# Patient Record
Sex: Male | Born: 1940
Health system: Southern US, Community
[De-identification: ages and names within clinical notes are randomized; demographics above are authoritative.]

## PROBLEM LIST (undated history)

## (undated) DIAGNOSIS — I2510 Atherosclerotic heart disease of native coronary artery without angina pectoris: Secondary | ICD-10-CM

## (undated) DIAGNOSIS — I340 Nonrheumatic mitral (valve) insufficiency: Secondary | ICD-10-CM

## (undated) DIAGNOSIS — I10 Essential (primary) hypertension: Secondary | ICD-10-CM

## (undated) DIAGNOSIS — R609 Edema, unspecified: Secondary | ICD-10-CM

## (undated) DIAGNOSIS — Z7901 Long term (current) use of anticoagulants: Secondary | ICD-10-CM

## (undated) DIAGNOSIS — E785 Hyperlipidemia, unspecified: Secondary | ICD-10-CM

## (undated) DIAGNOSIS — E669 Obesity, unspecified: Secondary | ICD-10-CM

## (undated) DIAGNOSIS — I509 Heart failure, unspecified: Secondary | ICD-10-CM

## (undated) DIAGNOSIS — I252 Old myocardial infarction: Secondary | ICD-10-CM

## (undated) DIAGNOSIS — R011 Cardiac murmur, unspecified: Secondary | ICD-10-CM

## (undated) DIAGNOSIS — I639 Cerebral infarction, unspecified: Secondary | ICD-10-CM

## (undated) HISTORY — DX: Cerebral infarction, unspecified: I63.9

## (undated) HISTORY — DX: Cardiac murmur, unspecified: R01.1

## (undated) HISTORY — DX: Long term (current) use of anticoagulants: Z79.01

## (undated) HISTORY — DX: Hyperlipidemia, unspecified: E78.5

## (undated) HISTORY — DX: Essential (primary) hypertension: I10

## (undated) HISTORY — DX: Edema, unspecified: R60.9

## (undated) HISTORY — DX: Obesity, unspecified: E66.9

## (undated) HISTORY — DX: Nonrheumatic mitral (valve) insufficiency: I34.0

## (undated) HISTORY — DX: Atherosclerotic heart disease of native coronary artery without angina pectoris: I25.10

## (undated) HISTORY — PX: KNEE SURGERY: SHX244

## (undated) HISTORY — DX: Old myocardial infarction: I25.2

## (undated) HISTORY — DX: Heart failure, unspecified: I50.9

---

## 2004-09-20 ENCOUNTER — Ambulatory Visit: Payer: Self-pay | Admitting: Family Medicine

## 2006-10-29 DIAGNOSIS — I639 Cerebral infarction, unspecified: Secondary | ICD-10-CM

## 2006-10-29 DIAGNOSIS — I252 Old myocardial infarction: Secondary | ICD-10-CM

## 2006-10-29 DIAGNOSIS — I2510 Atherosclerotic heart disease of native coronary artery without angina pectoris: Secondary | ICD-10-CM

## 2006-10-29 HISTORY — DX: Atherosclerotic heart disease of native coronary artery without angina pectoris: I25.10

## 2006-10-29 HISTORY — DX: Cerebral infarction, unspecified: I63.9

## 2006-10-29 HISTORY — DX: Old myocardial infarction: I25.2

## 2007-04-10 ENCOUNTER — Inpatient Hospital Stay (HOSPITAL_COMMUNITY): Admission: EM | Admit: 2007-04-10 | Discharge: 2007-04-17 | Payer: Self-pay | Admitting: Emergency Medicine

## 2007-04-10 HISTORY — PX: CORONARY ANGIOPLASTY WITH STENT PLACEMENT: SHX49

## 2007-04-14 ENCOUNTER — Ambulatory Visit: Payer: Self-pay | Admitting: *Deleted

## 2007-05-01 ENCOUNTER — Encounter (HOSPITAL_COMMUNITY): Admission: RE | Admit: 2007-05-01 | Discharge: 2007-07-30 | Payer: Self-pay | Admitting: Cardiology

## 2007-05-20 ENCOUNTER — Encounter: Admission: RE | Admit: 2007-05-20 | Discharge: 2007-08-18 | Payer: Self-pay | Admitting: Neurology

## 2008-11-03 IMAGING — CR DG CHEST 1V PORT
1 series · 1 of 1 positions shown · non-contrast
Comparison: none

CLINICAL DATA: Acute MI.  
 PORTABLE CHEST - 1 VIEW 04/11/07:

[view not recorded]
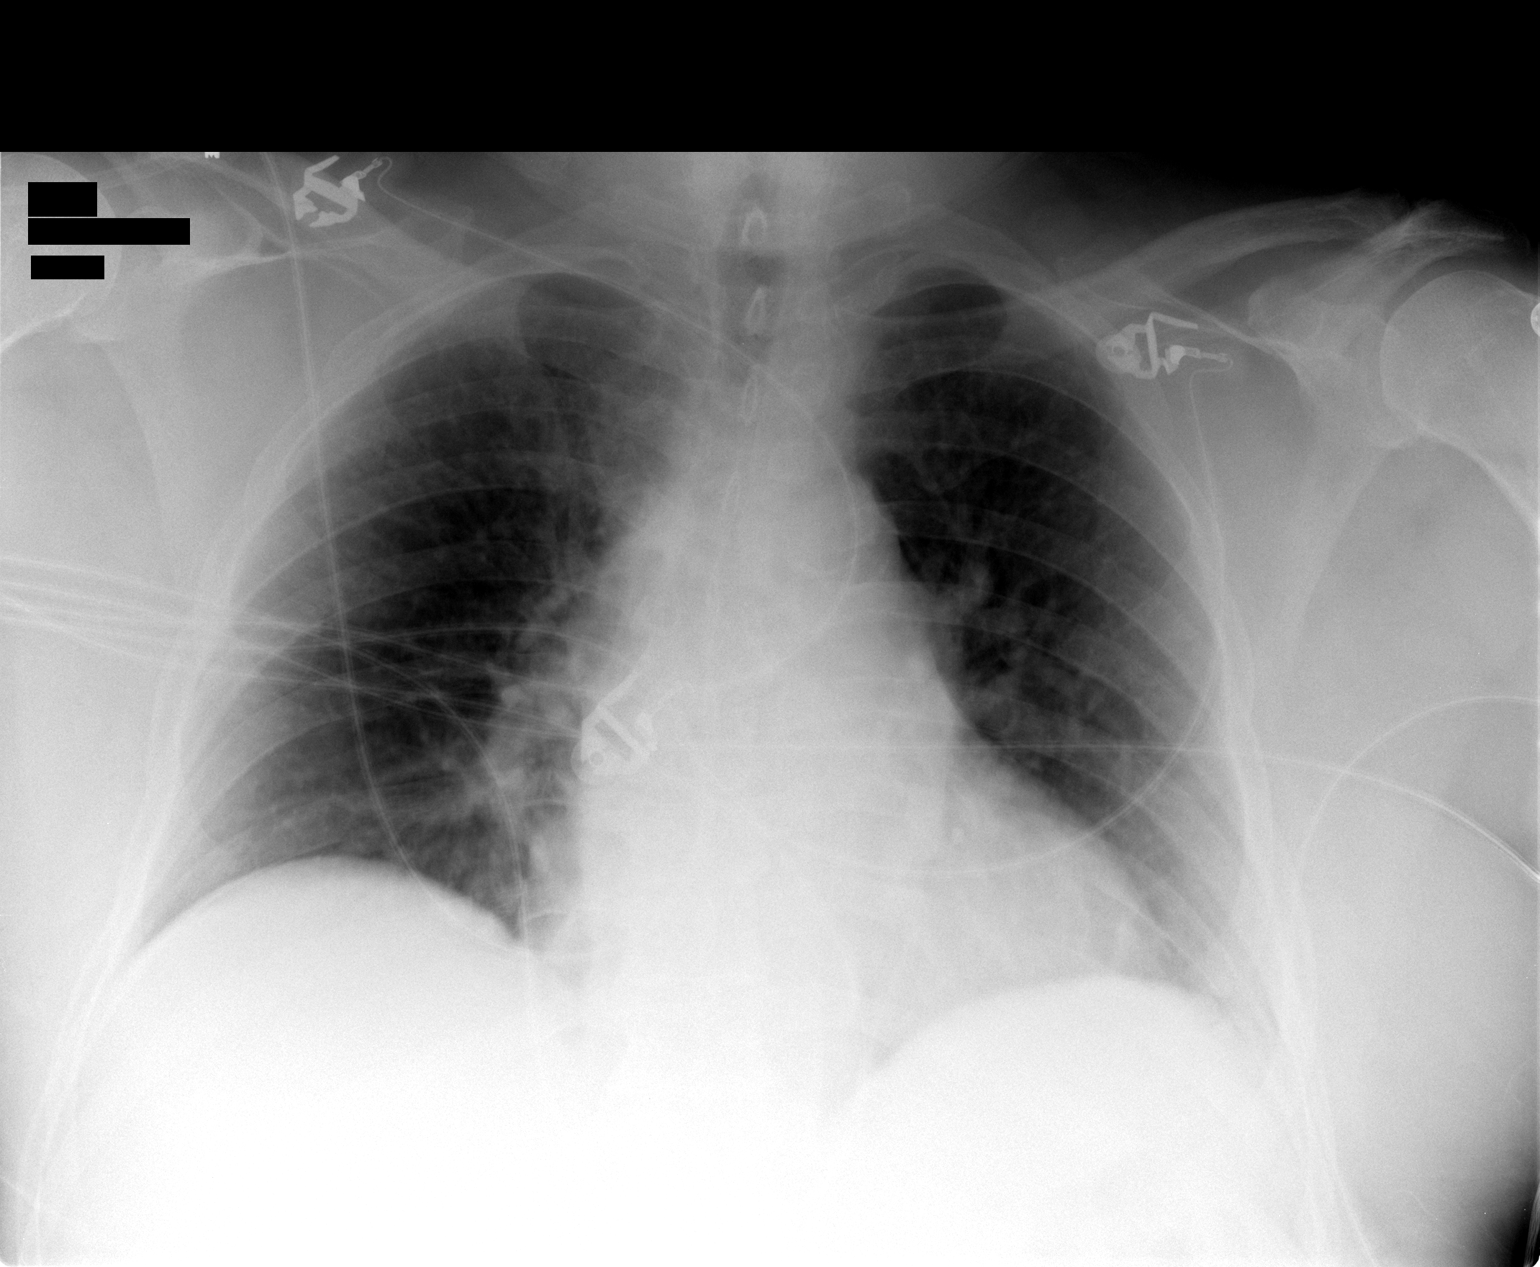

[1 of 1 positions shown; findings below may reference images not displayed]

FINDINGS: Heart size is mildly enlarged and there is mild interstitial pulmonary edema.  No focal airspace disease or effusion.  No focal bony abnormality.
IMPRESSION: Mild interstitial pulmonary edema.

## 2010-08-31 ENCOUNTER — Ambulatory Visit: Payer: Self-pay | Admitting: Cardiology

## 2010-09-06 ENCOUNTER — Telehealth (INDEPENDENT_AMBULATORY_CARE_PROVIDER_SITE_OTHER): Payer: Self-pay | Admitting: *Deleted

## 2010-09-07 ENCOUNTER — Encounter: Payer: Self-pay | Admitting: Cardiology

## 2010-09-07 ENCOUNTER — Ambulatory Visit: Payer: Self-pay

## 2010-09-07 ENCOUNTER — Encounter (HOSPITAL_COMMUNITY)
Admission: RE | Admit: 2010-09-07 | Discharge: 2010-10-28 | Payer: Self-pay | Source: Home / Self Care | Attending: Cardiology | Admitting: Cardiology

## 2010-09-07 ENCOUNTER — Ambulatory Visit: Payer: Self-pay | Admitting: Cardiology

## 2010-11-28 NOTE — Progress Notes (Signed)
Summary: Nuclear Pre-Procedure  Phone Note Outgoing Call   Call placed by: Milana Na, EMT-P,  September 06, 2010 1:47 PM Summary of Call: Left message with information on My[employer] Information Sheet (see scanned document for details).      Nuclear Med Background Indications for Stress Test: Evaluation for Ischemia, Stent Patency   History: Echo, Heart Catheterization, Myocardial Perfusion Study, Stents  History Comments: 06/08 MI-Heart Cath-Stents  2010 ECHO EF 50-55%  Symptoms: DOE, Palpitations    Nuclear Pre-Procedure Cardiac Risk Factors: CVA, Hypertension, Lipids, NIDDM  Nuclear Med Study Referring MD:  P.Jordan

## 2010-11-28 NOTE — Assessment & Plan Note (Signed)
Summary: Cardiology Nuclear Testing  Nuclear Med Background Indications for Stress Test: Evaluation for Ischemia, Stent Patency   History: Echo, Heart Catheterization, Myocardial Infarction, Stents  History Comments: .  Symptoms: DOE, Palpitations    Nuclear Pre-Procedure Cardiac Risk Factors: CVA, Family History - CAD, History of Smoking, Hypertension, Lipids, NIDDM, Obesity Caffeine/Decaff Intake: None NPO After: 7:30 PM Lungs: Clear.  IV 0.9% NS with Angio Cath: 22g     IV Site: R Antecubital IV Started by: Irean Hong, RN Chest Size (in) 42     Height (in): 65 Weight (lb): 204 BMI: 34.07 Tech Comments: Held Coreg x 24 hours  Nuclear Med Study 1 or 2 day study:  1 day     Stress Test Type:  Stress Reading MD:  Olga Millers, MD     Referring MD:  Peter Swaziland, MD Resting Radionuclide:  Technetium 28m Tetrofosmin     Resting Radionuclide Dose:  11 mCi  Stress Radionuclide:  Technetium 1m Tetrofosmin     Stress Radionuclide Dose:  33 mCi   Stress Protocol Exercise Time (min):  5:51 min     Max HR:  169 bpm     Predicted Max HR:  153 bpm  Max Systolic BP: 207 mm Hg     Percent Max HR:  110.46 %     METS: 7.0 Rate Pressure Product:  19147    Stress Test Technologist:  [employer]a [employer], CMA-N     Nuclear Technologist:  Domenic Polite, CNMT  Rest Procedure  Myocardial perfusion imaging was performed at rest 45 minutes following the intravenous administration of Technetium 52m Tetrofosmin.  Stress Procedure  The patient exercised for 5:51.  The patient stopped due to fatigue and denied any chest pain.  There were ST-T wave changes, frequent PVC's with occasional couplets and occasional PAC's.  He had a mild hypertensive response to exercise, 207/81.  Technetium 70m Tetrofosmin was injected at peak exercise and myocardial perfusion imaging was performed after a brief delay.  QPS Raw Data Images:  There is no interference from other nuclear activity. Stress Images:  There  is decreased uptake in the anterior wall, septum and apex. Rest Images:  There is decreased uptake in the anterior wall, septum and apex. Subtraction (SDS):  There is a fixed defect that is most consistent with a previous infarction. Transient Ischemic Dilatation:  1.05  (Normal <1.22)  Lung/Heart Ratio:  0.37  (Normal <0.45)  Quantitative Gated Spect Images QGS EDV:  163 ml QGS ESV:  87 ml QGS EF:  46 % QGS cine images:  Akinesis of the anterior wall, septum and apex; evidence of LVE.   Overall Impression  Exercise Capacity: Fair exercise capacity. BP Response: Hypertensive blood pressure response. Clinical Symptoms: No chest pain ECG Impression: Insignificant upsloping ST segment depression. Overall Impression: Abnormal stress nuclear study with prior anterior, septal and apical infarct; no ischemia.  Appended Document: Cardiology Nuclear Testing copy sent to Dr.Jordan  Appended Document: Cardiology Nuclear Testing copy sent o Dr.Jordan

## 2011-03-13 NOTE — Cardiovascular Report (Signed)
[name], [name]                ACCOUNT NO.:  [id number]   MEDICAL RECORD NO.:  [id number]          PATIENT TYPE:  INP   LOCATION:  2913                         FACILITY:  [facility]   PHYSICIAN:  [name], M.D.  DATE OF BIRTH:  1941-09-23   DATE OF PROCEDURE:  04/10/2007  DATE OF DISCHARGE:                            CARDIAC CATHETERIZATION   INDICATION FOR THE PROCEDURE:  A 70 year old white male who presented  with a syncopal episode.  His ECG showed marked ST-elevation in the  anterior leads, particularly marked ST-elevation in leads V2 through V5.  He did have Q waves as well.  The patient denied any chest pain or  shortness of breath.  Given his acute ECG findings, it was recommended  he undergo emergent cardiac catheterization.   PROCEDURES:  Left heart catheterization, coronary and left ventricular  angiography, intracoronary stenting of the mid LAD.   ACCESS:  Via the right femoral artery using standard Seldinger  technique.   EQUIPMENT USED:  6-French 4-cm right and left Judkins catheters, 6-  French pigtail catheter, 6-French arterial sheath, 6FL 3.5 mm guide, a  0.014 Prowater wire, a 3.0 x 15-mm Maverick balloon, a 3.0 x 32-mm Taxus  stent, and a 3.0 x 20-mm Quantum Maverick balloon.   TOTAL CONTRAST USED:  250 mL of Omnipaque   MEDICATIONS:  1. Plavix 600 mg p.o.  2. Lopressor 5 mg IV x3.  3. Angiomax bolus at 0.75 mg/kg with a continuous infusion of 1.75      mg/kg per hour.  Subsequent ACT was 295 seconds.  4. Nitroglycerin 200 mcg intracoronary x1.  5. Pepcid 20 mg IV.   HEMODYNAMIC DATA:  Aortic pressure is 116/71 with a mean of 91.  Left  ventricle pressure is 114 with an EDP of 26 mmHg.   ANGIOGRAPHIC DATA:  The left coronary artery arises and distributes  normally.  The left main coronary artery is normal.   The left anterior descending artery is acutely occluded at the mid  vessel following the takeoff of the first diagonal and first septal  perforator branches.  There is no collateral flow.   There is a large intermediate vessel which has 20% narrowing proximally.   The left circumflex coronary artery is relatively small and appears  normal.   The right coronary artery is large dominant vessel.  It has 30%  narrowing in the proximal and mid vessel.   We proceeded at this point with acute intervention of the mid LAD.  After the patient was anticoagulated and loaded on Plavix, we proceeded  to cross this lesion.  We were unable to cross with a high-torque floppy  wire.  However, a Prowater wire crossed easily and was positioned in the  distal LAD.  We predilated the area of occlusion with a 3.0 x 15-mm  Maverick balloon up to 6 atmospheres.  This resulted in reperfusion of  the entire LAD.  It demonstrated there was a long segment of disease in  the mid LAD with good distal runoff and the LAD wraps around the apex.  We next placed a  3.0 x 32-mm Taxus stent covering the entire segment of  the mid LAD.  This was deployed at 9 atmospheres and then 12 atmospheres  with the stent balloon.  We then postdilated using a 3.0 x 20-mm Quantum  Maverick balloon up to 14 atmospheres x2.  This resulted in excellent  angiographic result with 0% residual stenosis in the LAD and TIMI grade  3 flow throughout the vessel.  There was a small second diagonal branch  which remained occluded at the end of the procedure.  The patient  remained hemodynamically stable throughout.  He had no arrhythmias.  He  remained asymptomatic.   At the end of procedure we did perform left ventricular angiography.  This demonstrated akinesia of the mid to distal anterior wall and  dyskinesia of the apex.  There was a filling defect in the apex  consistent with a mural thrombus.  Overall ejection fraction is  moderately reduced with ejection fraction of 35-40%.  There was no  mitral insufficiency.   FINAL INTERPRETATION:  1. Single-vessel occlusive  atherosclerotic coronary artery disease.  2. Moderate left ventricular dysfunction.  3. Apical mural thrombus.  4. Successful acute intracoronary stenting of the mid left anterior      descending artery.   The patient be transferred to the coronary intensive care unit.  He will  be treated aggressively with aspirin, Plavix, beta-blocker and ACE  inhibitor, and statin therapy.  It is unclear at this point the timing  of the onset of his myocardial infarction but will cycle cardiac enzymes  and ECG.  Will need an echocardiogram to follow up on his mural thrombus  and will start him on Lovenox in the morning.           ______________________________  [name], M.D.     PMJ/MEDQ  D:  04/10/2007  T:  04/11/2007  Job:  [id number]
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
## 2011-03-13 NOTE — Consult Note (Signed)
[name], [name]                ACCOUNT NO.:  [id number]   MEDICAL RECORD NO.:  [id number]          PATIENT TYPE:  INP   LOCATION:  2913                         FACILITY:  [facility]   PHYSICIAN:  Alfonse Alpers. Gegick, M.D.DATE OF BIRTH:  03/25/1941   DATE OF CONSULTATION:  04/11/2007  DATE OF DISCHARGE:                                 CONSULTATION   HISTORY:  This is a 70 year old man who had an episode of syncope.  He  was then found to have an anterior wall myocardial infarction and also a  history of a previous stroke.  These were all new events including a  diagnosis of type 2 diabetes.   He has a history of being active but not physically strenuously active  in the past.  He does not see physicians.  He essentially has no  symptoms either for his diabetes.  There is no history of numbness in  his feet.  He has no history of chest pain when he had his heart attack  and essentially very minimal symptoms associated with his  cerebrovascular accident.  His blood sugars have been a 200 to 300 range  and his A1c is elevated at 8.8%.   PAST MEDICAL HISTORY:  Essentially negative.  He has had knee surgery 20  years ago and 40 years ago.   PERSONAL HISTORY:  He used to smoke for a few years between 1970 and  1975 and he denies excessive alcoholic intake.  No history of allergies.   REVIEW OF SYSTEMS:  GENERAL:  His weight has been staying about the  same.  CARDIORESPIRATORY:  See above.  GI:  No complaints.   PHYSICAL EXAMINATION:  GENERAL:  This is a well-developed man who is  moderately obese.  SKIN:  His skin is essentially normal.  HEENT:  Head is normocephalic.  LUNGS:  Clear.  CARDIOVASCULAR:  Rhythm is regular.  ABDOMEN:  Soft.  No masses are present.  EXTREMITIES:  No pedal edema is present.  Peripheral pulses are palpable  bilaterally.   IMPRESSION:  1. Arteriosclerotic heart disease.  2. Cerebrovascular accident.  3. Diabetes mellitus type 2.   DISCUSSION:  At this  time, insulin will be given with each meal time.  I  anticipate control his diabetes should be quite easy and hopefully we  can switch him over to oral agents.  Ideally, we would want to use  pioglitazone  followed by Januvia for control and then added to this would be  metformin.  In the event that he has significant heart failure, the  Actos would not be appropriate at this time.   Thank you for the opportunity in seeing this patient.           ______________________________  Alfonse Alpers. Dagoberto Ligas, M.D.     CGG/MEDQ  D:  04/11/2007  T:  04/12/2007  Job:  [id number]
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
## 2011-03-21 ENCOUNTER — Other Ambulatory Visit: Payer: Self-pay | Admitting: *Deleted

## 2011-03-21 MED ORDER — RAMIPRIL 2.5 MG PO CAPS: 2.5000 mg | ORAL_CAPSULE | Freq: Every day | ORAL | Status: DC

## 2011-03-21 MED ORDER — CLOPIDOGREL BISULFATE 75 MG PO TABS: 75.0000 mg | ORAL_TABLET | Freq: Every day | ORAL | Status: DC

## 2011-03-21 NOTE — Telephone Encounter (Signed)
escribe medication per fax request  

## 2011-07-18 ENCOUNTER — Other Ambulatory Visit: Payer: Self-pay | Admitting: *Deleted

## 2011-07-18 MED ORDER — ATORVASTATIN CALCIUM 80 MG PO TABS: 80.0000 mg | ORAL_TABLET | Freq: Every day | ORAL | Status: DC

## 2011-07-18 MED ORDER — CARVEDILOL 12.5 MG PO TABS: 12.5000 mg | ORAL_TABLET | Freq: Two times a day (BID) | ORAL | Status: DC

## 2011-07-18 NOTE — Telephone Encounter (Signed)
escribe medication per fax request  

## 2011-08-15 LAB — BASIC METABOLIC PANEL
CO2: 30
Calcium: 8.5
Creatinine, Ser: 0.88
GFR calc Af Amer: >60
GFR calc non Af Amer: >60
Sodium: 136

## 2011-08-15 LAB — PROTIME-INR
INR: 1.3
INR: 1.3
INR: 1.6 — ABNORMAL HIGH
INR: 1.6 — ABNORMAL HIGH
INR: 1.9 — ABNORMAL HIGH
Prothrombin Time: 16.3 — ABNORMAL HIGH
Prothrombin Time: 17 — ABNORMAL HIGH
Prothrombin Time: 19.3 — ABNORMAL HIGH
Prothrombin Time: 19.8 — ABNORMAL HIGH
Prothrombin Time: 22.3 — ABNORMAL HIGH

## 2011-08-15 LAB — CBC
HCT: 35.7 — ABNORMAL LOW
Hemoglobin: 11.8 — ABNORMAL LOW
Hemoglobin: 12 — ABNORMAL LOW
MCHC: 33.7
MCHC: 34.2
MCV: 87.9
Platelets: 344
RBC: 3.92 — ABNORMAL LOW
RBC: 4.06 — ABNORMAL LOW
RDW: 13.1
WBC: 10
WBC: 7.2

## 2011-08-16 LAB — COMPREHENSIVE METABOLIC PANEL
AST: 46 — ABNORMAL HIGH
Albumin: 2.9 — ABNORMAL LOW
Alkaline Phosphatase: 44
BUN: 11
Chloride: 103
Creatinine, Ser: 0.82
GFR calc Af Amer: >60
Potassium: 3.5
Total Protein: 5.6 — ABNORMAL LOW

## 2011-08-16 LAB — CBC
HCT: 39.2
Platelets: 276
RDW: 13.2
WBC: 10.4

## 2011-08-16 LAB — CARDIAC PANEL(CRET KIN+CKTOT+MB+TROPI)
CK, MB: 12 — ABNORMAL HIGH
Relative Index: 4.1 — ABNORMAL HIGH
Total CK: 361 — ABNORMAL HIGH
Troponin I: 13.55

## 2011-08-16 LAB — LIPID PANEL
HDL: 33 — ABNORMAL LOW
LDL Cholesterol: 90
Triglycerides: 71
VLDL: 14

## 2011-08-16 LAB — BASIC METABOLIC PANEL
Chloride: 101
Potassium: 3.5
Sodium: 136

## 2011-08-16 LAB — PROTIME-INR
INR: 1.3
Prothrombin Time: 16.6 — ABNORMAL HIGH

## 2011-08-16 LAB — B-NATRIURETIC PEPTIDE (CONVERTED LAB): Pro B Natriuretic peptide (BNP): 236 — ABNORMAL HIGH

## 2011-08-16 LAB — HOMOCYSTEINE: Homocysteine: 7.6

## 2011-08-16 LAB — HEMOGLOBIN A1C: Hgb A1c MFr Bld: 9.4 — ABNORMAL HIGH

## 2011-10-01 ENCOUNTER — Other Ambulatory Visit: Payer: Self-pay | Admitting: Cardiology

## 2011-10-15 ENCOUNTER — Other Ambulatory Visit: Payer: Self-pay | Admitting: Cardiology

## 2011-12-07 ENCOUNTER — Other Ambulatory Visit: Payer: Self-pay | Admitting: Cardiology

## 2012-02-14 ENCOUNTER — Other Ambulatory Visit: Payer: Self-pay | Admitting: Cardiology

## 2012-02-14 ENCOUNTER — Telehealth: Payer: Self-pay | Admitting: Cardiology

## 2012-02-14 MED ORDER — CARVEDILOL 12.5 MG PO TABS: 12.5000 mg | ORAL_TABLET | Freq: Two times a day (BID) | ORAL | Status: DC

## 2012-02-14 MED ORDER — CLOPIDOGREL BISULFATE 75 MG PO TABS: 75.0000 mg | ORAL_TABLET | Freq: Every day | ORAL | Status: DC

## 2012-02-14 MED ORDER — ATORVASTATIN CALCIUM 80 MG PO TABS: 80.0000 mg | ORAL_TABLET | Freq: Every day | ORAL | Status: DC

## 2012-02-14 NOTE — Telephone Encounter (Signed)
Received fax from [facility] to renew atorvastatin and carvedilol.Patient called no answer,left message on personal voice mail advised needs to schedule appointment with Dr.Jordan.Medications can be renewed for 1 month.

## 2012-02-14 NOTE — Telephone Encounter (Signed)
New Problem:     Patient needing refills of his carvedilol (COREG) 12.5 MG tablet and atorvastatin (LIPITOR) 80 MG tablet.

## 2012-02-14 NOTE — Telephone Encounter (Signed)
Refills given to [facility] for approval,patient has not been in recently

## 2012-02-18 ENCOUNTER — Telehealth: Payer: Self-pay

## 2012-02-18 NOTE — Telephone Encounter (Signed)
Patient called no answer.Left message on personal voice mail received refill request from [facility] for atorvastatin,carvedilol.Okay to refill,needs to make appointment before refilling again.Left message for patient to call back to schedule appointment.

## 2012-02-22 ENCOUNTER — Encounter: Payer: Self-pay | Admitting: *Deleted

## 2012-02-22 ENCOUNTER — Ambulatory Visit: Payer: Self-pay | Admitting: Nurse Practitioner

## 2012-02-25 ENCOUNTER — Ambulatory Visit (INDEPENDENT_AMBULATORY_CARE_PROVIDER_SITE_OTHER): Payer: BC Managed Care – PPO | Admitting: Nurse Practitioner

## 2012-02-25 ENCOUNTER — Encounter: Payer: Self-pay | Admitting: Nurse Practitioner

## 2012-02-25 VITALS — BP 148/70 | HR 65 | Ht 65.0 in | Wt 221.0 lb

## 2012-02-25 DIAGNOSIS — I2510 Atherosclerotic heart disease of native coronary artery without angina pectoris: Secondary | ICD-10-CM | POA: Insufficient documentation

## 2012-02-25 MED ORDER — ATORVASTATIN CALCIUM 80 MG PO TABS: 80.0000 mg | ORAL_TABLET | Freq: Every day | ORAL | Status: DC

## 2012-02-25 MED ORDER — NITROGLYCERIN 0.4 MG SL SUBL: 0.4000 mg | SUBLINGUAL_TABLET | SUBLINGUAL | Status: DC | PRN

## 2012-02-25 MED ORDER — CARVEDILOL 12.5 MG PO TABS: 12.5000 mg | ORAL_TABLET | Freq: Two times a day (BID) | ORAL | Status: DC

## 2012-02-25 MED ORDER — CLOPIDOGREL BISULFATE 75 MG PO TABS: 75.0000 mg | ORAL_TABLET | Freq: Every day | ORAL | Status: DC

## 2012-02-25 NOTE — Patient Instructions (Signed)
I encourage you to get back on track with diet/exercise/weight loss  We will see you in 6 months  Call the [facility] office at [phone] if you have any questions, problems or concerns.

## 2012-02-25 NOTE — Progress Notes (Signed)
[name] Date of Birth: 06-11-1941 Medical Record #[id number]  History of Present Illness: [name] is seen back today for a follow up visit. He is seen for Dr. Swaziland. He has not been seen since November of 2011. Has known CAD with prior MI, DES to the LAD, stroke, HTN and DM. His last stress test was in 2011 which showed an EF of 46% with an anteroseptal apical infarct. There was no ischemia.   He comes in today. He is here alone. He needs medicines refilled. He says he is doing well. No chest pain. Not short of breath. Has been working about 12 hours day. Not exercising. Planning on retiring in May. Has seen Dr. Sharl Ma recently and had a good check up. Blood pressure there was good. AIC was around 5. He does remain obese and has gained more weight. He says he is planning on getting on track with his health once he retires. He does need his medicines refilled.   Current Outpatient Prescriptions on File Prior to Visit  Medication Sig Dispense Refill  . ALTACE 2.5 MG capsule TAKE (1) CAPSULE DAILY.  30 each  2  . aspirin 325 MG tablet Take 325 mg by mouth daily.      Marland Kitchen atorvastatin (LIPITOR) 80 MG tablet Take 1 tablet (80 mg total) by mouth daily.  30 tablet  0  . carvedilol (COREG) 12.5 MG tablet Take 1 tablet (12.5 mg total) by mouth 2 (two) times daily.  60 tablet  0  . clopidogrel (PLAVIX) 75 MG tablet Take 1 tablet (75 mg total) by mouth daily.  30 tablet  0  . metFORMIN (GLUCOPHAGE) 500 MG tablet Take 500 mg by mouth 2 (two) times daily with a meal.      . nitroGLYCERIN (NITROSTAT) 0.4 MG SL tablet Place 0.4 mg under the tongue every 5 (five) minutes as needed.        No Known Allergies  Past Medical History  Diagnosis Date  . Coronary artery disease 2008    Prior anterior MI (with syncope as the initial presentation) with single vessel LAD occlusive artherosclerotic CAD -- post stent of the mid LAD  . Diabetes mellitus   . Hypertension   . Hyperlipidemia   . Myocardial  infarction 2008  . Obesity   . CVA (cerebral vascular accident) 2008    at the time of his MI  . CHF (congestive heart failure)     past echo in 2010 showed an EF of 50 to 55% with mild AS and mild mitral insufficiency  . Heart murmur   . Mitral insufficiency   . Edema   . Long term current use of anticoagulant     previously on coumadin therapy.     Past Surgical History  Procedure Date  . Coronary angioplasty with stent placement 04/10/2007    with drug-eluting stent placement of the mid LAD  . Knee surgery     History  Smoking status  . Not on file  Smokeless tobacco  . Not on file    History  Alcohol Use No    History reviewed. No pertinent family history.  Review of Systems: The review of systems is per the HPI.  All other systems were reviewed and are negative.  Physical Exam: Ht 5\' 5"  (1.651 m)  Wt 221 lb (100.245 kg)  BMI 36.78 kg/m2 Patient is very pleasant and in no acute distress. He is obese and has gained 20 pounds. Skin is warm  and dry. Color is normal.  HEENT is unremarkable. Normocephalic/atraumatic. PERRL. Sclera are nonicteric. Neck is supple. No masses. No JVD. Lungs are clear. Cardiac exam shows a regular rate and rhythm. Abdomen is obese but soft. Extremities are without edema. Gait and ROM are intact. No gross neurologic deficits noted.  LABORATORY DATA:   Assessment / Plan:

## 2012-02-25 NOTE — Assessment & Plan Note (Signed)
Patient presents with known CAD, prior MI with stenting of the LAD. Last Myoview was in November of 2011. Doing well clinically but needs to work on risk factor modification. I have refilled his medicines today. He has had his labs with Dr. Sharl Ma. We will see him back in about 6 months. Patient is agreeable to this plan and will call if any problems develop in the interim.

## 2012-03-17 ENCOUNTER — Other Ambulatory Visit: Payer: Self-pay | Admitting: Cardiology

## 2012-03-17 MED ORDER — RAMIPRIL 2.5 MG PO CAPS: 2.5000 mg | ORAL_CAPSULE | Freq: Every day | ORAL | Status: DC

## 2012-10-01 ENCOUNTER — Encounter: Payer: Self-pay | Admitting: Cardiology

## 2012-10-23 ENCOUNTER — Other Ambulatory Visit: Payer: Self-pay | Admitting: *Deleted

## 2012-10-23 MED ORDER — RAMIPRIL 2.5 MG PO CAPS: 2.5000 mg | ORAL_CAPSULE | Freq: Every day | ORAL | Status: DC

## 2013-03-25 ENCOUNTER — Other Ambulatory Visit: Payer: Self-pay | Admitting: *Deleted

## 2013-03-25 MED ORDER — CARVEDILOL 12.5 MG PO TABS: 12.5000 mg | ORAL_TABLET | Freq: Two times a day (BID) | ORAL | Status: DC

## 2013-03-25 MED ORDER — ATORVASTATIN CALCIUM 80 MG PO TABS: 80.0000 mg | ORAL_TABLET | Freq: Every day | ORAL | Status: DC

## 2013-03-25 MED ORDER — CLOPIDOGREL BISULFATE 75 MG PO TABS: 75.0000 mg | ORAL_TABLET | Freq: Every day | ORAL | Status: DC

## 2013-03-25 NOTE — Telephone Encounter (Signed)
S/w pt to schedule ov for med refills Lawson Fiscal will see pt in 6/16 I sent in 3 scripts to [facility] with no refills pt aware if doesn't keep ov will get no more refills

## 2013-04-13 ENCOUNTER — Encounter: Payer: Self-pay | Admitting: Nurse Practitioner

## 2013-04-13 ENCOUNTER — Ambulatory Visit (INDEPENDENT_AMBULATORY_CARE_PROVIDER_SITE_OTHER): Payer: BC Managed Care – PPO | Admitting: Nurse Practitioner

## 2013-04-13 VITALS — BP 120/58 | HR 60 | Ht 65.0 in | Wt 218.4 lb

## 2013-04-13 DIAGNOSIS — I2510 Atherosclerotic heart disease of native coronary artery without angina pectoris: Secondary | ICD-10-CM

## 2013-04-13 MED ORDER — ATORVASTATIN CALCIUM 80 MG PO TABS: 80.0000 mg | ORAL_TABLET | Freq: Every day | ORAL | Status: DC

## 2013-04-13 MED ORDER — RAMIPRIL 2.5 MG PO CAPS: 2.5000 mg | ORAL_CAPSULE | Freq: Every day | ORAL | Status: DC

## 2013-04-13 MED ORDER — CLOPIDOGREL BISULFATE 75 MG PO TABS: 75.0000 mg | ORAL_TABLET | Freq: Every day | ORAL | Status: DC

## 2013-04-13 MED ORDER — CARVEDILOL 12.5 MG PO TABS: 12.5000 mg | ORAL_TABLET | Freq: Two times a day (BID) | ORAL | Status: DC

## 2013-04-13 MED ORDER — NITROGLYCERIN 0.4 MG SL SUBL: 0.4000 mg | SUBLINGUAL_TABLET | SUBLINGUAL | Status: DC | PRN

## 2013-04-13 NOTE — Patient Instructions (Addendum)
I have refilled your medicines  Get active!  See Dr. Swaziland in 6 months  Call the [facility] office at [phone] if you have any questions, problems or concerns.

## 2013-04-13 NOTE — Progress Notes (Signed)
[name] Date of Birth: Jun 09, 1941 Medical Record #[id number]  History of Present Illness: [name] is seen back today for a follow up visit. He is seen for Dr. Swaziland. Not been seen here since April of 2013. He has known CAD with prior MI in 2008 (presented with syncope), DES to the LAD, stroke, HTN and DM. Last stress test was in 2011 showing an EF of 46% with an anteroseptal apical infarct. No ischemia. EF recovered nicely per follow up echo in 2010.   Seen last April to get his medicines refilled. Remains on Plavix.  Comes back today. He is here alone. Doing ok. No problems noted. Needs his medicines refilled. Sees Dr. Sharl Ma for his PCP. A1C is 5.6. Feels ok. Energy level is good. Not as active as he should be. [place] and while he did more walking, he did fine. No chest pain. No syncope. Tolerating his medicines. Knows that he could be doing a better job taking care of himself.     Medication List       These changes are accurate as of: 04/13/2013 11:02 AM. If you have any questions, ask your nurse or doctor.          TAKE these medications       aspirin 81 MG tablet  Take 81 mg by mouth daily.     atorvastatin 80 MG tablet  Commonly known as:  LIPITOR  Take 1 tablet (80 mg total) by mouth daily.     carvedilol 12.5 MG tablet  Commonly known as:  COREG  Take 1 tablet (12.5 mg total) by mouth 2 (two) times daily.     CENTRUM SILVER PO  Take by mouth daily.     clopidogrel 75 MG tablet  Commonly known as:  PLAVIX  Take 1 tablet (75 mg total) by mouth daily.     metFORMIN 500 MG tablet  Commonly known as:  GLUCOPHAGE  Take 500 mg by mouth 2 (two) times daily with a meal.     nitroGLYCERIN 0.4 MG SL tablet  Commonly known as:  NITROSTAT  Place 1 tablet (0.4 mg total) under the tongue every 5 (five) minutes as needed.     pioglitazone 45 MG tablet  Commonly known as:  ACTOS  Take 45 mg by  mouth daily.     ramipril 2.5 MG capsule  Commonly known as:  ALTACE  Take 1 capsule (2.5 mg total) by mouth daily.         No Known Allergies  Past Medical History  Diagnosis Date  . Coronary artery disease 2008    Prior anterior MI (with syncope as the initial presentation) with single vessel LAD occlusive artherosclerotic CAD -- post stent of the mid LAD  . Diabetes mellitus   . Hypertension   . Hyperlipidemia   . Myocardial infarction 2008  . Obesity   . CVA (cerebral vascular accident) 2008    at the time of his MI  . CHF (congestive heart failure)     past echo in 2010 showed an EF of 50 to 55% with mild AS and mild mitral insufficiency  . Heart murmur   . Mitral insufficiency   . Edema   . Long term current use of anticoagulant     previously on coumadin therapy.     Past Surgical History  Procedure Laterality Date  . Coronary angioplasty with stent placement  04/10/2007    with drug-eluting stent placement of the mid LAD  . Knee surgery      History  Smoking status  . Former Smoker  . Quit date: 03/30/1975  Smokeless tobacco  . Not on file    History  Alcohol Use No    History reviewed. No pertinent family history.  Review of Systems: The review of systems is per the HPI.  All other systems were reviewed and are negative.  Physical Exam: BP 120/58  Pulse 60  Ht 5\' 5"  (1.651 m)  Wt 218 lb 6.4 oz (99.066 kg)  BMI 36.34 kg/m2 Patient is very pleasant and in no acute distress. He remains obese. Skin is warm and dry. Color is normal.  HEENT is unremarkable. Normocephalic/atraumatic. PERRL. Sclera are nonicteric. Neck is supple. No masses. No JVD. Lungs are clear. Cardiac exam shows a regular rate and rhythm. Abdomen is soft. Extremities are without edema. Gait and ROM are intact. No gross neurologic deficits noted.  LABORATORY DATA: Done by his PCP.  Lab Results  Component Value Date   WBC 7.2 04/16/2007   HGB 12.0* 04/16/2007   HCT 35.7*  04/16/2007   PLT 344 04/16/2007   GLUCOSE 179* 04/15/2007   CHOL  Value: 137        ATP III CLASSIFICATION:  <200     mg/dL   Desirable  782-956  mg/dL   Borderline High  >=213    mg/dL   High 0/86/5784   TRIG 71 04/11/2007   HDL 33* 04/11/2007   LDLCALC  Value: 90        Total Cholesterol/HDL:CHD Risk Coronary Heart Disease Risk Table                     Men   Women  1/2 Average Risk   3.4   3.3 04/11/2007   ALT 26 04/11/2007   AST 46* 04/11/2007   NA 136 04/15/2007   K 3.8 04/15/2007   CL 101 04/15/2007   CREATININE 0.88 04/15/2007   BUN 10 04/15/2007   CO2 30 04/15/2007   INR 1.9* 04/17/2007   HGBA1C  Value: 8.8 (NOTE)   The ADA recommends the following therapeutic goals for glycemic   control related to Hgb A1C measurement:   Goal of Therapy:   < 7.0% Hgb A1C   Action Suggested:  > 8.0% Hgb A1C   Ref:  Diabetes Care, 22, Suppl. 1, 1999* 04/11/2007   HGBA1C  Value: 9.4 (NOTE)   The ADA recommends the following therapeutic goals for glycemic   control related to Hgb A1C measurement:   Goal of Therapy:   < 7.0% Hgb A1C   Action Suggested:  > 8.0% Hgb A1C   Ref:  Diabetes Care, 22, Suppl. 1, 1999* 04/11/2007     Assessment / Plan: 1. CAD - doing well clinically. No symptoms at present. Needs to work on CV risk factor modification. Would consider arranging stress testing on return visit. His medicines are refilled today. See him back in 6 months.  2. HTN - BP looks good.  3. Obesity - continues to be an issue. We have discussed this in detail today.   Patient is agreeable to this plan and will call if any problems develop in the interim.   Rosalio Macadamia, RN, ANP-C [facility] [address] [place], [name]  [address]

## 2013-12-09 ENCOUNTER — Other Ambulatory Visit: Payer: Self-pay

## 2013-12-09 MED ORDER — CLOPIDOGREL BISULFATE 75 MG PO TABS: 75.0000 mg | ORAL_TABLET | Freq: Every day | ORAL | Status: DC

## 2014-01-13 ENCOUNTER — Other Ambulatory Visit: Payer: Self-pay

## 2014-01-13 MED ORDER — RAMIPRIL 2.5 MG PO CAPS: 2.5000 mg | ORAL_CAPSULE | Freq: Every day | ORAL | Status: DC

## 2014-01-13 MED ORDER — ATORVASTATIN CALCIUM 80 MG PO TABS: 80.0000 mg | ORAL_TABLET | Freq: Every day | ORAL | Status: DC

## 2014-01-13 MED ORDER — CARVEDILOL 12.5 MG PO TABS: 12.5000 mg | ORAL_TABLET | Freq: Two times a day (BID) | ORAL | Status: DC

## 2014-04-23 ENCOUNTER — Ambulatory Visit (INDEPENDENT_AMBULATORY_CARE_PROVIDER_SITE_OTHER): Payer: Medicare Other | Admitting: Cardiology

## 2014-04-23 ENCOUNTER — Encounter: Payer: Self-pay | Admitting: Cardiology

## 2014-04-23 VITALS — BP 150/62 | HR 60 | Ht 65.0 in | Wt 216.6 lb

## 2014-04-23 DIAGNOSIS — E1169 Type 2 diabetes mellitus with other specified complication: Secondary | ICD-10-CM

## 2014-04-23 DIAGNOSIS — E669 Obesity, unspecified: Secondary | ICD-10-CM | POA: Insufficient documentation

## 2014-04-23 DIAGNOSIS — I2510 Atherosclerotic heart disease of native coronary artery without angina pectoris: Secondary | ICD-10-CM

## 2014-04-23 DIAGNOSIS — I219 Acute myocardial infarction, unspecified: Secondary | ICD-10-CM | POA: Insufficient documentation

## 2014-04-23 DIAGNOSIS — E785 Hyperlipidemia, unspecified: Secondary | ICD-10-CM

## 2014-04-23 DIAGNOSIS — E119 Type 2 diabetes mellitus without complications: Secondary | ICD-10-CM

## 2014-04-23 DIAGNOSIS — I10 Essential (primary) hypertension: Secondary | ICD-10-CM | POA: Insufficient documentation

## 2014-04-23 MED ORDER — CARVEDILOL 12.5 MG PO TABS: 12.5000 mg | ORAL_TABLET | Freq: Two times a day (BID) | ORAL | Status: DC

## 2014-04-23 MED ORDER — CLOPIDOGREL BISULFATE 75 MG PO TABS: 75.0000 mg | ORAL_TABLET | Freq: Every day | ORAL | Status: DC

## 2014-04-23 MED ORDER — ATORVASTATIN CALCIUM 80 MG PO TABS: 80.0000 mg | ORAL_TABLET | Freq: Every day | ORAL | Status: DC

## 2014-04-23 NOTE — Patient Instructions (Signed)
Continue your current therapy  We will get your lab results from Dr. Sharl Ma.  We will schedule you for a nuclear stress test

## 2014-04-23 NOTE — Progress Notes (Signed)
[name] Date of Birth: 05/01/1941 Medical Record #[id number]  History of Present Illness: [name] is seen back today for a follow up visit.  He has known CAD with prior MI in 2008 (presented with syncope), DES to the LAD, stroke, HTN and DM. Last stress test was in Nov 2011 showing an EF of 46% with an anteroseptal apical infarct. No ischemia. EF recovered nicely per follow up echo in 2010.   On follow up today he reports he is doing well. Took a recent trip to [place] and did a lot of walking without limitation.  No problems noted.  Sees Dr. Sharl MaKerr for his DM. A1C is 5.7-6. Feels ok. Energy level is good.  No chest pain. No syncope. Tolerating his medicines.     Medication List       This list is accurate as of: 04/23/14 10:39 AM.  Always use your most recent med list.               aspirin 81 MG tablet  Take 81 mg by mouth daily.     atorvastatin 80 MG tablet  Commonly known as:  LIPITOR  Take 1 tablet (80 mg total) by mouth daily.     carvedilol 12.5 MG tablet  Commonly known as:  COREG  Take 1 tablet (12.5 mg total) by mouth 2 (two) times daily.     CENTRUM SILVER PO  Take by mouth daily.     clopidogrel 75 MG tablet  Commonly known as:  PLAVIX  Take 1 tablet (75 mg total) by mouth daily.     metFORMIN 500 MG tablet  Commonly known as:  GLUCOPHAGE  Take 500 mg by mouth 2 (two) times daily with a meal.     nitroGLYCERIN 0.4 MG SL tablet  Commonly known as:  NITROSTAT  Place 1 tablet (0.4 mg total) under the tongue every 5 (five) minutes as needed.     pioglitazone 45 MG tablet  Commonly known as:  ACTOS  Take 45 mg by mouth daily.     ramipril 2.5 MG capsule  Commonly known as:  ALTACE  Take 1 capsule (2.5 mg total) by mouth daily.         No Known Allergies  Past Medical History  Diagnosis Date  . Coronary artery disease 2008    Prior anterior MI (with syncope as the initial presentation) with single vessel LAD occlusive artherosclerotic CAD  -- post stent of the mid LAD  . Diabetes mellitus   . Hypertension   . Hyperlipidemia   . Myocardial infarction, old 2008  . Obesity   . CVA (cerebral vascular accident) 2008    at the time of his MI  . CHF (congestive heart failure)     past echo in 2010 showed an EF of 50 to 55% with mild AS and mild mitral insufficiency  . Heart murmur   . Mitral insufficiency   . Edema   . Long term current use of anticoagulant     previously on coumadin therapy.     Past Surgical History  Procedure Laterality Date  . Coronary angioplasty with stent placement  04/10/2007    with drug-eluting stent placement of the mid LAD  . Knee surgery      History  Smoking status  . Former Smoker  . Quit date: 03/30/1975  Smokeless tobacco  . Not on file    History  Alcohol Use No    History reviewed. No pertinent family  history.  Review of Systems: The review of systems is per the HPI.  All other systems were reviewed and are negative.  Physical Exam: BP 150/62  Pulse 60  Ht 5\' 5"  (1.651 m)  Wt 216 lb 9.6 oz (98.249 kg)  BMI 36.04 kg/m2 Patient is very pleasant and in no acute distress. He remains obese. Skin is warm and dry. Color is normal.  HEENT is unremarkable. Normocephalic/atraumatic. PERRL. Sclera are nonicteric. Neck is supple. No masses. No JVD. Lungs are clear. Cardiac exam shows a regular rate and rhythm. Normal S1-2. No gallop or murmur. Abdomen is soft. Extremities are without edema. Gait and ROM are intact. No gross neurologic deficits noted.  LABORATORY DATA: Done by his PCP.  Ecg: NSR with old septal infarct.    Assessment / Plan: 1. CAD - s/p remote anterior MI with DES of LAD 2008. Doing well clinically. No symptoms at present. Continue to work on CV risk factor modification. I have recommended a follow up nuclear stress test to compare with Nov. 2011.  2. HTN - BP well controlled.   3. Obesity  4. Diabetes mellitus type 2.   5. Hyperlipidemia. On high dose  statin.  I have requested his lab results from Dr. Sharl Ma.

## 2014-04-27 ENCOUNTER — Encounter: Payer: Self-pay | Admitting: Cardiology

## 2014-04-28 ENCOUNTER — Ambulatory Visit (HOSPITAL_COMMUNITY): Payer: Medicare Other | Attending: Cardiology | Admitting: Radiology

## 2014-04-28 VITALS — BP 145/63 | Ht 65.0 in | Wt 214.0 lb

## 2014-04-28 DIAGNOSIS — I10 Essential (primary) hypertension: Secondary | ICD-10-CM

## 2014-04-28 DIAGNOSIS — I2510 Atherosclerotic heart disease of native coronary artery without angina pectoris: Secondary | ICD-10-CM

## 2014-04-28 DIAGNOSIS — E1169 Type 2 diabetes mellitus with other specified complication: Secondary | ICD-10-CM

## 2014-04-28 DIAGNOSIS — R0609 Other forms of dyspnea: Secondary | ICD-10-CM | POA: Insufficient documentation

## 2014-04-28 DIAGNOSIS — E785 Hyperlipidemia, unspecified: Secondary | ICD-10-CM

## 2014-04-28 DIAGNOSIS — E669 Obesity, unspecified: Secondary | ICD-10-CM

## 2014-04-28 DIAGNOSIS — I252 Old myocardial infarction: Secondary | ICD-10-CM | POA: Insufficient documentation

## 2014-04-28 DIAGNOSIS — Z9861 Coronary angioplasty status: Secondary | ICD-10-CM | POA: Insufficient documentation

## 2014-04-28 DIAGNOSIS — R002 Palpitations: Secondary | ICD-10-CM | POA: Insufficient documentation

## 2014-04-28 DIAGNOSIS — R0989 Other specified symptoms and signs involving the circulatory and respiratory systems: Secondary | ICD-10-CM | POA: Insufficient documentation

## 2014-04-28 MED ORDER — TECHNETIUM TC 99M SESTAMIBI GENERIC - CARDIOLITE
10.0000 | Freq: Once | INTRAVENOUS | Status: AC | PRN
  Administered 2014-04-28: 10 via INTRAVENOUS

## 2014-04-28 MED ORDER — TECHNETIUM TC 99M SESTAMIBI GENERIC - CARDIOLITE
30.0000 | Freq: Once | INTRAVENOUS | Status: AC | PRN
  Administered 2014-04-28: 30 via INTRAVENOUS

## 2014-04-28 NOTE — Progress Notes (Signed)
[facility] SITE 3 NUCLEAR MED [address] [place], [name] [address] [phone]    Cardiology Nuclear Med Study  [name] is a 73 y.o. male     MRN : [id number]     DOB: 1941-09-18  Procedure Date: 04/28/2014  Nuclear Med Background Indication for Stress Test:  Evaluation for Ischemia History:  CAD-MI-CATH-STENT-LAD '10 ECHO: EF: 50-55% mild AS  '11 MPI: EF: 46% Anteroseptal apical Cardiac Risk Factors: Family History - CAD, History of Smoking, Hypertension, Lipids and NIDDM  Symptoms:  DOE and Palpitations   Nuclear Pre-Procedure Caffeine/Decaff Intake:  None NPO After: 8:00pm   Lungs:  clear O2 Sat: 98% on room air. IV 0.9% NS with Angio Cath:  22g  IV Site: R Hand  IV Started by:  Bonnita Levan, RN  Chest Size (in):  48 Cup Size: n/a  Height: 5\' 5"  (1.651 m)  Weight:  214 lb (97.07 kg)  BMI:  Body mass index is 35.61 kg/(m^2). Tech Comments:  Last dose of Coreg yesterday pm    Nuclear Med Study 1 or 2 day study: 1 day  Stress Test Type:  Stress  Reading MD: N/A  Order Authorizing Provider:  Peter Swaziland, MD  Resting Radionuclide: Technetium 41m Sestamibi  Resting Radionuclide Dose: 11.0 mCi   Stress Radionuclide:  Technetium 47m Sestamibi  Stress Radionuclide Dose: 33.0 mCi           Stress Protocol Rest HR: 56 Stress HR: 155  Rest BP: 145/63 Stress BP: 204/81  Exercise Time (min): 4:00 METS: 5.80   Predicted Max HR: 149 bpm % Max HR: 104.03 bpm Rate Pressure Product: 32951   Dose of Adenosine (mg):  n/a Dose of Lexiscan: n/a mg  Dose of Atropine (mg): n/a Dose of Dobutamine: n/a mcg/kg/min (at max HR)  Stress Test Technologist: Milana Na, EMT-P  Nuclear Technologist:  Domenic Polite, CNMT     Rest Procedure:  Myocardial perfusion imaging was performed at rest 45 minutes following the intravenous administration of Technetium 35m Sestamibi. Rest ECG:  Normal sinus rhythm. Decreased anterior R wave progression.  Stress Procedure:  The  patient exercised on the treadmill utilizing the Bruce Protocol for 4:00 minutes. The patient stopped due to fatigue, sob, and denied any chest pain.  Technetium 74m Sestamibi was injected at peak exercise and myocardial perfusion imaging was performed after a brief delay. Stress ECG: No significant change from baseline ECG  QPS Raw Data Images:  Normal; no motion artifact; normal heart/lung ratio. Stress Images:  Large area of severe decreased uptake affecting the mid anterior segment, mid anteroseptal segment, and the entire apex. There is slight reversibility. Rest Images:  Large area of moderately severe decreased uptake affecting the mid anteroseptal segment, apical, apical inferior segment, apical septal segment, and apical anterior segment. Subtraction (SDS):  There is mild reversibility at the apical lateral segment, apical inferior segment, and mid anterior segment. Transient Ischemic Dilatation (Normal <1.22):  0.95 Lung/Heart Ratio (Normal <0.45):  0.31  Quantitative Gated Spect Images QGS EDV:  151 ml QGS ESV:  68 ml  Impression Exercise Capacity:  Fair exercise capacity. BP Response:  Normal blood pressure response. Clinical Symptoms:  Shortness of breath ECG Impression:  No significant ST segment change suggestive of ischemia. Comparison with Prior Nuclear Study: No images to compare  Overall Impression:  There is a large area of significant scar affecting the apical cap, apical inferior segment, apical septal segment, mid anteroseptal segment, apical anterior segment. There is  some peri-infarct ischemia in the apical lateral segment and mid anterior segment. This appears to be significant scar with some peri-infarct ischemia. It is surprising that wall motion analysis reveals only mild apical hypokinesis. This is a moderate risk scan.  LV Ejection Fraction: 55%.  LV Wall Motion:  Wall motion is surprisingly good when compared to the tomographic images. There is mild apical  hypokinesis. The ejection fraction appears to be maintained.  Guy RoughJeffrey Westen Dinino, MD

## 2014-05-06 ENCOUNTER — Telehealth: Payer: Self-pay | Admitting: Cardiology

## 2014-05-06 NOTE — Telephone Encounter (Signed)
Pt returning your call

## 2014-05-06 NOTE — Telephone Encounter (Signed)
Returned call to patient myoview results given. 

## 2014-09-09 ENCOUNTER — Other Ambulatory Visit: Payer: Self-pay | Admitting: Cardiology

## 2015-04-04 ENCOUNTER — Ambulatory Visit (INDEPENDENT_AMBULATORY_CARE_PROVIDER_SITE_OTHER): Payer: Medicare Other | Admitting: Cardiology

## 2015-04-04 ENCOUNTER — Other Ambulatory Visit: Payer: Self-pay | Admitting: Cardiology

## 2015-04-04 ENCOUNTER — Encounter: Payer: Self-pay | Admitting: Cardiology

## 2015-04-04 VITALS — BP 134/80 | HR 82 | Ht 65.0 in | Wt 223.2 lb

## 2015-04-04 DIAGNOSIS — I10 Essential (primary) hypertension: Secondary | ICD-10-CM

## 2015-04-04 DIAGNOSIS — E785 Hyperlipidemia, unspecified: Secondary | ICD-10-CM | POA: Diagnosis not present

## 2015-04-04 DIAGNOSIS — E119 Type 2 diabetes mellitus without complications: Secondary | ICD-10-CM | POA: Diagnosis not present

## 2015-04-04 DIAGNOSIS — E1169 Type 2 diabetes mellitus with other specified complication: Secondary | ICD-10-CM

## 2015-04-04 DIAGNOSIS — I2510 Atherosclerotic heart disease of native coronary artery without angina pectoris: Secondary | ICD-10-CM

## 2015-04-04 DIAGNOSIS — E669 Obesity, unspecified: Secondary | ICD-10-CM

## 2015-04-04 NOTE — Patient Instructions (Signed)
You need to increase your aerobic activity  I will get a copy of your lab work from Dr. Sharl Ma  I will see you in one year.

## 2015-04-04 NOTE — Progress Notes (Signed)
[name] Date of Birth: 11/14/1940 Medical Record #[id number]  History of Present Illness: [name] is seen for follow up of CAD. He has known CAD with prior MI in 2008 (presented with syncope), DES to the LAD, stroke, HTN and DM. Last stress test was in July 2015 with EF 55% and with an anteroseptal apical infarct. Minimal peri-infarct ischemia. EF recovered nicely per follow up echo in 2010.   On follow up today he reports he is doing well. Reports no change in medical status this past year. No chest pain or SOB. Occasional mild edema. Notes he is very sedentary and can tell when he walks that he is out of shape.     Medication List       This list is accurate as of: 04/04/15  8:10 AM.  Always use your most recent med list.               aspirin 81 MG tablet  Take 81 mg by mouth daily.     atorvastatin 80 MG tablet  Commonly known as:  LIPITOR  Take 1 tablet (80 mg total) by mouth daily.     carvedilol 12.5 MG tablet  Commonly known as:  COREG  Take 1 tablet (12.5 mg total) by mouth 2 (two) times daily.     CENTRUM SILVER PO  Take by mouth daily.     clopidogrel 75 MG tablet  Commonly known as:  PLAVIX  Take 1 tablet (75 mg total) by mouth daily.     metFORMIN 500 MG tablet  Commonly known as:  GLUCOPHAGE  Take 500 mg by mouth 2 (two) times daily with a meal.     nitroGLYCERIN 0.4 MG SL tablet  Commonly known as:  NITROSTAT  Place 1 tablet (0.4 mg total) under the tongue every 5 (five) minutes as needed.     pioglitazone 45 MG tablet  Commonly known as:  ACTOS  Take 45 mg by mouth daily.     ramipril 2.5 MG capsule  Commonly known as:  ALTACE  TAKE 1 CAPSULE DAILY.         No Known Allergies  Past Medical History  Diagnosis Date  . Coronary artery disease 2008    Prior anterior MI (with syncope as the initial presentation) with single vessel LAD occlusive artherosclerotic CAD -- post stent of the mid LAD  . Diabetes mellitus   . Hypertension     . Hyperlipidemia   . Myocardial infarction, old 2008  . Obesity   . CVA (cerebral vascular accident) 2008    at the time of his MI  . CHF (congestive heart failure)     past echo in 2010 showed an EF of 50 to 55% with mild AS and mild mitral insufficiency  . Heart murmur   . Mitral insufficiency   . Edema   . Long term current use of anticoagulant     previously on coumadin therapy.     Past Surgical History  Procedure Laterality Date  . Coronary angioplasty with stent placement  04/10/2007    with drug-eluting stent placement of the mid LAD  . Knee surgery      History  Smoking status  . Former Smoker  . Quit date: 03/30/1975  Smokeless tobacco  . Not on file    History  Alcohol Use No    History reviewed. No pertinent family history.  Review of Systems: The review of systems is per the HPI.  All  other systems were reviewed and are negative.  Physical Exam: BP 134/80 mmHg  Pulse 82  Ht  (1.651 m)  Wt 101.243 kg (223 lb 3.2 oz)  BMI 37.14 kg/m2 Patient is very pleasant and in no acute distress. He remains obese. Skin is warm and dry. Color is normal.  HEENT is unremarkable. Normocephalic/atraumatic. PERRL. Sclera are nonicteric. Neck is supple. No masses. No JVD. Lungs are clear. Cardiac exam shows a regular rate and rhythm. Normal S1-2. No gallop or murmur. Abdomen is soft. Extremities have 1+ edema. Gait and ROM are intact. No gross neurologic deficits noted.  LABORATORY DATA: Done by his PCP.  Ecg: NSR with first degree AV block. Old anteroseptal infarct. I have personally reviewed and interpreted this study.    Assessment / Plan: 1. CAD - s/p remote anterior MI with DES of LAD 2008. Doing well clinically. No symptoms at present. Myoview in July 2015 with old anteroapical scar. No significant ischemia. Since he has a Taxus stent in the LAD I would favor long term DAPT. Follow up in one year.   2. HTN - BP well controlled.   3. Obesity  4. Diabetes  mellitus type 2.   5. Hyperlipidemia. On high dose statin.  I have requested his lab results from Dr. Sharl Ma. Encourage increased aerobic activity.

## 2015-04-11 ENCOUNTER — Encounter: Payer: Self-pay | Admitting: Cardiology

## 2015-06-12 ENCOUNTER — Other Ambulatory Visit: Payer: Self-pay | Admitting: Cardiology

## 2015-08-17 ENCOUNTER — Encounter: Payer: Self-pay | Admitting: Cardiology

## 2016-01-03 DIAGNOSIS — Z79899 Other long term (current) drug therapy: Secondary | ICD-10-CM | POA: Diagnosis not present

## 2016-01-03 DIAGNOSIS — E1142 Type 2 diabetes mellitus with diabetic polyneuropathy: Secondary | ICD-10-CM | POA: Diagnosis not present

## 2016-01-03 DIAGNOSIS — I2510 Atherosclerotic heart disease of native coronary artery without angina pectoris: Secondary | ICD-10-CM | POA: Diagnosis not present

## 2016-01-03 DIAGNOSIS — Z5181 Encounter for therapeutic drug level monitoring: Secondary | ICD-10-CM | POA: Diagnosis not present

## 2016-01-03 DIAGNOSIS — E785 Hyperlipidemia, unspecified: Secondary | ICD-10-CM | POA: Diagnosis not present

## 2016-01-03 DIAGNOSIS — Z7984 Long term (current) use of oral hypoglycemic drugs: Secondary | ICD-10-CM | POA: Diagnosis not present

## 2016-01-12 DIAGNOSIS — E119 Type 2 diabetes mellitus without complications: Secondary | ICD-10-CM | POA: Diagnosis not present

## 2016-01-12 DIAGNOSIS — H353131 Nonexudative age-related macular degeneration, bilateral, early dry stage: Secondary | ICD-10-CM | POA: Diagnosis not present

## 2016-04-09 ENCOUNTER — Other Ambulatory Visit: Payer: Self-pay | Admitting: Cardiology

## 2016-04-09 NOTE — Telephone Encounter (Signed)
Rx request sent to pharmacy.  

## 2016-05-02 ENCOUNTER — Ambulatory Visit (INDEPENDENT_AMBULATORY_CARE_PROVIDER_SITE_OTHER): Payer: Medicare Other | Admitting: Cardiology

## 2016-05-02 ENCOUNTER — Encounter: Payer: Self-pay | Admitting: Cardiology

## 2016-05-02 VITALS — BP 128/78 | HR 65 | Ht 65.0 in | Wt 224.8 lb

## 2016-05-02 DIAGNOSIS — E119 Type 2 diabetes mellitus without complications: Secondary | ICD-10-CM

## 2016-05-02 DIAGNOSIS — E669 Obesity, unspecified: Secondary | ICD-10-CM

## 2016-05-02 DIAGNOSIS — E1169 Type 2 diabetes mellitus with other specified complication: Secondary | ICD-10-CM

## 2016-05-02 DIAGNOSIS — E785 Hyperlipidemia, unspecified: Secondary | ICD-10-CM

## 2016-05-02 DIAGNOSIS — Z9861 Coronary angioplasty status: Secondary | ICD-10-CM

## 2016-05-02 DIAGNOSIS — I2510 Atherosclerotic heart disease of native coronary artery without angina pectoris: Secondary | ICD-10-CM | POA: Diagnosis not present

## 2016-05-02 DIAGNOSIS — I10 Essential (primary) hypertension: Secondary | ICD-10-CM

## 2016-05-02 MED ORDER — RAMIPRIL 2.5 MG PO CAPS: 2.5000 mg | ORAL_CAPSULE | Freq: Every day | ORAL | Status: DC

## 2016-05-02 MED ORDER — CARVEDILOL 12.5 MG PO TABS: 12.5000 mg | ORAL_TABLET | Freq: Two times a day (BID) | ORAL | Status: DC

## 2016-05-02 MED ORDER — ATORVASTATIN CALCIUM 80 MG PO TABS: 80.0000 mg | ORAL_TABLET | Freq: Every day | ORAL | Status: DC

## 2016-05-02 MED ORDER — CLOPIDOGREL BISULFATE 75 MG PO TABS: 75.0000 mg | ORAL_TABLET | Freq: Every day | ORAL | Status: DC

## 2016-05-02 NOTE — Patient Instructions (Signed)
Your physician wants you to follow-up in: 1 Year Dr [place]. You will receive a reminder letter in the mail two months in advance. If you don't receive a letter, please call our office to schedule the follow-up appointment.

## 2016-05-02 NOTE — Assessment & Plan Note (Signed)
BMI 37  
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
## 2016-05-02 NOTE — Assessment & Plan Note (Signed)
Controlled.  

## 2016-05-02 NOTE — Assessment & Plan Note (Signed)
Followed by PCP- will request labs from March 2017

## 2016-05-02 NOTE — Assessment & Plan Note (Signed)
On oral agents, followed by PCP 

## 2016-05-02 NOTE — Assessment & Plan Note (Signed)
MI-LAD PCI 2008. Myoview July 2015 showed scar with moderate peri infarct ischemia but pt is asymptomatic.

## 2016-07-05 DIAGNOSIS — Z5181 Encounter for therapeutic drug level monitoring: Secondary | ICD-10-CM | POA: Diagnosis not present

## 2016-07-05 DIAGNOSIS — E785 Hyperlipidemia, unspecified: Secondary | ICD-10-CM | POA: Diagnosis not present

## 2016-07-05 DIAGNOSIS — D649 Anemia, unspecified: Secondary | ICD-10-CM | POA: Diagnosis not present

## 2016-07-05 DIAGNOSIS — Z7984 Long term (current) use of oral hypoglycemic drugs: Secondary | ICD-10-CM | POA: Diagnosis not present

## 2016-07-05 DIAGNOSIS — Z79899 Other long term (current) drug therapy: Secondary | ICD-10-CM | POA: Diagnosis not present

## 2016-07-05 DIAGNOSIS — D539 Nutritional anemia, unspecified: Secondary | ICD-10-CM | POA: Diagnosis not present

## 2016-07-05 DIAGNOSIS — I2510 Atherosclerotic heart disease of native coronary artery without angina pectoris: Secondary | ICD-10-CM | POA: Diagnosis not present

## 2016-07-05 DIAGNOSIS — E1142 Type 2 diabetes mellitus with diabetic polyneuropathy: Secondary | ICD-10-CM | POA: Diagnosis not present

## 2016-07-05 DIAGNOSIS — Z23 Encounter for immunization: Secondary | ICD-10-CM | POA: Diagnosis not present

## 2016-07-12 DIAGNOSIS — H40053 Ocular hypertension, bilateral: Secondary | ICD-10-CM | POA: Diagnosis not present

## 2016-10-15 DIAGNOSIS — T82855A Stenosis of coronary artery stent, initial encounter: Secondary | ICD-10-CM | POA: Diagnosis not present

## 2016-10-15 DIAGNOSIS — R0600 Dyspnea, unspecified: Secondary | ICD-10-CM | POA: Diagnosis not present

## 2016-10-15 DIAGNOSIS — K625 Hemorrhage of anus and rectum: Secondary | ICD-10-CM | POA: Diagnosis not present

## 2016-10-15 DIAGNOSIS — I10 Essential (primary) hypertension: Secondary | ICD-10-CM | POA: Diagnosis not present

## 2016-10-15 DIAGNOSIS — I340 Nonrheumatic mitral (valve) insufficiency: Secondary | ICD-10-CM | POA: Diagnosis not present

## 2016-10-15 DIAGNOSIS — I5021 Acute systolic (congestive) heart failure: Secondary | ICD-10-CM | POA: Diagnosis not present

## 2016-10-15 DIAGNOSIS — I2510 Atherosclerotic heart disease of native coronary artery without angina pectoris: Secondary | ICD-10-CM | POA: Diagnosis not present

## 2016-10-15 DIAGNOSIS — E785 Hyperlipidemia, unspecified: Secondary | ICD-10-CM | POA: Diagnosis not present

## 2016-10-15 DIAGNOSIS — R0689 Other abnormalities of breathing: Secondary | ICD-10-CM | POA: Diagnosis not present

## 2016-10-15 DIAGNOSIS — D126 Benign neoplasm of colon, unspecified: Secondary | ICD-10-CM | POA: Diagnosis not present

## 2016-10-15 DIAGNOSIS — I110 Hypertensive heart disease with heart failure: Secondary | ICD-10-CM | POA: Diagnosis not present

## 2016-10-15 DIAGNOSIS — J9601 Acute respiratory failure with hypoxia: Secondary | ICD-10-CM | POA: Diagnosis not present

## 2016-10-15 DIAGNOSIS — I509 Heart failure, unspecified: Secondary | ICD-10-CM | POA: Diagnosis not present

## 2016-10-15 DIAGNOSIS — D125 Benign neoplasm of sigmoid colon: Secondary | ICD-10-CM | POA: Diagnosis not present

## 2016-10-15 DIAGNOSIS — R0602 Shortness of breath: Secondary | ICD-10-CM | POA: Diagnosis not present

## 2016-10-15 DIAGNOSIS — I371 Nonrheumatic pulmonary valve insufficiency: Secondary | ICD-10-CM | POA: Diagnosis not present

## 2016-10-15 DIAGNOSIS — R9431 Abnormal electrocardiogram [ECG] [EKG]: Secondary | ICD-10-CM | POA: Diagnosis not present

## 2016-10-15 DIAGNOSIS — N179 Acute kidney failure, unspecified: Secondary | ICD-10-CM | POA: Diagnosis not present

## 2016-10-15 DIAGNOSIS — R710 Precipitous drop in hematocrit: Secondary | ICD-10-CM | POA: Diagnosis not present

## 2016-10-15 DIAGNOSIS — I2729 Other secondary pulmonary hypertension: Secondary | ICD-10-CM | POA: Diagnosis not present

## 2016-10-15 DIAGNOSIS — D509 Iron deficiency anemia, unspecified: Secondary | ICD-10-CM | POA: Diagnosis not present

## 2016-10-15 DIAGNOSIS — I772 Rupture of artery: Secondary | ICD-10-CM | POA: Diagnosis not present

## 2016-10-15 DIAGNOSIS — I272 Pulmonary hypertension, unspecified: Secondary | ICD-10-CM | POA: Diagnosis not present

## 2016-10-15 DIAGNOSIS — D62 Acute posthemorrhagic anemia: Secondary | ICD-10-CM | POA: Diagnosis not present

## 2016-10-15 DIAGNOSIS — E119 Type 2 diabetes mellitus without complications: Secondary | ICD-10-CM | POA: Diagnosis not present

## 2016-10-15 DIAGNOSIS — D649 Anemia, unspecified: Secondary | ICD-10-CM | POA: Diagnosis not present

## 2016-10-15 DIAGNOSIS — I071 Rheumatic tricuspid insufficiency: Secondary | ICD-10-CM | POA: Diagnosis not present

## 2016-10-16 DIAGNOSIS — R0602 Shortness of breath: Secondary | ICD-10-CM | POA: Diagnosis not present

## 2016-10-16 DIAGNOSIS — I071 Rheumatic tricuspid insufficiency: Secondary | ICD-10-CM | POA: Diagnosis not present

## 2016-10-16 DIAGNOSIS — R0600 Dyspnea, unspecified: Secondary | ICD-10-CM | POA: Diagnosis not present

## 2016-10-16 DIAGNOSIS — I340 Nonrheumatic mitral (valve) insufficiency: Secondary | ICD-10-CM | POA: Diagnosis not present

## 2016-10-16 DIAGNOSIS — I371 Nonrheumatic pulmonary valve insufficiency: Secondary | ICD-10-CM | POA: Diagnosis not present

## 2016-10-16 DIAGNOSIS — I2510 Atherosclerotic heart disease of native coronary artery without angina pectoris: Secondary | ICD-10-CM | POA: Diagnosis not present

## 2016-10-17 DIAGNOSIS — R9431 Abnormal electrocardiogram [ECG] [EKG]: Secondary | ICD-10-CM | POA: Diagnosis not present

## 2016-10-17 DIAGNOSIS — D125 Benign neoplasm of sigmoid colon: Secondary | ICD-10-CM | POA: Diagnosis not present

## 2016-10-17 DIAGNOSIS — I509 Heart failure, unspecified: Secondary | ICD-10-CM | POA: Diagnosis not present

## 2016-10-17 DIAGNOSIS — E119 Type 2 diabetes mellitus without complications: Secondary | ICD-10-CM | POA: Diagnosis not present

## 2016-10-17 DIAGNOSIS — E785 Hyperlipidemia, unspecified: Secondary | ICD-10-CM | POA: Diagnosis not present

## 2016-10-17 DIAGNOSIS — I10 Essential (primary) hypertension: Secondary | ICD-10-CM | POA: Diagnosis not present

## 2016-10-17 DIAGNOSIS — I2510 Atherosclerotic heart disease of native coronary artery without angina pectoris: Secondary | ICD-10-CM | POA: Diagnosis not present

## 2016-10-17 DIAGNOSIS — D509 Iron deficiency anemia, unspecified: Secondary | ICD-10-CM | POA: Diagnosis not present

## 2016-10-17 DIAGNOSIS — I2729 Other secondary pulmonary hypertension: Secondary | ICD-10-CM | POA: Diagnosis not present

## 2016-10-17 DIAGNOSIS — D649 Anemia, unspecified: Secondary | ICD-10-CM | POA: Diagnosis not present

## 2016-10-19 DIAGNOSIS — D126 Benign neoplasm of colon, unspecified: Secondary | ICD-10-CM | POA: Diagnosis not present

## 2016-10-19 DIAGNOSIS — I509 Heart failure, unspecified: Secondary | ICD-10-CM | POA: Diagnosis not present

## 2016-10-19 DIAGNOSIS — D649 Anemia, unspecified: Secondary | ICD-10-CM | POA: Diagnosis not present

## 2016-10-19 DIAGNOSIS — J9601 Acute respiratory failure with hypoxia: Secondary | ICD-10-CM | POA: Diagnosis not present
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
## 2016-10-26 ENCOUNTER — Encounter: Payer: Self-pay | Admitting: Cardiology

## 2016-10-26 ENCOUNTER — Ambulatory Visit (INDEPENDENT_AMBULATORY_CARE_PROVIDER_SITE_OTHER): Payer: Medicare Other | Admitting: Cardiology

## 2016-10-26 VITALS — BP 154/90 | HR 94 | Ht 65.0 in | Wt 210.0 lb

## 2016-10-26 DIAGNOSIS — I255 Ischemic cardiomyopathy: Secondary | ICD-10-CM | POA: Diagnosis not present

## 2016-10-26 DIAGNOSIS — I5042 Chronic combined systolic (congestive) and diastolic (congestive) heart failure: Secondary | ICD-10-CM | POA: Insufficient documentation

## 2016-10-26 DIAGNOSIS — Z9861 Coronary angioplasty status: Secondary | ICD-10-CM

## 2016-10-26 DIAGNOSIS — I5023 Acute on chronic systolic (congestive) heart failure: Secondary | ICD-10-CM | POA: Diagnosis not present

## 2016-10-26 DIAGNOSIS — I2510 Atherosclerotic heart disease of native coronary artery without angina pectoris: Secondary | ICD-10-CM

## 2016-10-26 DIAGNOSIS — I10 Essential (primary) hypertension: Secondary | ICD-10-CM

## 2016-10-26 DIAGNOSIS — I5043 Acute on chronic combined systolic (congestive) and diastolic (congestive) heart failure: Secondary | ICD-10-CM

## 2016-10-26 MED ORDER — SACUBITRIL-VALSARTAN 49-51 MG PO TABS: 1.0000 | ORAL_TABLET | Freq: Two times a day (BID) | ORAL | Status: DC

## 2016-10-26 MED ORDER — FUROSEMIDE 20 MG PO TABS: 20.0000 mg | ORAL_TABLET | Freq: Every day | ORAL | 3 refills | Status: DC

## 2016-10-26 NOTE — Patient Instructions (Addendum)
Stop taking Actos. You will need to follow up with Dr. Sharl Ma about your blood sugars.  We will check blood work  Stop Plavix  We will increase Entresto to 49/51 mg twice a day  Start lasix 20 mg daily  Continue your other therapy  We will arrange follow up in 2 months.

## 2016-10-30 ENCOUNTER — Telehealth: Payer: Self-pay | Admitting: Cardiology

## 2016-10-30 ENCOUNTER — Other Ambulatory Visit: Payer: Self-pay | Admitting: *Deleted

## 2016-10-30 DIAGNOSIS — I10 Essential (primary) hypertension: Secondary | ICD-10-CM | POA: Diagnosis not present

## 2016-10-30 DIAGNOSIS — I5023 Acute on chronic systolic (congestive) heart failure: Secondary | ICD-10-CM | POA: Diagnosis not present

## 2016-10-30 DIAGNOSIS — I255 Ischemic cardiomyopathy: Secondary | ICD-10-CM | POA: Diagnosis not present

## 2016-10-30 DIAGNOSIS — I2510 Atherosclerotic heart disease of native coronary artery without angina pectoris: Secondary | ICD-10-CM | POA: Diagnosis not present

## 2016-10-30 DIAGNOSIS — Z9861 Coronary angioplasty status: Secondary | ICD-10-CM | POA: Diagnosis not present

## 2016-10-30 LAB — CBC WITH DIFFERENTIAL/PLATELET
Basophils Absolute: 66 cells/uL (ref 0–200)
Basophils Relative: 1 %
EOS ABS: 198 {cells}/uL (ref 15–500)
Eosinophils Relative: 3 %
HEMATOCRIT: 35.7 % — AB (ref 38.5–50.0)
Hemoglobin: 11.4 g/dL — ABNORMAL LOW (ref 13.2–17.1)
LYMPHS PCT: 16 %
Lymphs Abs: 1056 cells/uL (ref 850–3900)
MCH: 30.3 pg (ref 27.0–33.0)
MCHC: 31.9 g/dL — ABNORMAL LOW (ref 32.0–36.0)
MCV: 94.9 fL (ref 80.0–100.0)
MONO ABS: 528 {cells}/uL (ref 200–950)
MPV: 9.6 fL (ref 7.5–12.5)
Monocytes Relative: 8 %
NEUTROS ABS: 4752 {cells}/uL (ref 1500–7800)
Neutrophils Relative %: 72 %
Platelets: 329 10*3/uL (ref 140–400)
RBC: 3.76 MIL/uL — ABNORMAL LOW (ref 4.20–5.80)
RDW: 14.8 % (ref 11.0–15.0)
WBC: 6.6 10*3/uL (ref 3.8–10.8)

## 2016-10-30 MED ORDER — SACUBITRIL-VALSARTAN 49-51 MG PO TABS: 1.0000 | ORAL_TABLET | Freq: Two times a day (BID) | ORAL | 3 refills | Status: DC

## 2016-10-30 NOTE — Telephone Encounter (Signed)
New Message   Patient needing authorization for medication  sacubitril-valsartan (ENTRESTO) 49-51 MG    Patient calling the office for samples of medication:   1.  What medication and dosage are you requesting samples for?  sacubitril-valsartan (ENTRESTO) 49-51 MG     2.  Are you currently out of this medication? No. Has enough for today.

## 2016-10-30 NOTE — Telephone Encounter (Signed)
Medication samples have been provided to the patient.  Drug name: entrestor 49/51mg   Qty: 1 box  LOT: [id number]  Exp.Date: 04/2018  Samples left at front desk for patient pick-up. Patient notified.  Patient states [employer] needs pre-authorization/prior auth for [name] 4:31 PM 10/30/2016

## 2016-10-31 LAB — COMPREHENSIVE METABOLIC PANEL
ALBUMIN: 3.5 g/dL — AB (ref 3.6–5.1)
ALK PHOS: 53 U/L (ref 40–115)
ALT: 16 U/L (ref 9–46)
AST: 22 U/L (ref 10–35)
BUN: 16 mg/dL (ref 7–25)
CALCIUM: 8.6 mg/dL (ref 8.6–10.3)
CO2: 29 mmol/L (ref 20–31)
Chloride: 104 mmol/L (ref 98–110)
Creat: 1.14 mg/dL (ref 0.70–1.18)
Glucose, Bld: 108 mg/dL — ABNORMAL HIGH (ref 65–99)
POTASSIUM: 3.5 mmol/L (ref 3.5–5.3)
Sodium: 145 mmol/L (ref 135–146)
TOTAL PROTEIN: 6 g/dL — AB (ref 6.1–8.1)
Total Bilirubin: 0.9 mg/dL (ref 0.2–1.2)

## 2016-10-31 LAB — BRAIN NATRIURETIC PEPTIDE: Brain Natriuretic Peptide: 654.9 pg/mL — ABNORMAL HIGH (ref <100)

## 2016-11-06 NOTE — Telephone Encounter (Signed)
[name] acknowledged, is working on this currently.

## 2016-11-06 NOTE — Telephone Encounter (Signed)
Patient called to follow up on status of prior auth for entresto.

## 2016-11-06 NOTE — Telephone Encounter (Signed)
°  New Prob   Requesting pre-authorization for sacubitril-valsartan (ENTRESTO) 49-51 MG. Pt received his samples but is concerned he may run out of his sample supply before receiving his medication from the pharmacy. States pharmacy has not received pre-authorization. Please call.

## 2016-11-06 NOTE — Telephone Encounter (Signed)
Prior authorization done for Sherryll Burger Form faxed to fax # [phone].Entresto 49/51 mg samples left at [employer] office front desk.

## 2016-11-29 ENCOUNTER — Telehealth: Payer: Self-pay | Admitting: Cardiology

## 2016-11-29 NOTE — Telephone Encounter (Signed)
Pt calling with some medical questions he wants to ask a nurse-pls call

## 2016-11-29 NOTE — Telephone Encounter (Signed)
Returned call to patient.He stated insurance denied payment for [employer].Advised I will resend prior authorization to [facility]. Echo in [facility] 12/17 revealed EF to be 45 to 50 %.Form refaxed to [facility] at fax # [phone].
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
## 2016-12-05 ENCOUNTER — Ambulatory Visit (INDEPENDENT_AMBULATORY_CARE_PROVIDER_SITE_OTHER): Payer: Medicare Other | Admitting: Cardiology

## 2016-12-05 ENCOUNTER — Encounter: Payer: Self-pay | Admitting: Cardiology

## 2016-12-05 VITALS — BP 155/78 | HR 75 | Ht 65.0 in | Wt 197.6 lb

## 2016-12-05 DIAGNOSIS — I2510 Atherosclerotic heart disease of native coronary artery without angina pectoris: Secondary | ICD-10-CM

## 2016-12-05 DIAGNOSIS — I255 Ischemic cardiomyopathy: Secondary | ICD-10-CM | POA: Diagnosis not present

## 2016-12-05 DIAGNOSIS — I5042 Chronic combined systolic (congestive) and diastolic (congestive) heart failure: Secondary | ICD-10-CM | POA: Diagnosis not present

## 2016-12-05 DIAGNOSIS — Z9861 Coronary angioplasty status: Secondary | ICD-10-CM

## 2016-12-05 DIAGNOSIS — I10 Essential (primary) hypertension: Secondary | ICD-10-CM

## 2016-12-05 MED ORDER — CARVEDILOL 25 MG PO TABS: 25.0000 mg | ORAL_TABLET | Freq: Two times a day (BID) | ORAL | 3 refills | Status: DC

## 2016-12-05 NOTE — Patient Instructions (Signed)
Increase Coreg to 25 mg twice a day  Continue your other therapy  I will see you in 3 months with lab work

## 2016-12-14 ENCOUNTER — Telehealth: Payer: Self-pay | Admitting: Cardiology

## 2016-12-14 NOTE — Telephone Encounter (Signed)
Pt returned Cheryl's call please give him a call back. °

## 2016-12-14 NOTE — Telephone Encounter (Signed)
See previous 12/14/16 note.

## 2016-12-14 NOTE — Telephone Encounter (Signed)
Received call from patient.He stated insurance has not approved Entresto.Advised I will fax a appeal letter to insurance.Advised to call me back if he needs more samples.

## 2016-12-14 NOTE — Telephone Encounter (Signed)
Left message on patient's personal voice mail to call and let me know if insurance approved entresto.

## 2016-12-19 ENCOUNTER — Telehealth: Payer: Self-pay | Admitting: Cardiology

## 2016-12-19 NOTE — Telephone Encounter (Signed)
New message     [name] from [facility] is calling in regards to appeal that was received by Dr. [place]and. Standard appeal was received, 7 day turn around time. Any additional information that Dr. Sw[place]and would like to add can be faxed to [phone].    Asked for call back number, she stated she did not need a call back number she was going to leave a detailed message.

## 2016-12-21 NOTE — Telephone Encounter (Signed)
Please call,needs more information for the pt's appeal for Entresto.Need to know if the pt have had a baseline Ejecton Fraction of less than or equal to 40%. If the member is approved will they continue the Ace Inhibitor or ARB be discontinued?

## 2016-12-21 NOTE — Telephone Encounter (Signed)
Returned call to [employer] with [employer].Left message on her personal voice mail patient's last echo in [facility] 09/2016 EF 45 to 50 %.Patient is not taking any ace or arbs.Takes Coreg 25 mg twice a day.All medications listed on last office note that was faxed with appeal letter.

## 2016-12-24 ENCOUNTER — Telehealth: Payer: Self-pay | Admitting: Cardiology

## 2016-12-24 NOTE — Telephone Encounter (Signed)
Entresto denied by insurance. I guess we will have to switch back to an ARB- take losartan 100 mg daily.  Lindberg Zenon [place] MD, [facility]

## 2016-12-24 NOTE — Telephone Encounter (Signed)
New message    [name] is calling to inform Dr. Martinique about the status of appeal on the medication Entresto. She states the appeal was upheld due to the left ventricular ejection fraction is greater than 40%. criteria has not been met. She states a denial letter has been faxed over.

## 2016-12-25 NOTE — Telephone Encounter (Signed)
Called patient no answer.LMTC. 

## 2016-12-26 MED ORDER — LOSARTAN POTASSIUM 100 MG PO TABS: 100.0000 mg | ORAL_TABLET | Freq: Every day | ORAL | 6 refills | Status: DC

## 2016-12-26 NOTE — Telephone Encounter (Signed)
Spoke to patient Dr.Jordan's recommendations given.Advised when he finishes entresto samples to stop and start losartan 100 mg daily.Advised to keep appointment as planned with Dr.Jordan call sooner if needed.

## 2017-01-03 DIAGNOSIS — E1142 Type 2 diabetes mellitus with diabetic polyneuropathy: Secondary | ICD-10-CM | POA: Diagnosis not present

## 2017-01-03 DIAGNOSIS — I2510 Atherosclerotic heart disease of native coronary artery without angina pectoris: Secondary | ICD-10-CM | POA: Diagnosis not present

## 2017-01-03 DIAGNOSIS — Z5181 Encounter for therapeutic drug level monitoring: Secondary | ICD-10-CM | POA: Diagnosis not present

## 2017-01-03 DIAGNOSIS — E785 Hyperlipidemia, unspecified: Secondary | ICD-10-CM | POA: Diagnosis not present

## 2017-01-09 DIAGNOSIS — H353131 Nonexudative age-related macular degeneration, bilateral, early dry stage: Secondary | ICD-10-CM | POA: Diagnosis not present

## 2017-01-09 DIAGNOSIS — H2513 Age-related nuclear cataract, bilateral: Secondary | ICD-10-CM | POA: Diagnosis not present

## 2017-01-09 DIAGNOSIS — E119 Type 2 diabetes mellitus without complications: Secondary | ICD-10-CM | POA: Diagnosis not present

## 2017-02-20 ENCOUNTER — Encounter: Payer: Self-pay | Admitting: *Deleted

## 2017-03-07 DIAGNOSIS — I255 Ischemic cardiomyopathy: Secondary | ICD-10-CM | POA: Diagnosis not present

## 2017-03-07 DIAGNOSIS — I2510 Atherosclerotic heart disease of native coronary artery without angina pectoris: Secondary | ICD-10-CM | POA: Diagnosis not present

## 2017-03-07 DIAGNOSIS — I10 Essential (primary) hypertension: Secondary | ICD-10-CM | POA: Diagnosis not present

## 2017-03-07 DIAGNOSIS — I5042 Chronic combined systolic (congestive) and diastolic (congestive) heart failure: Secondary | ICD-10-CM | POA: Diagnosis not present

## 2017-03-07 LAB — CBC WITH DIFFERENTIAL/PLATELET
BASOS PCT: 1 %
Basophils Absolute: 57 cells/uL (ref 0–200)
Eosinophils Absolute: 798 cells/uL — ABNORMAL HIGH (ref 15–500)
Eosinophils Relative: 14 %
HEMATOCRIT: 38.1 % — AB (ref 38.5–50.0)
Hemoglobin: 12.6 g/dL — ABNORMAL LOW (ref 13.2–17.1)
LYMPHS PCT: 18 %
Lymphs Abs: 1026 cells/uL (ref 850–3900)
MCH: 31.5 pg (ref 27.0–33.0)
MCHC: 33.1 g/dL (ref 32.0–36.0)
MCV: 95.3 fL (ref 80.0–100.0)
MONO ABS: 513 {cells}/uL (ref 200–950)
MONOS PCT: 9 %
MPV: 9 fL (ref 7.5–12.5)
NEUTROS ABS: 3306 {cells}/uL (ref 1500–7800)
Neutrophils Relative %: 58 %
PLATELETS: 224 10*3/uL (ref 140–400)
RBC: 4 MIL/uL — AB (ref 4.20–5.80)
RDW: 13.9 % (ref 11.0–15.0)
WBC: 5.7 10*3/uL (ref 3.8–10.8)

## 2017-03-08 LAB — LIPID PANEL
Cholesterol: 101 mg/dL (ref <200)
HDL: 46 mg/dL (ref >40)
LDL CALC: 43 mg/dL (ref <100)
Total CHOL/HDL Ratio: 2.2 Ratio (ref <5.0)
Triglycerides: 59 mg/dL (ref <150)
VLDL: 12 mg/dL (ref <30)

## 2017-03-08 LAB — BASIC METABOLIC PANEL
BUN: 18 mg/dL (ref 7–25)
CHLORIDE: 107 mmol/L (ref 98–110)
CO2: 27 mmol/L (ref 20–31)
Calcium: 8.6 mg/dL (ref 8.6–10.3)
Creat: 1.17 mg/dL (ref 0.70–1.18)
Glucose, Bld: 103 mg/dL — ABNORMAL HIGH (ref 65–99)
Potassium: 4.7 mmol/L (ref 3.5–5.3)
Sodium: 144 mmol/L (ref 135–146)

## 2017-03-08 LAB — HEPATIC FUNCTION PANEL
ALBUMIN: 3.5 g/dL — AB (ref 3.6–5.1)
ALT: 14 U/L (ref 9–46)
AST: 19 U/L (ref 10–35)
Alkaline Phosphatase: 55 U/L (ref 40–115)
BILIRUBIN TOTAL: 0.6 mg/dL (ref 0.2–1.2)
Bilirubin, Direct: 0.2 mg/dL (ref <=0.2)
Indirect Bilirubin: 0.4 mg/dL (ref 0.2–1.2)
TOTAL PROTEIN: 5.8 g/dL — AB (ref 6.1–8.1)

## 2017-03-08 LAB — BRAIN NATRIURETIC PEPTIDE: BRAIN NATRIURETIC PEPTIDE: 171.4 pg/mL — AB (ref <100)
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
## 2017-03-12 ENCOUNTER — Ambulatory Visit (INDEPENDENT_AMBULATORY_CARE_PROVIDER_SITE_OTHER): Payer: Medicare Other | Admitting: Cardiology

## 2017-03-12 ENCOUNTER — Encounter: Payer: Self-pay | Admitting: Cardiology

## 2017-03-12 VITALS — BP 140/90 | HR 76 | Ht 65.0 in | Wt 202.6 lb

## 2017-03-12 DIAGNOSIS — Z9861 Coronary angioplasty status: Secondary | ICD-10-CM

## 2017-03-12 DIAGNOSIS — E669 Obesity, unspecified: Secondary | ICD-10-CM | POA: Diagnosis not present

## 2017-03-12 DIAGNOSIS — E1169 Type 2 diabetes mellitus with other specified complication: Secondary | ICD-10-CM

## 2017-03-12 DIAGNOSIS — I5042 Chronic combined systolic (congestive) and diastolic (congestive) heart failure: Secondary | ICD-10-CM

## 2017-03-12 DIAGNOSIS — I2510 Atherosclerotic heart disease of native coronary artery without angina pectoris: Secondary | ICD-10-CM

## 2017-03-12 DIAGNOSIS — I10 Essential (primary) hypertension: Secondary | ICD-10-CM

## 2017-03-12 MED ORDER — SPIRONOLACTONE 25 MG PO TABS: 25.0000 mg | ORAL_TABLET | Freq: Every day | ORAL | 3 refills | Status: DC

## 2017-03-12 NOTE — Patient Instructions (Addendum)
We will add aldactone 25 mg daily for BP and heart failure  Continue your other therapy  We will check a blood test early next week  I will see you in 4 months

## 2017-03-18 DIAGNOSIS — E1169 Type 2 diabetes mellitus with other specified complication: Secondary | ICD-10-CM | POA: Diagnosis not present

## 2017-03-18 DIAGNOSIS — I10 Essential (primary) hypertension: Secondary | ICD-10-CM | POA: Diagnosis not present

## 2017-03-18 DIAGNOSIS — I2510 Atherosclerotic heart disease of native coronary artery without angina pectoris: Secondary | ICD-10-CM | POA: Diagnosis not present

## 2017-03-18 DIAGNOSIS — E669 Obesity, unspecified: Secondary | ICD-10-CM | POA: Diagnosis not present

## 2017-03-19 ENCOUNTER — Other Ambulatory Visit: Payer: Self-pay

## 2017-03-19 DIAGNOSIS — I10 Essential (primary) hypertension: Secondary | ICD-10-CM

## 2017-03-19 LAB — BASIC METABOLIC PANEL
BUN: 24 mg/dL (ref 7–25)
CALCIUM: 8.6 mg/dL (ref 8.6–10.3)
CO2: 27 mmol/L (ref 20–31)
Chloride: 103 mmol/L (ref 98–110)
Creat: 1.21 mg/dL — ABNORMAL HIGH (ref 0.70–1.18)
GLUCOSE: 91 mg/dL (ref 65–99)
Potassium: 4.8 mmol/L (ref 3.5–5.3)
Sodium: 140 mmol/L (ref 135–146)

## 2017-04-02 ENCOUNTER — Other Ambulatory Visit: Payer: Self-pay

## 2017-04-02 DIAGNOSIS — E875 Hyperkalemia: Secondary | ICD-10-CM

## 2017-04-02 DIAGNOSIS — I10 Essential (primary) hypertension: Secondary | ICD-10-CM

## 2017-04-02 LAB — BASIC METABOLIC PANEL
BUN/Creatinine Ratio: 23 (ref 10–24)
BUN: 24 mg/dL (ref 8–27)
CALCIUM: 9.1 mg/dL (ref 8.6–10.2)
CO2: 26 mmol/L (ref 18–29)
CREATININE: 1.06 mg/dL (ref 0.76–1.27)
Chloride: 100 mmol/L (ref 96–106)
GFR calc Af Amer: 80 mL/min/{1.73_m2} (ref >59)
GFR, EST NON AFRICAN AMERICAN: 69 mL/min/{1.73_m2} (ref >59)
GLUCOSE: 104 mg/dL — AB (ref 65–99)
Potassium: 5.6 mmol/L — ABNORMAL HIGH (ref 3.5–5.2)
SODIUM: 140 mmol/L (ref 134–144)

## 2017-04-08 DIAGNOSIS — E875 Hyperkalemia: Secondary | ICD-10-CM | POA: Diagnosis not present

## 2017-04-08 DIAGNOSIS — I10 Essential (primary) hypertension: Secondary | ICD-10-CM | POA: Diagnosis not present

## 2017-04-09 ENCOUNTER — Other Ambulatory Visit: Payer: Self-pay

## 2017-04-09 DIAGNOSIS — I10 Essential (primary) hypertension: Secondary | ICD-10-CM

## 2017-04-09 DIAGNOSIS — I5042 Chronic combined systolic (congestive) and diastolic (congestive) heart failure: Secondary | ICD-10-CM

## 2017-04-09 LAB — BASIC METABOLIC PANEL
BUN / CREAT RATIO: 25 — AB (ref 10–24)
BUN: 28 mg/dL — ABNORMAL HIGH (ref 8–27)
CALCIUM: 8.9 mg/dL (ref 8.6–10.2)
CHLORIDE: 104 mmol/L (ref 96–106)
CO2: 26 mmol/L (ref 20–29)
Creatinine, Ser: 1.1 mg/dL (ref 0.76–1.27)
GFR calc non Af Amer: 66 mL/min/{1.73_m2} (ref >59)
GFR, EST AFRICAN AMERICAN: 76 mL/min/{1.73_m2} (ref >59)
GLUCOSE: 100 mg/dL — AB (ref 65–99)
POTASSIUM: 5.2 mmol/L (ref 3.5–5.2)
Sodium: 147 mmol/L — ABNORMAL HIGH (ref 134–144)

## 2017-05-13 ENCOUNTER — Other Ambulatory Visit: Payer: Self-pay | Admitting: Cardiology

## 2017-05-14 NOTE — Telephone Encounter (Signed)
Rx has been sent to the pharmacy electronically. ° °
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
## 2017-07-03 ENCOUNTER — Encounter: Payer: Self-pay | Admitting: Cardiology

## 2017-07-03 ENCOUNTER — Ambulatory Visit (INDEPENDENT_AMBULATORY_CARE_PROVIDER_SITE_OTHER): Payer: Medicare Other | Admitting: Cardiology

## 2017-07-03 VITALS — BP 132/94 | HR 84 | Ht 65.0 in | Wt 205.0 lb

## 2017-07-03 DIAGNOSIS — Z9861 Coronary angioplasty status: Secondary | ICD-10-CM | POA: Diagnosis not present

## 2017-07-03 DIAGNOSIS — I255 Ischemic cardiomyopathy: Secondary | ICD-10-CM | POA: Diagnosis not present

## 2017-07-03 DIAGNOSIS — I2510 Atherosclerotic heart disease of native coronary artery without angina pectoris: Secondary | ICD-10-CM

## 2017-07-03 DIAGNOSIS — I5042 Chronic combined systolic (congestive) and diastolic (congestive) heart failure: Secondary | ICD-10-CM | POA: Diagnosis not present

## 2017-07-03 DIAGNOSIS — E1169 Type 2 diabetes mellitus with other specified complication: Secondary | ICD-10-CM

## 2017-07-03 DIAGNOSIS — I10 Essential (primary) hypertension: Secondary | ICD-10-CM | POA: Diagnosis not present

## 2017-07-03 DIAGNOSIS — E782 Mixed hyperlipidemia: Secondary | ICD-10-CM | POA: Diagnosis not present

## 2017-07-03 DIAGNOSIS — E669 Obesity, unspecified: Secondary | ICD-10-CM

## 2017-07-03 NOTE — Addendum Note (Signed)
Addended by: Neoma Laming on: 07/03/2017 01:36 PM   Modules accepted: Orders

## 2017-07-03 NOTE — Patient Instructions (Signed)
Continue your current therapy  I will see you in 6 months.   

## 2017-07-09 DIAGNOSIS — I2510 Atherosclerotic heart disease of native coronary artery without angina pectoris: Secondary | ICD-10-CM | POA: Diagnosis not present

## 2017-07-09 DIAGNOSIS — Z5181 Encounter for therapeutic drug level monitoring: Secondary | ICD-10-CM | POA: Diagnosis not present

## 2017-07-09 DIAGNOSIS — E785 Hyperlipidemia, unspecified: Secondary | ICD-10-CM | POA: Diagnosis not present

## 2017-07-09 DIAGNOSIS — E1142 Type 2 diabetes mellitus with diabetic polyneuropathy: Secondary | ICD-10-CM | POA: Diagnosis not present

## 2017-07-10 DIAGNOSIS — H40013 Open angle with borderline findings, low risk, bilateral: Secondary | ICD-10-CM | POA: Diagnosis not present

## 2017-08-01 ENCOUNTER — Other Ambulatory Visit: Payer: Self-pay | Admitting: Cardiology

## 2017-09-02 ENCOUNTER — Other Ambulatory Visit: Payer: Self-pay | Admitting: *Deleted

## 2017-09-02 MED ORDER — SPIRONOLACTONE 25 MG PO TABS: ORAL_TABLET | ORAL | 1 refills | Status: DC

## 2017-10-28 ENCOUNTER — Other Ambulatory Visit: Payer: Self-pay | Admitting: Cardiology

## 2018-01-08 DIAGNOSIS — E119 Type 2 diabetes mellitus without complications: Secondary | ICD-10-CM | POA: Diagnosis not present

## 2018-01-09 DIAGNOSIS — E1142 Type 2 diabetes mellitus with diabetic polyneuropathy: Secondary | ICD-10-CM | POA: Diagnosis not present

## 2018-01-09 DIAGNOSIS — E785 Hyperlipidemia, unspecified: Secondary | ICD-10-CM | POA: Diagnosis not present

## 2018-01-09 DIAGNOSIS — Z5181 Encounter for therapeutic drug level monitoring: Secondary | ICD-10-CM | POA: Diagnosis not present

## 2018-01-09 DIAGNOSIS — I2510 Atherosclerotic heart disease of native coronary artery without angina pectoris: Secondary | ICD-10-CM | POA: Diagnosis not present

## 2018-01-27 NOTE — Progress Notes (Signed)
Cardiology Office Note   Date:  01/28/2018   ID:  [name] [birth year], MRN [id number]  PCP:  Talmage Coin, MD  Cardiologist: Dr. Swaziland  Chief Complaint  Patient presents with  . Follow-up  . Congestive Heart Failure    History of Present Illness: [name] is a 77 y.o. male who presents for ongoing assessment and management of coronary artery disease with history of anterior STEMI in 2008, cardiac catheterization revealing single-vessel disease of the LAD treated with PCI.  Other history includes diabetes, obesity, hypertension, hyperlipidemia, and a CVA.    He is also been hospitalized in 2017 in [place] for decompensated CHF, pulmonary edema.  Echocardiogram at that time revealed EF of 45% to 50% with severe hypokinesis of the anterior septum and akinesis of the anterior apex..  The patient was also found to have MR and TR.  It was noted during that hospitalization, that he was anemic and required blood transfusions.  A colonoscopy revealed a small polyp that was snared.  He was last seen by Dr. Swaziland on 07/03/2017 and was doing very well, he was without cardiac complaints.  It was noted that his insurance would not cover Entresto, he was continued on Coreg Lasix losartan Aldactone.  He was advised to continue his sodium restriction.  The patient comes today without any complaints.  He states he feels out of shape and would like to join [employer].  He is medically compliant.  He denies chest pain or dyspnea on exertion, he denies headache or dizziness.  Past Medical History:  Diagnosis Date  . CHF (congestive heart failure) (HCC)    past echo in 2010 showed an EF of 50 to 55% with mild AS and mild mitral insufficiency  . Coronary artery disease 2008   Prior anterior MI (with syncope as the initial presentation) with single vessel LAD occlusive artherosclerotic CAD -- post stent of the mid LAD  . CVA (cerebral vascular accident) (HCC) 2008   at the time  of his MI  . Diabetes mellitus   . Edema   . Heart murmur   . Hyperlipidemia   . Hypertension   . Long term current use of anticoagulant    previously on coumadin therapy.   . Mitral insufficiency   . Myocardial infarction, old 2008  . Obesity     Past Surgical History:  Procedure Laterality Date  . CORONARY ANGIOPLASTY WITH STENT PLACEMENT  04/10/2007   with drug-eluting stent placement of the mid LAD  . KNEE SURGERY       Current Outpatient Medications  Medication Sig Dispense Refill  . aspirin 81 MG tablet Take 81 mg by mouth daily.    . furosemide (LASIX) 20 MG tablet TAKE 1 TABLET ONCE DAILY. 90 tablet 5  . losartan (COZAAR) 100 MG tablet TAKE 1 TABLET ONCE DAILY. 90 tablet 5  . Lutein-Zeaxanthin (OCUVITE LUTEIN 25 PO) Take 1 capsule by mouth daily.    . metFORMIN (GLUCOPHAGE) 500 MG tablet Take 500 mg by mouth 2 (two) times daily with a meal.    . Multiple Vitamins-Minerals (CENTRUM SILVER PO) Take 1 tablet by mouth daily.     . nitroGLYCERIN (NITROSTAT) 0.4 MG SL tablet Place 0.4 mg under the tongue every 5 (five) minutes as needed for chest pain (x 3 doses).    Marland Kitchen atorvastatin (LIPITOR) 80 MG tablet TAKE 1 TABLET ONCE DAILY. 90 tablet 1  . carvedilol (COREG) 25 MG tablet TAKE 1 TABLET  BY MOUTH TWICE DAILY WITH A MEAL. 180 tablet 0  . spironolactone (ALDACTONE) 25 MG tablet TAKE 1/2 TABLET DAILY. 45 tablet 0   No current facility-administered medications for this visit.     Allergies:   Patient has no known allergies.    Social History:  The patient  reports that he quit smoking about 42 years ago. He has never used smokeless tobacco. He reports that he does not drink alcohol or use drugs.   Family History:  The patient's family history includes Dementia in his mother; Heart Problems in his father.    ROS: All other systems are reviewed and negative. Unless otherwise mentioned in H&P    PHYSICAL EXAM: VS:  BP (!) 154/82   Pulse 74   Ht 5\' 5"  (1.651 m)   Wt  209 lb 6.4 oz (95 kg)   BMI 34.85 kg/m  , BMI Body mass index is 34.85 kg/m. GEN: Well nourished, well developed, in no acute distress  HEENT: normal  Neck: no JVD, carotid bruits, or masses Cardiac: RRR; no murmurs, rubs, or gallops,no edema  Respiratory:  clear to auscultation bilaterally, normal work of breathing GI: soft, nontender, nondistended, + BS, obese MS: no deformity or atrophy  Skin: warm and dry, no rash Neuro:  Strength and sensation are intact Psych: euthymic mood, full affect   EKG: Not completed this office visit  Recent Labs: 03/07/2017: ALT 14; Brain Natriuretic Peptide 171.4; Hemoglobin 12.6; Platelets 224 04/08/2017: BUN 28; Creatinine, Ser 1.10; Potassium 5.2; Sodium 147    Lipid Panel    Component Value Date/Time   CHOL 101 03/07/2017 1025   TRIG 59 03/07/2017 1025   HDL 46 03/07/2017 1025   CHOLHDL 2.2 03/07/2017 1025   VLDL 12 03/07/2017 1025   LDLCALC 43 03/07/2017 1025      Wt Readings from Last 3 Encounters:  01/28/18 209 lb 6.4 oz (95 kg)  07/03/17 205 lb (93 kg)  03/12/17 202 lb 9.6 oz (91.9 kg)    ASSESSMENT AND PLAN:  1.  Chronic systolic CHF: The patient does not appear to be volume overloaded.  Weight is essentially the same as last office visits.  I would like to repeat his echocardiogram at some point to see if he has systolic function.  For now he will continue carvedilol 25 mg twice daily and losartan 100 mg daily.  He is also on spironolactone 12.5 mg daily.  He is recently had blood work completed by PCP we will request.  2.  Hypertension: Pressure is not well controlled today.  I did recheck it and it has decreased from 154/82 down to 140/72 on manual recheck.  He is advised to avoid salted foods and continue his medications as directed.  I have advised him to take his blood pressure twice a day to evaluate how he is doing outside of this office.  He verbalizes understanding.  If remains elevated will need to adjust medications.  He  is currently on losartan 100 mg daily and carvedilol 25 mg daily.  May need to increase spironolactone from 12.5 mg to 25 mg daily.  3.  Hyperlipidemia: Continue statin therapy.  4.  Diabetes: Followed by  PCP   Current medicines are reviewed at length with the patient today.    Labs/ tests ordered today include: None requesting labs from PCP  Bettey Mare. Liborio Klein, ANP, AACC   01/28/2018 5:14 PM    [facility] [address] [place],  [place] [address] Phone: [phone]; Fax: [phone]

## 2018-01-28 ENCOUNTER — Other Ambulatory Visit: Payer: Self-pay | Admitting: Cardiology

## 2018-01-28 ENCOUNTER — Ambulatory Visit: Payer: Medicare Other | Admitting: Adult Health

## 2018-01-28 ENCOUNTER — Encounter: Payer: Self-pay | Admitting: Adult Health

## 2018-01-28 VITALS — BP 154/82 | HR 74 | Ht 65.0 in | Wt 209.4 lb

## 2018-01-28 DIAGNOSIS — I10 Essential (primary) hypertension: Secondary | ICD-10-CM

## 2018-01-28 DIAGNOSIS — E7800 Pure hypercholesterolemia, unspecified: Secondary | ICD-10-CM | POA: Diagnosis not present

## 2018-01-28 DIAGNOSIS — I5022 Chronic systolic (congestive) heart failure: Secondary | ICD-10-CM

## 2018-01-28 NOTE — Patient Instructions (Signed)
Medication Instructions:  NO CHANGES- Your physician recommends that you continue on your current medications as directed. Please refer to the Current Medication list given to you today.  If you need a refill on your cardiac medications before your next appointment, please call your pharmacy.  Special Instructions: TAKE AND LOG BP DAILY AND BRING LOG WITH YOU TO APPOINTMENTS  OK TO START SILVER SNEAKERS  Follow-Up: Your physician wants you to follow-up in: 6 MONTHS WITH DR Swaziland. You should receive a reminder letter in the mail two months in advance. If you do not receive a letter, please call our office [phone] to schedule the 07-2018 follow-up appointment.   Thank you for choosing [facility] at [facility]!!

## 2018-04-26 ENCOUNTER — Other Ambulatory Visit: Payer: Self-pay | Admitting: Cardiology

## 2018-04-28 ENCOUNTER — Other Ambulatory Visit: Payer: Self-pay | Admitting: Cardiology

## 2018-04-28 NOTE — Telephone Encounter (Signed)
Rx(s) sent to pharmacy electronically.  

## 2018-07-09 DIAGNOSIS — E119 Type 2 diabetes mellitus without complications: Secondary | ICD-10-CM | POA: Diagnosis not present

## 2018-07-09 DIAGNOSIS — H40053 Ocular hypertension, bilateral: Secondary | ICD-10-CM | POA: Diagnosis not present

## 2018-07-15 DIAGNOSIS — E1142 Type 2 diabetes mellitus with diabetic polyneuropathy: Secondary | ICD-10-CM | POA: Diagnosis not present

## 2018-07-15 DIAGNOSIS — I2510 Atherosclerotic heart disease of native coronary artery without angina pectoris: Secondary | ICD-10-CM | POA: Diagnosis not present

## 2018-07-15 DIAGNOSIS — Z79899 Other long term (current) drug therapy: Secondary | ICD-10-CM | POA: Diagnosis not present

## 2018-07-15 DIAGNOSIS — E785 Hyperlipidemia, unspecified: Secondary | ICD-10-CM | POA: Diagnosis not present

## 2018-07-25 ENCOUNTER — Other Ambulatory Visit: Payer: Self-pay | Admitting: Cardiology

## 2018-07-25 NOTE — Telephone Encounter (Signed)
Rx request sent to pharmacy.  

## 2018-10-23 ENCOUNTER — Other Ambulatory Visit: Payer: Self-pay | Admitting: Cardiology

## 2019-01-07 DIAGNOSIS — E119 Type 2 diabetes mellitus without complications: Secondary | ICD-10-CM | POA: Diagnosis not present

## 2019-01-13 DIAGNOSIS — E1142 Type 2 diabetes mellitus with diabetic polyneuropathy: Secondary | ICD-10-CM | POA: Diagnosis not present

## 2019-01-13 DIAGNOSIS — E785 Hyperlipidemia, unspecified: Secondary | ICD-10-CM | POA: Diagnosis not present

## 2019-01-13 DIAGNOSIS — I2510 Atherosclerotic heart disease of native coronary artery without angina pectoris: Secondary | ICD-10-CM | POA: Diagnosis not present

## 2019-01-13 DIAGNOSIS — Z79899 Other long term (current) drug therapy: Secondary | ICD-10-CM | POA: Diagnosis not present

## 2019-01-19 ENCOUNTER — Other Ambulatory Visit: Payer: Self-pay | Admitting: Cardiology

## 2019-04-20 ENCOUNTER — Other Ambulatory Visit: Payer: Self-pay | Admitting: Cardiology

## 2019-04-21 NOTE — Telephone Encounter (Signed)
This is not anticoagulation

## 2019-04-22 ENCOUNTER — Other Ambulatory Visit: Payer: Self-pay | Admitting: Cardiology

## 2019-07-15 DIAGNOSIS — Z79899 Other long term (current) drug therapy: Secondary | ICD-10-CM | POA: Diagnosis not present

## 2019-07-15 DIAGNOSIS — I2510 Atherosclerotic heart disease of native coronary artery without angina pectoris: Secondary | ICD-10-CM | POA: Diagnosis not present

## 2019-07-15 DIAGNOSIS — E785 Hyperlipidemia, unspecified: Secondary | ICD-10-CM | POA: Diagnosis not present

## 2019-07-15 DIAGNOSIS — E1142 Type 2 diabetes mellitus with diabetic polyneuropathy: Secondary | ICD-10-CM | POA: Diagnosis not present

## 2019-07-20 ENCOUNTER — Other Ambulatory Visit: Payer: Self-pay | Admitting: Cardiology

## 2019-10-19 ENCOUNTER — Other Ambulatory Visit: Payer: Self-pay | Admitting: Cardiology

## 2020-01-13 ENCOUNTER — Other Ambulatory Visit: Payer: Self-pay | Admitting: Cardiology

## 2020-01-13 DIAGNOSIS — E1142 Type 2 diabetes mellitus with diabetic polyneuropathy: Secondary | ICD-10-CM | POA: Diagnosis not present

## 2020-01-13 DIAGNOSIS — E785 Hyperlipidemia, unspecified: Secondary | ICD-10-CM | POA: Diagnosis not present

## 2020-01-13 DIAGNOSIS — Z5181 Encounter for therapeutic drug level monitoring: Secondary | ICD-10-CM | POA: Diagnosis not present

## 2020-01-13 DIAGNOSIS — I2510 Atherosclerotic heart disease of native coronary artery without angina pectoris: Secondary | ICD-10-CM | POA: Diagnosis not present

## 2020-02-10 DIAGNOSIS — Z0120 Encounter for dental examination and cleaning without abnormal findings: Secondary | ICD-10-CM | POA: Diagnosis not present

## 2020-04-11 ENCOUNTER — Other Ambulatory Visit: Payer: Self-pay | Admitting: Cardiology

## 2020-04-13 ENCOUNTER — Telehealth: Payer: Self-pay

## 2020-04-13 ENCOUNTER — Telehealth (INDEPENDENT_AMBULATORY_CARE_PROVIDER_SITE_OTHER): Payer: Medicare Other | Admitting: Cardiology

## 2020-04-13 ENCOUNTER — Encounter: Payer: Self-pay | Admitting: Cardiology

## 2020-04-13 VITALS — Ht 65.0 in | Wt 200.1 lb

## 2020-04-13 DIAGNOSIS — I2510 Atherosclerotic heart disease of native coronary artery without angina pectoris: Secondary | ICD-10-CM

## 2020-04-13 DIAGNOSIS — I10 Essential (primary) hypertension: Secondary | ICD-10-CM

## 2020-04-13 DIAGNOSIS — E7800 Pure hypercholesterolemia, unspecified: Secondary | ICD-10-CM

## 2020-04-13 DIAGNOSIS — E669 Obesity, unspecified: Secondary | ICD-10-CM

## 2020-04-13 DIAGNOSIS — I5042 Chronic combined systolic (congestive) and diastolic (congestive) heart failure: Secondary | ICD-10-CM | POA: Diagnosis not present

## 2020-04-13 DIAGNOSIS — E1169 Type 2 diabetes mellitus with other specified complication: Secondary | ICD-10-CM

## 2020-04-13 MED ORDER — CARVEDILOL 25 MG PO TABS: 25.0000 mg | ORAL_TABLET | Freq: Two times a day (BID) | ORAL | 3 refills | Status: DC

## 2020-04-13 MED ORDER — ATORVASTATIN CALCIUM 80 MG PO TABS: 80.0000 mg | ORAL_TABLET | Freq: Every day | ORAL | 3 refills | Status: DC

## 2020-04-13 MED ORDER — FUROSEMIDE 20 MG PO TABS: 20.0000 mg | ORAL_TABLET | Freq: Every day | ORAL | 3 refills | Status: DC

## 2020-04-13 MED ORDER — LOSARTAN POTASSIUM 100 MG PO TABS: 100.0000 mg | ORAL_TABLET | Freq: Every day | ORAL | 3 refills | Status: DC

## 2020-04-13 MED ORDER — NITROGLYCERIN 0.4 MG SL SUBL: 0.4000 mg | SUBLINGUAL_TABLET | SUBLINGUAL | 11 refills | Status: DC | PRN

## 2020-04-13 MED ORDER — NITROGLYCERIN 0.4 MG SL SUBL: 0.4000 mg | SUBLINGUAL_TABLET | SUBLINGUAL | 6 refills | Status: DC | PRN

## 2020-04-13 MED ORDER — SPIRONOLACTONE 25 MG PO TABS: 12.5000 mg | ORAL_TABLET | Freq: Every day | ORAL | 2 refills | Status: DC

## 2020-04-13 MED ORDER — SPIRONOLACTONE 25 MG PO TABS: 12.5000 mg | ORAL_TABLET | Freq: Every day | ORAL | 3 refills | Status: DC

## 2020-04-13 NOTE — Addendum Note (Signed)
Addended by: Neoma Laming on: 04/13/2020 09:51 AM   Modules accepted: Orders

## 2020-04-13 NOTE — Telephone Encounter (Signed)
°  Patient Consent for Virtual Visit         [name] has provided verbal consent on 04/13/2020 for a virtual visit (video or telephone).   CONSENT FOR VIRTUAL VISIT FOR:  [name]  By participating in this virtual visit I agree to the following:  I hereby voluntarily request, consent and authorize [facility] and its employed or contracted physicians, physician assistants, nurse practitioners or other licensed health care professionals (the Practitioner), to provide me with telemedicine health care services (the Services") as deemed necessary by the treating Practitioner. I acknowledge and consent to receive the Services by the Practitioner via telemedicine. I understand that the telemedicine visit will involve communicating with the Practitioner through live audiovisual communication technology and the disclosure of certain medical information by electronic transmission. I acknowledge that I have been given the opportunity to request an in-person assessment or other available alternative prior to the telemedicine visit and am voluntarily participating in the telemedicine visit.  I understand that I have the right to withhold or withdraw my consent to the use of telemedicine in the course of my care at any time, without affecting my right to future care or treatment, and that the Practitioner or I may terminate the telemedicine visit at any time. I understand that I have the right to inspect all information obtained and/or recorded in the course of the telemedicine visit and may receive copies of available information for a reasonable fee.  I understand that some of the potential risks of receiving the Services via telemedicine include:   Delay or interruption in medical evaluation due to technological equipment failure or disruption;  Information transmitted may not be sufficient (e.g. poor resolution of images) to allow for appropriate medical decision making by the Practitioner;  and/or   In rare instances, security protocols could fail, causing a breach of personal health information.  Furthermore, I acknowledge that it is my responsibility to provide information about my medical history, conditions and care that is complete and accurate to the best of my ability. I acknowledge that Practitioner's advice, recommendations, and/or decision may be based on factors not within their control, such as incomplete or inaccurate data provided by me or distortions of diagnostic images or specimens that may result from electronic transmissions. I understand that the practice of medicine is not an exact science and that Practitioner makes no warranties or guarantees regarding treatment outcomes. I acknowledge that a copy of this consent can be made available to me via my patient portal [facility] MyChart), or I can request a printed copy by calling the office of [facility].    I understand that my insurance will be billed for this visit.   I have read or had this consent read to me.  I understand the contents of this consent, which adequately explains the benefits and risks of the Services being provided via telemedicine.   I have been provided ample opportunity to ask questions regarding this consent and the Services and have had my questions answered to my satisfaction.  I give my informed consent for the services to be provided through the use of telemedicine in my medical care
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
## 2020-04-13 NOTE — Patient Instructions (Signed)
Medication Instructions:  Continue same medications *If you need a refill on your cardiac medications before your next appointment, please call your pharmacy*   Lab Work: None ordered    Testing/Procedures: None ordered   Follow-Up: At [facility], you and your health needs are our priority.  As part of our continuing mission to provide you with exceptional heart care, we have created designated Provider Care Teams.  These Care Teams include your primary Cardiologist (physician) and Advanced Practice Providers (APPs -  Physician Assistants and Nurse Practitioners) who all work together to provide you with the care you need, when you need it.  We recommend signing up for the patient portal called "MyChart".  Sign up information is provided on this After Visit Summary.  MyChart is used to connect with patients for Virtual Visits (Telemedicine).  Patients are able to view lab/test results, encounter notes, upcoming appointments, etc.  Non-urgent messages can be sent to your provider as well.   To learn more about what you can do with MyChart, go to [email or web address].      Your next appointment:  12 months  Call in March to schedule June appointment.   The format for your next appointment:  Office   Provider: Dr.Jordan

## 2020-04-15 ENCOUNTER — Other Ambulatory Visit: Payer: Self-pay | Admitting: Cardiology

## 2020-07-14 DIAGNOSIS — Z79899 Other long term (current) drug therapy: Secondary | ICD-10-CM | POA: Diagnosis not present

## 2020-07-14 DIAGNOSIS — E785 Hyperlipidemia, unspecified: Secondary | ICD-10-CM | POA: Diagnosis not present

## 2020-07-14 DIAGNOSIS — E1142 Type 2 diabetes mellitus with diabetic polyneuropathy: Secondary | ICD-10-CM | POA: Diagnosis not present

## 2020-07-14 DIAGNOSIS — I2510 Atherosclerotic heart disease of native coronary artery without angina pectoris: Secondary | ICD-10-CM | POA: Diagnosis not present

## 2020-09-29 DIAGNOSIS — Z0120 Encounter for dental examination and cleaning without abnormal findings: Secondary | ICD-10-CM | POA: Diagnosis not present

## 2020-09-30 ENCOUNTER — Ambulatory Visit: Payer: Medicare Other | Attending: Internal Medicine

## 2020-09-30 DIAGNOSIS — Z23 Encounter for immunization: Secondary | ICD-10-CM

## 2020-09-30 NOTE — Progress Notes (Signed)
   Covid-19 Vaccination Clinic  Name:  SIAOSI ALTER    MRN: [id number] DOB: 03-Mar-1941  09/30/2020  Mr. Benningfield was observed post Covid-19 immunization for 15 minutes without incident. He was provided with Vaccine Information Sheet and instruction to access the V-Safe system.   Mr. Nearhood was instructed to call 911 with any severe reactions post vaccine: Marland Kitchen Difficulty breathing  . Swelling of face and throat  . A fast heartbeat  . A bad rash all over body  . Dizziness and weakness   Immunizations Administered    Name Date Dose VIS Date Route   Pfizer COVID-19 Vaccine 09/30/2020  1:29 PM 0.3 mL 08/17/2020 Intramuscular   Manufacturer: [employer], [employer]   Lot: [id number]   NDC: [address]-2

## 2021-01-11 DIAGNOSIS — E538 Deficiency of other specified B group vitamins: Secondary | ICD-10-CM | POA: Diagnosis not present

## 2021-01-11 DIAGNOSIS — I2510 Atherosclerotic heart disease of native coronary artery without angina pectoris: Secondary | ICD-10-CM | POA: Diagnosis not present

## 2021-01-11 DIAGNOSIS — E1142 Type 2 diabetes mellitus with diabetic polyneuropathy: Secondary | ICD-10-CM | POA: Diagnosis not present

## 2021-01-11 DIAGNOSIS — Z7185 Encounter for immunization safety counseling: Secondary | ICD-10-CM | POA: Diagnosis not present

## 2021-01-11 DIAGNOSIS — E785 Hyperlipidemia, unspecified: Secondary | ICD-10-CM | POA: Diagnosis not present

## 2021-01-26 DIAGNOSIS — R944 Abnormal results of kidney function studies: Secondary | ICD-10-CM | POA: Diagnosis not present

## 2021-03-23 DIAGNOSIS — H524 Presbyopia: Secondary | ICD-10-CM | POA: Diagnosis not present

## 2021-04-03 DIAGNOSIS — H2513 Age-related nuclear cataract, bilateral: Secondary | ICD-10-CM | POA: Diagnosis not present

## 2021-04-03 DIAGNOSIS — H25013 Cortical age-related cataract, bilateral: Secondary | ICD-10-CM | POA: Diagnosis not present

## 2021-04-21 ENCOUNTER — Other Ambulatory Visit: Payer: Self-pay | Admitting: Cardiology

## 2021-05-02 DIAGNOSIS — H25011 Cortical age-related cataract, right eye: Secondary | ICD-10-CM | POA: Diagnosis not present

## 2021-05-02 DIAGNOSIS — H2511 Age-related nuclear cataract, right eye: Secondary | ICD-10-CM | POA: Diagnosis not present

## 2021-05-02 DIAGNOSIS — H25811 Combined forms of age-related cataract, right eye: Secondary | ICD-10-CM | POA: Diagnosis not present

## 2021-05-30 DIAGNOSIS — H2512 Age-related nuclear cataract, left eye: Secondary | ICD-10-CM | POA: Diagnosis not present

## 2021-05-30 DIAGNOSIS — H25012 Cortical age-related cataract, left eye: Secondary | ICD-10-CM | POA: Diagnosis not present

## 2021-05-30 DIAGNOSIS — H25812 Combined forms of age-related cataract, left eye: Secondary | ICD-10-CM | POA: Diagnosis not present
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
## 2021-06-23 ENCOUNTER — Other Ambulatory Visit: Payer: Self-pay

## 2021-06-23 ENCOUNTER — Ambulatory Visit: Payer: Medicare Other | Admitting: Physician Assistant

## 2021-06-23 ENCOUNTER — Encounter: Payer: Self-pay | Admitting: Physician Assistant

## 2021-06-23 VITALS — BP 134/76 | HR 75 | Ht 65.0 in | Wt 203.0 lb

## 2021-06-23 DIAGNOSIS — I2510 Atherosclerotic heart disease of native coronary artery without angina pectoris: Secondary | ICD-10-CM | POA: Diagnosis not present

## 2021-06-23 DIAGNOSIS — E669 Obesity, unspecified: Secondary | ICD-10-CM

## 2021-06-23 DIAGNOSIS — E119 Type 2 diabetes mellitus without complications: Secondary | ICD-10-CM

## 2021-06-23 DIAGNOSIS — E7800 Pure hypercholesterolemia, unspecified: Secondary | ICD-10-CM

## 2021-06-23 DIAGNOSIS — I10 Essential (primary) hypertension: Secondary | ICD-10-CM | POA: Diagnosis not present

## 2021-06-23 DIAGNOSIS — R011 Cardiac murmur, unspecified: Secondary | ICD-10-CM

## 2021-06-23 DIAGNOSIS — E1169 Type 2 diabetes mellitus with other specified complication: Secondary | ICD-10-CM

## 2021-06-23 DIAGNOSIS — I5042 Chronic combined systolic (congestive) and diastolic (congestive) heart failure: Secondary | ICD-10-CM

## 2021-06-23 MED ORDER — NITROGLYCERIN 0.4 MG SL SUBL: 0.4000 mg | SUBLINGUAL_TABLET | SUBLINGUAL | 11 refills | Status: DC | PRN

## 2021-06-23 NOTE — Patient Instructions (Signed)
Medication Instructions:  No Changes *If you need a refill on your cardiac medications before your next appointment, please call your pharmacy*   Lab Work: No Labs If you have labs (blood work) drawn today and your tests are completely normal, you will receive your results only by: MyChart Message (if you have MyChart) OR A paper copy in the mail If you have any lab test that is abnormal or we need to change your treatment, we will call you to review the results.   Testing/Procedures: [address], [address] Your physician has requested that you have an echocardiogram. Echocardiography is a painless test that uses sound waves to create images of your heart. It provides your doctor with information about the size and shape of your heart and how well your heart's chambers and valves are working. This procedure takes approximately one hour. There are no restrictions for this procedure.    Follow-Up: At [facility], you and your health needs are our priority.  As part of our continuing mission to provide you with exceptional heart care, we have created designated Provider Care Teams.  These Care Teams include your primary Cardiologist (physician) and Advanced Practice Providers (APPs -  Physician Assistants and Nurse Practitioners) who all work together to provide you with the care you need, when you need it.   Your next appointment:   1 year(s)  The format for your next appointment:   In Person  Provider:   Peter Sw[place]il[place], MD

## 2021-07-13 ENCOUNTER — Other Ambulatory Visit: Payer: Self-pay

## 2021-07-13 ENCOUNTER — Ambulatory Visit (HOSPITAL_COMMUNITY): Payer: Medicare Other | Attending: Cardiovascular Disease

## 2021-07-13 DIAGNOSIS — I10 Essential (primary) hypertension: Secondary | ICD-10-CM | POA: Diagnosis not present

## 2021-07-13 DIAGNOSIS — R011 Cardiac murmur, unspecified: Secondary | ICD-10-CM | POA: Diagnosis not present

## 2021-07-13 MED ORDER — PERFLUTREN LIPID MICROSPHERE
1.0000 mL | INTRAVENOUS | Status: AC | PRN
  Administered 2021-07-13: 1 mL via INTRAVENOUS

## 2021-07-14 LAB — ECHOCARDIOGRAM COMPLETE
AR max vel: 2.49 cm2
AV Area VTI: 2.32 cm2
AV Area mean vel: 2.4 cm2
AV Mean grad: 8 mmHg
AV Peak grad: 14.7 mmHg
Ao pk vel: 1.92 m/s
Area-P 1/2: 4.15 cm2
S' Lateral: 3.4 cm

## 2021-07-18 DIAGNOSIS — Z7185 Encounter for immunization safety counseling: Secondary | ICD-10-CM | POA: Diagnosis not present

## 2021-07-18 DIAGNOSIS — E1165 Type 2 diabetes mellitus with hyperglycemia: Secondary | ICD-10-CM | POA: Diagnosis not present

## 2021-07-18 DIAGNOSIS — E1142 Type 2 diabetes mellitus with diabetic polyneuropathy: Secondary | ICD-10-CM | POA: Diagnosis not present

## 2021-07-18 DIAGNOSIS — E538 Deficiency of other specified B group vitamins: Secondary | ICD-10-CM | POA: Diagnosis not present

## 2021-07-18 DIAGNOSIS — Z7984 Long term (current) use of oral hypoglycemic drugs: Secondary | ICD-10-CM | POA: Diagnosis not present

## 2021-07-18 DIAGNOSIS — I2510 Atherosclerotic heart disease of native coronary artery without angina pectoris: Secondary | ICD-10-CM | POA: Diagnosis not present

## 2021-07-18 DIAGNOSIS — E785 Hyperlipidemia, unspecified: Secondary | ICD-10-CM | POA: Diagnosis not present

## 2021-07-21 ENCOUNTER — Other Ambulatory Visit: Payer: Self-pay | Admitting: Cardiology

## 2021-07-25 ENCOUNTER — Encounter: Payer: Self-pay | Admitting: *Deleted

## 2022-01-17 DIAGNOSIS — E1142 Type 2 diabetes mellitus with diabetic polyneuropathy: Secondary | ICD-10-CM | POA: Diagnosis not present

## 2022-01-17 DIAGNOSIS — E785 Hyperlipidemia, unspecified: Secondary | ICD-10-CM | POA: Diagnosis not present

## 2022-01-17 DIAGNOSIS — Z8639 Personal history of other endocrine, nutritional and metabolic disease: Secondary | ICD-10-CM | POA: Diagnosis not present

## 2022-01-17 DIAGNOSIS — I2510 Atherosclerotic heart disease of native coronary artery without angina pectoris: Secondary | ICD-10-CM | POA: Diagnosis not present

## 2022-07-06 DIAGNOSIS — H52203 Unspecified astigmatism, bilateral: Secondary | ICD-10-CM | POA: Diagnosis not present

## 2022-07-19 ENCOUNTER — Other Ambulatory Visit: Payer: Self-pay | Admitting: Cardiology

## 2022-07-20 DIAGNOSIS — I2510 Atherosclerotic heart disease of native coronary artery without angina pectoris: Secondary | ICD-10-CM | POA: Diagnosis not present

## 2022-07-20 DIAGNOSIS — E1142 Type 2 diabetes mellitus with diabetic polyneuropathy: Secondary | ICD-10-CM | POA: Diagnosis not present

## 2022-07-20 DIAGNOSIS — E785 Hyperlipidemia, unspecified: Secondary | ICD-10-CM | POA: Diagnosis not present

## 2022-07-20 DIAGNOSIS — Z8639 Personal history of other endocrine, nutritional and metabolic disease: Secondary | ICD-10-CM | POA: Diagnosis not present

## 2022-08-16 NOTE — Progress Notes (Signed)
Cardiology Office Note:    Date:  08/23/2022   ID:  [name], DOB [birth year], MRN [id number]  PCP:  Talmage Coin, MD  Cardiologist:  Peter [place], MD  Electrophysiologist:  None   Referring MD: Talmage Coin, MD   Chief Complaint: follow-up of CAD  History of Present Illness:     [name] is a 81 y.o. male with a history of CAD with anterior STEMI in 2008 s/p DES to LAD, ischemic cardiomyopathy/ chronic combined CHF with EF of 45-50% on Echo in 06/2021, mild mitral regurgitation, hypertension, hyperlipidemia, type 2 diabetes , CVA at time of MI, and obesity who is followed by Dr. [place] and presents today for routine follow-up of CAD.   Patient was admitted in 2008 for anterior STEMI after presenting after a syncopal event while driving home from work. He underwent successful PCI with stenting to LAD. He also had a CVA at the time of his MI. Myoview in 2015 showed a predominant anteroapical scare with some peri-infarct ischemia and EF of 55%. He was admitted in [facility] in 2017 for decompensated CHF and pulmonary edema. Echo at that time showed LVEF with mildly reduced EF of 45-50%, akinesis of the anterior apex, and hypokinesis of the anterior septum as well as MR and TR. He underwent repeat cardiac catheterization which showed patent LAD stent and otherwise no obstructive CAD. He was also noted to be anemic during that admission and required 2 units of PRBCs.   Patient was last seen by Bettina Gavia, PA-C, in 05/2021 at which time he was doing well with no cardiac complaints. Repeat Echo was ordered for further evaluation of murmur on exam and showed LVEF of 45-50% with small LV apical aneurysm (without any evidence of thrombus) and hypokinesis of the mid anteroseptal and apical anterior segments as well as grade 1 diastolic dysfunction, mild MR, and mild to moderate aortic sclerosis but no AS.  Patient presents today for follow-up. Patient has been doing very well from a cardiac  standpoint. He denies any chest pain, shortness of breath, orthopnea, PND. He does have some chronic lower extremity but this is stable. No palpitations, lightheadedness, dizziness, or syncope. He is compliant with all his medication and is tolerating them well. He weighs himself daily and his weights have been stable. He also watches his salt intake. He states he is less active since the pandemic and so his endurance is not as good as it once was but he states the limiting factor is his bilateral knee pain. He denies any shortness of breath or chest pain with activity.  Past Medical History:  Diagnosis Date   CHF (congestive heart failure) (HCC)    past echo in 2010 showed an EF of 50 to 55% with mild AS and mild mitral insufficiency   Coronary artery disease 2008   Prior anterior MI (with syncope as the initial presentation) with single vessel LAD occlusive artherosclerotic CAD -- post stent of the mid LAD   CVA (cerebral vascular accident) (HCC) 2008   at the time of his MI   Diabetes mellitus    Edema    Heart murmur    Hyperlipidemia    Hypertension    Long term current use of anticoagulant    previously on coumadin therapy.    Mitral insufficiency    Myocardial infarction, old 2008   Obesity     Past Surgical History:  Procedure Laterality Date   CORONARY ANGIOPLASTY WITH STENT PLACEMENT  04/10/2007   with drug-eluting stent placement of the mid LAD   KNEE SURGERY      Current Medications: Current Meds  Medication Sig   aspirin 81 MG tablet Take 81 mg by mouth daily.   atorvastatin (LIPITOR) 80 MG tablet Take 1 tablet (80 mg total) by mouth daily. KEEP UPCOMING APPOINTMENT FOR FUTURE REFILLS.   carvedilol (COREG) 25 MG tablet TAKE ONE TABLET BY MOUTH TWICE DAILY WITH A MEAL **NEED OFFICE VISIT**   furosemide (LASIX) 20 MG tablet Take 1 tablet (20 mg total) by mouth daily. KEEP UPCOMING APPOINTMENT FOR FUTURE REFILLS.   losartan (COZAAR) 100 MG tablet Take 1 tablet (100 mg  total) by mouth daily. KEEP UPCOMING OFFICE VISIT FOR FUTURE REFILLS.   Lutein-Zeaxanthin (OCUVITE LUTEIN 25 PO) Take 1 capsule by mouth daily.   metFORMIN (GLUCOPHAGE) 500 MG tablet Take 500 mg by mouth 2 (two) times daily with a meal.   Multiple Vitamins-Minerals (CENTRUM SILVER PO) Take 1 tablet by mouth daily.    nitroGLYCERIN (NITROSTAT) 0.4 MG SL tablet Place 1 tablet (0.4 mg total) under the tongue every 5 (five) minutes as needed for chest pain (x 3 doses).   spironolactone (ALDACTONE) 25 MG tablet TAKE 1/2 TABLET BY MOUTH DAILY **NEED OFFICE VISIT**   [DISCONTINUED] ramipril (ALTACE) 2.5 MG capsule Take 1 tablet by mouth daily.     Allergies:   Patient has no known allergies.   Social History   Socioeconomic History   Marital status: Widowed    Spouse name: Not on file   Number of children: Not on file   Years of education: Not on file   Highest education level: Not on file  Occupational History   Not on file  Tobacco Use   Smoking status: Former    Types: Cigarettes    Quit date: 03/30/1975    Years since quitting: 47.4   Smokeless tobacco: Never  Substance and Sexual Activity   Alcohol use: No   Drug use: No   Sexual activity: Never  Other Topics Concern   Not on file  Social History Narrative   Not on file   Social Determinants of Health   Financial Resource Strain: Not on file  Food Insecurity: Not on file  Transportation Needs: Not on file  Physical Activity: Not on file  Stress: Not on file  Social Connections: Not on file     Family History: The patient's family history includes Dementia in his mother; Heart Problems in his father. There is no history of Coronary artery disease or Congestive Heart Failure.  ROS:   Please see the history of present illness.     EKGs/Labs/Other Studies Reviewed:    The following studies were reviewed:  Echocardiogram 07/13/2021: Impressions:  1. Small left ventricular apical aneurysm, eithout thrombus (Definity   contrast). Left ventricular ejection fraction, by estimation, is 45 to  50%. The left ventricle has mildly decreased function. The left ventricle  demonstrates regional wall motion  abnormalities (see scoring diagram/findings for description). Left  ventricular diastolic parameters are consistent with Grade I diastolic  dysfunction (impaired relaxation).   2. Right ventricular systolic function is normal. The right ventricular  size is normal. There is normal pulmonary artery systolic pressure. The  estimated right ventricular systolic pressure is 24.3 mmHg.   3. Left atrial size was mild to moderately dilated.   4. The mitral valve is normal in structure. Mild mitral valve  regurgitation.   5. The aortic valve is tricuspid. There  is mild calcification of the  aortic valve. There is mild thickening of the aortic valve. Aortic valve  regurgitation is not visualized. Mild to moderate aortic valve  sclerosis/calcification is present, without any  evidence of aortic stenosis.  EKG:  EKG ordered today. EKG personally reviewed and demonstrates sinus bradycardia, rate 59 bpm, with 1st degree AV block with non-specific ST/T changes. No significant changes compared to prior tracing.   Recent Labs: No results found for requested labs within last 365 days.  Recent Lipid Panel    Component Value Date/Time   CHOL 101 03/07/2017 1025   TRIG 59 03/07/2017 1025   HDL 46 03/07/2017 1025   CHOLHDL 2.2 03/07/2017 1025   VLDL 12 03/07/2017 1025   LDLCALC 43 03/07/2017 1025    Physical Exam:    Vital Signs: BP 122/64   Pulse (!) 59   Ht 5\' 5"  (1.651 m)   Wt 204 lb 9.6 oz (92.8 kg)   SpO2 95%   BMI 34.05 kg/m     Wt Readings from Last 3 Encounters:  08/23/22 204 lb 9.6 oz (92.8 kg)  06/23/21 203 lb (92.1 kg)  04/13/20 200 lb 2 oz (90.8 kg)     General: 81 y.o.  Caucasian male in no acute distress. HEENT: Normocephalic and atraumatic. Sclera clear.  Neck: Supple. No carotid bruits. No  JVD. Heart: RRR. Distinct S1 and S2. Soft I-II/VI systolic murmur. No gallops or rubs. Radial pulses 2+ and equal bilaterally. Lungs: No increased work of breathing. Clear to ausculation bilaterally. No wheezes, rhonchi, or rales.  Abdomen: Soft, non-distended, and non-tender to palpation.  Extremities: 1+ pitting edema of bilateral lower extremities (mostly around ankles). Skin: Warm and dry. Neuro: Alert and oriented x3. No focal deficits. Psych: Normal affect. Responds appropriately.   Assessment:    1. Coronary artery disease involving native coronary artery of native heart without angina pectoris   2. Ischemic cardiomyopathy   3. Chronic combined systolic and diastolic CHF (congestive heart failure) ([place])   4. Mitral valve insufficiency, unspecified etiology   5. Primary hypertension   6. Hyperlipidemia, unspecified hyperlipidemia type   7. Type 2 diabetes mellitus with complication, without long-term current use of insulin (HCC)     Plan:    CAD History of anterior STEMI in 2008 s/p DES to LAD. Last cardiac catheterization in 2017 in [facility] showed patent stent with otherwise no obstructive CAD. - No chest pain.  - Continue aspirin, beta-blocker, and statin.  Chronic Combined CHF Ischemic Cardiomyopathy Last Echo in 06/2021 showed LVEF of 45-50% with small LV apical aneurysm (without any evidence of thrombus) and hypokinesis of the mid anteroseptal and apical anterior segments as well as grade 1 diastolic dysfunction, mild MR, and mild to moderate aortic sclerosis but no AS. - He has chronic lower extremity edema but this is stable. Otherwise, euvolemic on exam. Weights stable. - Continue Lasix 20mg  daily. - Continue Losartan 100mg  daily. Patient also has Ramipril on his medication list but he is not taking this. Will remove from med list. - Continue Coreg 25mg  twice daily. - Continue Spironolactone 12.5mg  daily. - Continue daily weight and sodium restrictions. - Patient states  he recently had labs checked at Endocrinology's office (Dr. Buddy Duty). Will request a copy of these be faxed to [place].  Mild Mitral Regurgitation Noted on Echo in 06/2021. - Continue routine serial monitoring. Will need repeat Echo in 2-4 years.  Hypertension BP well controlled.  - Continue medications for CHF as above.  Hyperlipidemia Most recent lipid panel in 12/2021: Total Cholesterol 112, Triglycerides 90, HDL 43, LDL 51.  - Continue Lipitor 80mg  daily.  Type 2 Diabetes Mellitus Hemoglobin A1c 5.9% in 06/2022. - On Metformin. - Management per Endocrinology.  Disposition: Follow up in 1 year.   Medication Adjustments/Labs and Tests Ordered: Current medicines are reviewed at length with the patient today.  Concerns regarding medicines are outlined above.  Orders Placed This Encounter  Procedures   EKG 12-Lead   No orders of the defined types were placed in this encounter.   Patient Instructions  Medication Instructions:  No Changes In Medications at this time.  *If you need a refill on your cardiac medications before your next appointment, please call your pharmacy*  Lab Work: None Ordered At This Time.  If you have labs (blood work) drawn today and your tests are completely normal, you will receive your results only by: MyChart Message (if you have MyChart) OR A paper copy in the mail If you have any lab test that is abnormal or we need to change your treatment, we will call you to review the results.  Testing/Procedures: None Ordered At This Time.   Follow-Up: At [facility], you and your health needs are our priority.  As part of our continuing mission to provide you with exceptional heart care, we have created designated Provider Care Teams.  These Care Teams include your primary Cardiologist (physician) and Advanced Practice Providers (APPs -  Physician Assistants and Nurse Practitioners) who all work together to provide you with the care you need, when you need  it.  Your next appointment:   1 year(s)  The format for your next appointment:   In Person  Provider:    Dr. [facility]   Other Instructions  Heart Failure Education Weigh yourself EVERY morning after you go to the bathroom but before you eat or drink anything. Write this number down in a weight log/diary. If you gain 3 pounds overnight or 5 pounds in a week, call the office. Take your medicines as prescribed. If you have concerns about your medications, please call [place] before you stop taking them.  Eat low salt foods--Limit salt (sodium) to 2000 mg per day. This will help prevent your body from holding onto fluid. Read food labels as many processed foods have a lot of sodium, especially canned goods and prepackaged meats. If you would like some assistance choosing low sodium foods, we would be happy to set you up with a nutritionist. Limit all fluids for the day to less than 2 liters (64 ounces). Fluid includes all drinks, coffee, juice, ice chips, soup, jello, and all other liquids. Stay as active as you can everyday. Staying active will give you more energy and make your muscles stronger. Start with 5 minutes at a time and work your way up to 30 minutes a day. Break up your activities--do some in the morning and some in the afternoon. Start with 3 days per week and work your way up to 5 days as you can.  If you have chest pain, feel short of breath, dizzy, or lightheaded, STOP. If you don't feel better after a short rest, call 911. If you do feel better, call the office to let us know you have symptoms with exercise.    Signed, [place], PA-C  08/23/2022 4:39 PM    [facility]

## 2022-08-17 ENCOUNTER — Other Ambulatory Visit: Payer: Self-pay | Admitting: Cardiology

## 2022-08-23 ENCOUNTER — Encounter: Payer: Self-pay | Admitting: Student

## 2022-08-23 ENCOUNTER — Ambulatory Visit: Payer: Medicare Other | Attending: Student | Admitting: Student

## 2022-08-23 VITALS — BP 122/64 | HR 59 | Ht 65.0 in | Wt 204.6 lb

## 2022-08-23 DIAGNOSIS — I5042 Chronic combined systolic (congestive) and diastolic (congestive) heart failure: Secondary | ICD-10-CM | POA: Diagnosis not present

## 2022-08-23 DIAGNOSIS — I255 Ischemic cardiomyopathy: Secondary | ICD-10-CM

## 2022-08-23 DIAGNOSIS — E118 Type 2 diabetes mellitus with unspecified complications: Secondary | ICD-10-CM

## 2022-08-23 DIAGNOSIS — I2510 Atherosclerotic heart disease of native coronary artery without angina pectoris: Secondary | ICD-10-CM

## 2022-08-23 DIAGNOSIS — I340 Nonrheumatic mitral (valve) insufficiency: Secondary | ICD-10-CM

## 2022-08-23 DIAGNOSIS — I10 Essential (primary) hypertension: Secondary | ICD-10-CM

## 2022-08-23 DIAGNOSIS — E785 Hyperlipidemia, unspecified: Secondary | ICD-10-CM

## 2022-08-23 NOTE — Patient Instructions (Signed)
Medication Instructions:  No Changes In Medications at this time.  *If you need a refill on your cardiac medications before your next appointment, please call your pharmacy*  Lab Work: None Ordered At This Time.  If you have labs (blood work) drawn today and your tests are completely normal, you will receive your results only by: [place] (if you have MyChart) OR A paper copy in the mail If you have any lab test that is abnormal or we need to change your treatment, we will call you to review the results.  Testing/Procedures: None Ordered At This Time.   Follow-Up: At [facility], you and your health needs are our priority.  As part of our continuing mission to provide you with exceptional heart care, we have created designated Provider Care Teams.  These Care Teams include your primary Cardiologist (physician) and Advanced Practice Providers (APPs -  Physician Assistants and Nurse Practitioners) who all work together to provide you with the care you need, when you need it.  Your next appointment:   1 year(s)  The format for your next appointment:   In Person  Provider:    Dr. [place]   Other Instructions  Heart Failure Education Weigh yourself EVERY morning after you go to the bathroom but before you eat or drink anything. Write this number down in a weight log/diary. If you gain 3 pounds overnight or 5 pounds in a week, call the office. Take your medicines as prescribed. If you have concerns about your medications, please call us before you stop taking them.  Eat low salt foods--Limit salt (sodium) to 2000 mg per day. This will help prevent your body from holding onto fluid. Read food labels as many processed foods have a lot of sodium, especially canned goods and prepackaged meats. If you would like some assistance choosing low sodium foods, we would be happy to set you up with a nutritionist. Limit all fluids for the day to less than 2 liters (64 ounces). Fluid  includes all drinks, coffee, juice, ice chips, soup, jello, and all other liquids. Stay as active as you can everyday. Staying active will give you more energy and make your muscles stronger. Start with 5 minutes at a time and work your way up to 30 minutes a day. Break up your activities--do some in the morning and some in the afternoon. Start with 3 days per week and work your way up to 5 days as you can.  If you have chest pain, feel short of breath, dizzy, or lightheaded, STOP. If you don't feel better after a short rest, call 911. If you do feel better, call the office to let us know you have symptoms with exercise.

## 2022-11-16 ENCOUNTER — Other Ambulatory Visit: Payer: Self-pay | Admitting: Cardiology

## 2022-11-16 ENCOUNTER — Telehealth: Payer: Self-pay

## 2022-11-16 MED ORDER — SPIRONOLACTONE 25 MG PO TABS: ORAL_TABLET | ORAL | 3 refills | Status: DC

## 2022-11-16 NOTE — Telephone Encounter (Signed)
Spoke to patient I was calling to verify Spironolactone dose.Stated he is taking 25 mg 1/2 tablet ( 12.5 mg ) daily.Advised I will send in a 90 day refill to your pharmacy.

## 2023-01-18 DIAGNOSIS — E1142 Type 2 diabetes mellitus with diabetic polyneuropathy: Secondary | ICD-10-CM | POA: Diagnosis not present

## 2023-01-18 DIAGNOSIS — N1831 Chronic kidney disease, stage 3a: Secondary | ICD-10-CM | POA: Diagnosis not present

## 2023-01-18 DIAGNOSIS — E785 Hyperlipidemia, unspecified: Secondary | ICD-10-CM | POA: Diagnosis not present

## 2023-01-18 DIAGNOSIS — I2510 Atherosclerotic heart disease of native coronary artery without angina pectoris: Secondary | ICD-10-CM | POA: Diagnosis not present

## 2023-01-18 DIAGNOSIS — Z8639 Personal history of other endocrine, nutritional and metabolic disease: Secondary | ICD-10-CM | POA: Diagnosis not present

## 2023-01-29 DIAGNOSIS — K080 Exfoliation of teeth due to systemic causes: Secondary | ICD-10-CM | POA: Diagnosis not present

## 2023-03-21 ENCOUNTER — Other Ambulatory Visit: Payer: Self-pay | Admitting: Cardiology

## 2023-03-31 ENCOUNTER — Other Ambulatory Visit: Payer: Self-pay | Admitting: Cardiology

## 2023-04-02 ENCOUNTER — Other Ambulatory Visit: Payer: Self-pay | Admitting: Cardiology

## 2023-07-02 ENCOUNTER — Telehealth: Payer: Self-pay | Admitting: Cardiology

## 2023-07-02 NOTE — Telephone Encounter (Signed)
Pt called in to inform Dr. [place] and his nurse Elnita Maxwell, RN that his PCP Dr. Sharl Ma added Marcelline Deist 5 mg once daily to his med list

## 2023-07-08 DIAGNOSIS — H52203 Unspecified astigmatism, bilateral: Secondary | ICD-10-CM | POA: Diagnosis not present

## 2023-07-09 ENCOUNTER — Other Ambulatory Visit: Payer: Self-pay | Admitting: Cardiology

## 2023-07-10 ENCOUNTER — Other Ambulatory Visit: Payer: Self-pay | Admitting: Cardiology

## 2023-07-19 DIAGNOSIS — N1831 Chronic kidney disease, stage 3a: Secondary | ICD-10-CM | POA: Diagnosis not present

## 2023-07-19 DIAGNOSIS — E785 Hyperlipidemia, unspecified: Secondary | ICD-10-CM | POA: Diagnosis not present

## 2023-07-19 DIAGNOSIS — Z8639 Personal history of other endocrine, nutritional and metabolic disease: Secondary | ICD-10-CM | POA: Diagnosis not present

## 2023-07-19 DIAGNOSIS — I2510 Atherosclerotic heart disease of native coronary artery without angina pectoris: Secondary | ICD-10-CM | POA: Diagnosis not present

## 2023-07-19 DIAGNOSIS — E1142 Type 2 diabetes mellitus with diabetic polyneuropathy: Secondary | ICD-10-CM | POA: Diagnosis not present

## 2023-07-19 DIAGNOSIS — Z23 Encounter for immunization: Secondary | ICD-10-CM | POA: Diagnosis not present

## 2023-08-07 DIAGNOSIS — R7989 Other specified abnormal findings of blood chemistry: Secondary | ICD-10-CM | POA: Diagnosis not present

## 2023-08-12 DIAGNOSIS — K080 Exfoliation of teeth due to systemic causes: Secondary | ICD-10-CM | POA: Diagnosis not present

## 2023-08-22 NOTE — Progress Notes (Unsigned)
Cardiology Office Note:    Date:  09/02/2023   ID:  [name], DOB 19-Feb-1941, MRN [id number]  PCP:  Talmage Coin, MD  Cardiologist:  Haidynn Almendarez [place], MD  Electrophysiologist:  None   Referring MD: Talmage Coin, MD   Chief Complaint: follow-up of CAD  History of Present Illness:     [name] is a 82 y.o. male with a history of CAD with anterior STEMI in 2008 s/p DES to LAD, ischemic cardiomyopathy/ chronic combined CHF with EF of 45-50% on Echo in 06/2021, mild mitral regurgitation, hypertension, hyperlipidemia, type 2 diabetes , CVA at time of MI, and obesity.  Patient was admitted in 2008 for anterior STEMI after presenting after a syncopal event while driving home from work. He underwent successful PCI with stenting to LAD. He also had a CVA at the time of his MI. Myoview in 2015 showed a predominant anteroapical scare with some peri-infarct ischemia and EF of 55%. He was admitted in [facility] in 2017 for decompensated CHF and pulmonary edema. Echo at that time showed LVEF with mildly reduced EF of 45-50%, akinesis of the anterior apex, and hypokinesis of the anterior septum as well as MR and TR. He underwent repeat cardiac catheterization which showed patent LAD stent and otherwise no obstructive CAD. He was also noted to be anemic during that admission and required 2 units of PRBCs.   Patient was  seen by Bettina Gavia, PA-C, in 05/2021 at which time he was doing well with no cardiac complaints. Repeat Echo was ordered for further evaluation of murmur on exam and showed LVEF of 45-50% with small LV apical aneurysm (without any evidence of thrombus) and hypokinesis of the mid anteroseptal and apical anterior segments as well as grade 1 diastolic dysfunction, mild MR, and mild to moderate aortic sclerosis but no AS. Was last seen by Marjie Skiff PA in October 2023 and stable.   On follow up today he is doing well. Denies significant chest pain, dyspnea or palpitations. He is now on  [place]. Notes chronic LE edema that has not changed. Weight is stable at home. Activity is limited by chronic knee problems.  Past Medical History:  Diagnosis Date   CHF (congestive heart failure) (HCC)    past echo in 2010 showed an EF of 50 to 55% with mild AS and mild mitral insufficiency   Coronary artery disease 2008   Prior anterior MI (with syncope as the initial presentation) with single vessel LAD occlusive artherosclerotic CAD -- post stent of the mid LAD   CVA (cerebral vascular accident) (HCC) 2008   at the time of his MI   Diabetes mellitus    Edema    Heart murmur    Hyperlipidemia    Hypertension    Long term current use of anticoagulant    previously on coumadin therapy.    Mitral insufficiency    Myocardial infarction, old 2008   Obesity     Past Surgical History:  Procedure Laterality Date   CORONARY ANGIOPLASTY WITH STENT PLACEMENT  04/10/2007   with drug-eluting stent placement of the mid LAD   KNEE SURGERY      Current Medications: Current Meds  Medication Sig   aspirin 81 MG tablet Take 81 mg by mouth daily.   dapagliflozin propanediol (FARXIGA) 5 MG TABS tablet Take 5 mg by mouth daily. Pt stated that he takes 1 tab a day   Lutein-Zeaxanthin (OCUVITE LUTEIN 25 PO) Take 1 capsule by mouth  daily.   metFORMIN (GLUCOPHAGE) 500 MG tablet Take 500 mg by mouth 2 (two) times daily with a meal.   Multiple Vitamins-Minerals (CENTRUM SILVER PO) Take 1 tablet by mouth daily.    [DISCONTINUED] atorvastatin (LIPITOR) 80 MG tablet Take 1 tablet (80 mg total) by mouth daily.   [DISCONTINUED] carvedilol (COREG) 25 MG tablet Take 1 tablet (25 mg total) by mouth 2 (two) times daily with a meal.   [DISCONTINUED] furosemide (LASIX) 20 MG tablet Take 1 tablet (20 mg total) by mouth daily. KEEP UPCOMING APPOINTMENT FOR FUTURE REFILLS.   [DISCONTINUED] losartan (COZAAR) 100 MG tablet TAKE ONE TABLET BY MOUTH EVERY DAY   [DISCONTINUED] nitroGLYCERIN (NITROSTAT) 0.4 MG SL  tablet Place 1 tablet (0.4 mg total) under the tongue every 5 (five) minutes as needed for chest pain (x 3 doses).   [DISCONTINUED] spironolactone (ALDACTONE) 25 MG tablet Take 1/2 tablet ( 12.5 mg ) daily     Allergies:   Patient has no known allergies.   Social History   Socioeconomic History   Marital status: Widowed    Spouse name: Not on file   Number of children: Not on file   Years of education: Not on file   Highest education level: Not on file  Occupational History   Not on file  Tobacco Use   Smoking status: Former    Current packs/day: 0.00    Types: Cigarettes    Quit date: 03/30/1975    Years since quitting: 48.4   Smokeless tobacco: Never  Substance and Sexual Activity   Alcohol use: No   Drug use: No   Sexual activity: Never  Other Topics Concern   Not on file  Social History Narrative   Not on file   Social Determinants of Health   Financial Resource Strain: Not on file  Food Insecurity: Not on file  Transportation Needs: Not on file  Physical Activity: Not on file  Stress: Not on file  Social Connections: Not on file     Family History: The patient's family history includes Dementia in his mother; Heart Problems in his father. There is no history of Coronary artery disease or Congestive Heart Failure.  ROS:   Please see the history of present illness.     EKGs/Labs/Other Studies Reviewed:    The following studies were reviewed:  Echocardiogram 07/13/2021: Impressions:  1. Small left ventricular apical aneurysm, eithout thrombus (Definity  contrast). Left ventricular ejection fraction, by estimation, is 45 to  50%. The left ventricle has mildly decreased function. The left ventricle  demonstrates regional wall motion  abnormalities (see scoring diagram/findings for description). Left  ventricular diastolic parameters are consistent with Grade I diastolic  dysfunction (impaired relaxation).   2. Right ventricular systolic function is normal.  The right ventricular  size is normal. There is normal pulmonary artery systolic pressure. The  estimated right ventricular systolic pressure is 24.3 mmHg.   3. Left atrial size was mild to moderately dilated.   4. The mitral valve is normal in structure. Mild mitral valve  regurgitation.   5. The aortic valve is tricuspid. There is mild calcification of the  aortic valve. There is mild thickening of the aortic valve. Aortic valve  regurgitation is not visualized. Mild to moderate aortic valve  sclerosis/calcification is present, without any  evidence of aortic stenosis.  EKG Interpretation Date/Time:  Monday September 02 2023 13:40:43 EST Ventricular Rate:  58 PR Interval:  230 QRS Duration:  80 QT Interval:  394 QTC  Calculation: 386 R Axis:   -6  Text Interpretation: Sinus bradycardia with 1st degree A-V block with Premature atrial complexes Low voltage QRS Septal infarct (cited on or before 10-Apr-2007) Inferior infarct (cited on or before 10-Apr-2007) When compared with ECG of 11-Apr-2007 07:49, Premature atrial complexes are now Present PR interval has increased Vent. rate has decreased BY  36 BPM ST no longer elevated in Anterior leads Confirmed by [place], Liller Yohn [phone]) on 09/02/2023 1:50:40 PM     Recent Labs: No results found for requested labs within last 365 days.  Recent Lipid Panel    Component Value Date/Time   CHOL 101 03/07/2017 1025   TRIG 59 03/07/2017 1025   HDL 46 03/07/2017 1025   CHOLHDL 2.2 03/07/2017 1025   VLDL 12 03/07/2017 1025   LDLCALC 43 03/07/2017 1025   Dated 01/18/23: cholesterol 106, triglycerides 101, HDL 39, LDL 48.  Dated 07/19/23: A1c 6%. Creatinine 1.95. BUN 20. GFR 34, otherwise CMET normal    Vital Signs: BP 120/70 (BP Location: Left Arm, Patient Position: Sitting, Cuff Size: Normal)   Pulse (!) 58   Ht 5\' 5"  (1.651 m)   Wt 199 lb (90.3 kg)   SpO2 97%   BMI 33.12 kg/m     Wt Readings from Last 3 Encounters:  09/02/23 199 lb (90.3  kg)  08/23/22 204 lb 9.6 oz (92.8 kg)  06/23/21 203 lb (92.1 kg)     General: 82 y.o.  Caucasian male in no acute distress. HEENT: Normocephalic and atraumatic. Sclera clear.  Neck: Supple. No carotid bruits. No JVD. Heart: RRR. Distinct S1 and S2. Soft I-II/VI systolic murmur at the apex. No gallops or rubs. Radial pulses 2+ and equal bilaterally. Lungs: No increased work of breathing. Clear to ausculation bilaterally. No wheezes, rhonchi, or rales.  Abdomen: Soft, non-distended, and non-tender to palpation.  Extremities: 1+ pitting edema of bilateral lower extremities Skin: Warm and dry. Neuro: Alert and oriented x3. No focal deficits. Psych: Normal affect. Responds appropriately.   Assessment:    1. Coronary artery disease involving native coronary artery of native heart without angina pectoris   2. Ischemic cardiomyopathy   3. Chronic combined systolic and diastolic CHF (congestive heart failure) (HCC)   4. Mitral valve insufficiency, unspecified etiology   5. Primary hypertension   6. Hyperlipidemia, unspecified hyperlipidemia type   7. Type 2 diabetes mellitus with complication, without long-term current use of insulin (HCC)      Plan:    CAD History of anterior STEMI in 2008 s/p DES to LAD. Last cardiac catheterization in 2017 in [facility] showed patent stent with otherwise no obstructive CAD. - No chest pain.  - Continue aspirin, beta-blocker, and statin.  2. Chronic Combined CHF Ischemic Cardiomyopathy Last Echo in 06/2021 showed LVEF of 45-50% with small LV apical aneurysm (without any evidence of thrombus) and hypokinesis of the mid anteroseptal and apical anterior segments as well as grade 1 diastolic dysfunction, mild MR, and mild to moderate aortic sclerosis but no AS. - He has chronic lower extremity edema but this is stable. Weight is down 5 lbs on our scales.   - Continue Lasix 20mg  daily. - Continue Losartan 100mg  daily.  - Continue Coreg 25mg  twice daily. -  Continue Spironolactone 12.5mg  daily. - Continue daily weight and sodium restrictions. - on Farxiga 10 mg daily - apparently tried on Entresto in the past but was not covered by insurance.   3. Mild Mitral Regurgitation Noted on Echo in 06/2021. - Continue  routine serial monitoring. Will need repeat Echo in 2-4 years.  4. Hypertension BP well controlled.  - Continue medications for CHF as above.  5. Hyperlipidemia - at goal - Continue Lipitor 80mg  daily.  6. Type 2 Diabetes Mellitus - On Metformin and Farxiga. - Management per Endocrinology.  7. CKD stage 3b.  Disposition: Follow up in 6 months with APP   Medication Adjustments/Labs and Tests Ordered: Current medicines are reviewed at length with the patient today.  Concerns regarding medicines are outlined above.  Orders Placed This Encounter  Procedures   EKG 12-Lead   Meds ordered this encounter  Medications   furosemide (LASIX) 20 MG tablet    Sig: Take 1 tablet (20 mg total) by mouth daily. KEEP UPCOMING APPOINTMENT FOR FUTURE REFILLS.    Dispense:  90 tablet    Refill:  3   spironolactone (ALDACTONE) 25 MG tablet    Sig: Take 1/2 tablet ( 12.5 mg ) daily    Dispense:  45 tablet    Refill:  3   nitroGLYCERIN (NITROSTAT) 0.4 MG SL tablet    Sig: Place 1 tablet (0.4 mg total) under the tongue every 5 (five) minutes as needed for chest pain (x 3 doses).    Dispense:  25 tablet    Refill:  11   losartan (COZAAR) 100 MG tablet    Sig: Take 1 tablet (100 mg total) by mouth daily.    Dispense:  90 tablet    Refill:  3   carvedilol (COREG) 25 MG tablet    Sig: Take 1 tablet (25 mg total) by mouth 2 (two) times daily with a meal.    Dispense:  180 tablet    Refill:  3   atorvastatin (LIPITOR) 80 MG tablet    Sig: Take 1 tablet (80 mg total) by mouth daily.    Dispense:  90 tablet    Refill:  3    There are no Patient Instructions on file for this visit.   Signed, Marvion Bastidas [place], MD  09/02/2023 1:58 PM    [facility]

## 2023-08-28 ENCOUNTER — Encounter: Payer: Self-pay | Admitting: Cardiology

## 2023-09-02 ENCOUNTER — Ambulatory Visit: Payer: Medicare Other | Attending: Cardiology | Admitting: Cardiology

## 2023-09-02 ENCOUNTER — Encounter: Payer: Self-pay | Admitting: Cardiology

## 2023-09-02 VITALS — BP 120/70 | HR 58 | Ht 65.0 in | Wt 199.0 lb

## 2023-09-02 DIAGNOSIS — I5042 Chronic combined systolic (congestive) and diastolic (congestive) heart failure: Secondary | ICD-10-CM | POA: Diagnosis not present

## 2023-09-02 DIAGNOSIS — E118 Type 2 diabetes mellitus with unspecified complications: Secondary | ICD-10-CM

## 2023-09-02 DIAGNOSIS — I10 Essential (primary) hypertension: Secondary | ICD-10-CM

## 2023-09-02 DIAGNOSIS — I340 Nonrheumatic mitral (valve) insufficiency: Secondary | ICD-10-CM | POA: Diagnosis not present

## 2023-09-02 DIAGNOSIS — I2510 Atherosclerotic heart disease of native coronary artery without angina pectoris: Secondary | ICD-10-CM

## 2023-09-02 DIAGNOSIS — I255 Ischemic cardiomyopathy: Secondary | ICD-10-CM

## 2023-09-02 DIAGNOSIS — E785 Hyperlipidemia, unspecified: Secondary | ICD-10-CM

## 2023-09-02 MED ORDER — NITROGLYCERIN 0.4 MG SL SUBL: 0.4000 mg | SUBLINGUAL_TABLET | SUBLINGUAL | 11 refills | Status: DC | PRN

## 2023-09-02 MED ORDER — CARVEDILOL 25 MG PO TABS: 25.0000 mg | ORAL_TABLET | Freq: Two times a day (BID) | ORAL | 3 refills | Status: DC

## 2023-09-02 MED ORDER — FUROSEMIDE 20 MG PO TABS: 20.0000 mg | ORAL_TABLET | Freq: Every day | ORAL | 3 refills | Status: DC

## 2023-09-02 MED ORDER — SPIRONOLACTONE 25 MG PO TABS: ORAL_TABLET | ORAL | 3 refills | Status: DC

## 2023-09-02 MED ORDER — LOSARTAN POTASSIUM 100 MG PO TABS: 100.0000 mg | ORAL_TABLET | Freq: Every day | ORAL | 3 refills | Status: DC

## 2023-09-02 MED ORDER — ATORVASTATIN CALCIUM 80 MG PO TABS: 80.0000 mg | ORAL_TABLET | Freq: Every day | ORAL | 3 refills | Status: DC

## 2023-09-02 NOTE — Patient Instructions (Signed)
Medication Instructions:  No changes  Continue all other medications  *If you need a refill on your cardiac medications before your next appointment, please call your pharmacy*   Lab Work: None     Testing/Procedures: None    Follow-Up: At [facility], you and your health needs are our priority.  As part of our continuing mission to provide you with exceptional heart care, we have created designated Provider Care Teams.  These Care Teams include your primary Cardiologist (physician) and Advanced Practice Providers (APPs -  Physician Assistants and Nurse Practitioners) who all work together to provide you with the care you need, when you need it.   Your next appointment:   6 month(s) call in feb to make an appointment  for May   Provider:   Peter [place], MD

## 2024-01-16 DIAGNOSIS — Z8639 Personal history of other endocrine, nutritional and metabolic disease: Secondary | ICD-10-CM | POA: Diagnosis not present

## 2024-01-16 DIAGNOSIS — I2510 Atherosclerotic heart disease of native coronary artery without angina pectoris: Secondary | ICD-10-CM | POA: Diagnosis not present

## 2024-01-16 DIAGNOSIS — E1142 Type 2 diabetes mellitus with diabetic polyneuropathy: Secondary | ICD-10-CM | POA: Diagnosis not present

## 2024-01-16 DIAGNOSIS — E785 Hyperlipidemia, unspecified: Secondary | ICD-10-CM | POA: Diagnosis not present

## 2024-03-11 DIAGNOSIS — K080 Exfoliation of teeth due to systemic causes: Secondary | ICD-10-CM | POA: Diagnosis not present

## 2024-04-16 DIAGNOSIS — H26493 Other secondary cataract, bilateral: Secondary | ICD-10-CM | POA: Diagnosis not present

## 2024-04-16 DIAGNOSIS — E119 Type 2 diabetes mellitus without complications: Secondary | ICD-10-CM | POA: Diagnosis not present

## 2024-04-16 DIAGNOSIS — H353131 Nonexudative age-related macular degeneration, bilateral, early dry stage: Secondary | ICD-10-CM | POA: Diagnosis not present

## 2024-05-15 ENCOUNTER — Encounter: Payer: Self-pay | Admitting: Advanced Practice Midwife

## 2024-07-20 DIAGNOSIS — Z8639 Personal history of other endocrine, nutritional and metabolic disease: Secondary | ICD-10-CM | POA: Diagnosis not present

## 2024-07-20 DIAGNOSIS — E1142 Type 2 diabetes mellitus with diabetic polyneuropathy: Secondary | ICD-10-CM | POA: Diagnosis not present

## 2024-07-20 DIAGNOSIS — N1831 Chronic kidney disease, stage 3a: Secondary | ICD-10-CM | POA: Diagnosis not present

## 2024-07-20 DIAGNOSIS — R600 Localized edema: Secondary | ICD-10-CM | POA: Diagnosis not present

## 2024-07-20 DIAGNOSIS — Z23 Encounter for immunization: Secondary | ICD-10-CM | POA: Diagnosis not present

## 2024-07-20 DIAGNOSIS — I2510 Atherosclerotic heart disease of native coronary artery without angina pectoris: Secondary | ICD-10-CM | POA: Diagnosis not present

## 2024-07-20 DIAGNOSIS — E785 Hyperlipidemia, unspecified: Secondary | ICD-10-CM | POA: Diagnosis not present

## 2024-09-21 ENCOUNTER — Other Ambulatory Visit: Payer: Self-pay | Admitting: Cardiology

## 2024-10-15 DIAGNOSIS — H26492 Other secondary cataract, left eye: Secondary | ICD-10-CM | POA: Diagnosis not present

## 2024-10-21 ENCOUNTER — Other Ambulatory Visit: Payer: Self-pay | Admitting: Cardiology

## 2024-10-30 NOTE — Progress Notes (Signed)
 "  Cardiology Office Note:    Date:  11/03/2024   ID:  PADRAIG Klein, DOB 08/20/1941, MRN [id number]  PCP:  Faythe Purchase, MD  Cardiologist:  Ia Leeb, MD  Electrophysiologist:  None   Referring MD: Faythe Purchase, MD   Chief Complaint: follow-up of CAD  History of Present Illness:     [name] is a 84 y.o. male with a history of CAD with anterior STEMI in 2008 s/p DES to LAD, ischemic cardiomyopathy/ chronic combined CHF with EF of 45-50% on Echo in 06/2021, mild mitral regurgitation, hypertension, hyperlipidemia, type 2 diabetes , CVA at time of MI, and obesity.  Patient was admitted in 2008 for anterior STEMI after presenting after a syncopal event while driving home from work. He underwent successful PCI with stenting to LAD. He also had a CVA at the time of his MI. Myoview  in 2015 showed a predominant anteroapical scare with some peri-infarct ischemia and EF of 55%. He was admitted in [facility] in 2017 for decompensated CHF and pulmonary edema. Echo at that time showed LVEF with mildly reduced EF of 45-50%, akinesis of the anterior apex, and hypokinesis of the anterior septum as well as MR and TR. He underwent repeat cardiac catheterization which showed patent LAD stent and otherwise no obstructive CAD. He was also noted to be anemic during that admission and required 2 units of PRBCs.   Patient was  seen by Mercy Hails, PA-C, in 05/2021 at which time he was doing well with no cardiac complaints. Repeat Echo was ordered for further evaluation of murmur on exam and showed LVEF of 45-50% with small LV apical aneurysm (without any evidence of thrombus) and hypokinesis of the mid anteroseptal and apical anterior segments as well as grade 1 diastolic dysfunction, mild MR, and mild to moderate aortic sclerosis but no AS. Was last seen by Aline Door PA in October 2023 and stable.   On follow up today he is doing well. He states in September he was having more swelling. He eliminated salty  snacks and lost 30 lbs with improvement in swelling. Notes Dr Faythe stopped metformin. Notes lack of energy and BP has been running low.   Past Medical History:  Diagnosis Date   CHF (congestive heart failure) (HCC)    past echo in 2010 showed an EF of 50 to 55% with mild AS and mild mitral insufficiency   Coronary artery disease 2008   Prior anterior MI (with syncope as the initial presentation) with single vessel LAD occlusive artherosclerotic CAD -- post stent of the mid LAD   CVA (cerebral vascular accident) (HCC) 2008   at the time of his MI   Diabetes mellitus    Edema    Heart murmur    Hyperlipidemia    Hypertension    Long term current use of anticoagulant    previously on coumadin therapy.    Mitral insufficiency    Myocardial infarction, old 2008   Obesity     Past Surgical History:  Procedure Laterality Date   CORONARY ANGIOPLASTY WITH STENT PLACEMENT  04/10/2007   with drug-eluting stent placement of the mid LAD   KNEE SURGERY      Current Medications: Current Meds  Medication Sig   aspirin 81 MG tablet Take 81 mg by mouth daily.   carvedilol  (COREG ) 12.5 MG tablet Take 1 tablet (12.5 mg total) by mouth 2 (two) times daily.   dapagliflozin propanediol (FARXIGA) 5 MG TABS tablet Take 5  mg by mouth daily. Pt stated that he takes 1 tab a day   Lutein-Zeaxanthin (OCUVITE LUTEIN 25 PO) Take 1 capsule by mouth daily.   Multiple Vitamins-Minerals (CENTRUM SILVER PO) Take 1 tablet by mouth daily.    [DISCONTINUED] atorvastatin  (LIPITOR) 80 MG tablet Take 1 tablet (80 mg total) by mouth daily.   [DISCONTINUED] carvedilol  (COREG ) 25 MG tablet Take 1 tablet (25 mg total) by mouth 2 (two) times daily with a meal.   [DISCONTINUED] furosemide  (LASIX ) 20 MG tablet Take 1 tablet (20 mg total) by mouth daily.   [DISCONTINUED] losartan  (COZAAR ) 100 MG tablet Take 1 tablet (100 mg total) by mouth daily.   [DISCONTINUED] metFORMIN (GLUCOPHAGE) 500 MG tablet Take 500 mg by mouth 2  (two) times daily with a meal.   [DISCONTINUED] nitroGLYCERIN  (NITROSTAT ) 0.4 MG SL tablet Place 1 tablet (0.4 mg total) under the tongue every 5 (five) minutes as needed for chest pain (x 3 doses).   [DISCONTINUED] spironolactone  (ALDACTONE ) 25 MG tablet Take 1/2 tablet by mouth (12.5 mg) daily     Allergies:   Patient has no known allergies.   Social History   Socioeconomic History   Marital status: Widowed    Spouse name: Not on file   Number of children: Not on file   Years of education: Not on file   Highest education level: Not on file  Occupational History   Not on file  Tobacco Use   Smoking status: Former    Current packs/day: 0.00    Types: Cigarettes    Quit date: 03/30/1975    Years since quitting: 49.6   Smokeless tobacco: Never  Substance and Sexual Activity   Alcohol use: No   Drug use: No   Sexual activity: Never  Other Topics Concern   Not on file  Social History Narrative   Not on file   Social Drivers of Health   Tobacco Use: Medium Risk (11/03/2024)   Patient History    Smoking Tobacco Use: Former    Smokeless Tobacco Use: Never    Passive Exposure: Not on Actuary Strain: Not on file  Food Insecurity: Not on file  Transportation Needs: Not on file  Physical Activity: Not on file  Stress: Not on file  Social Connections: Not on file  Depression (EYV7-0): Not on file  Alcohol Screen: Not on file  Housing: Not on file  Utilities: Not on file  Health Literacy: Not on file     Family History: The patient's family history includes Dementia in his mother; Heart Problems in his father. There is no history of Coronary artery disease or Congestive Heart Failure.  ROS:   Please see the history of present illness.     EKGs/Labs/Other Studies Reviewed:    The following studies were reviewed:  Echocardiogram 07/13/2021: Impressions:  1. Small left ventricular apical aneurysm, eithout thrombus (Definity   contrast). Left ventricular  ejection fraction, by estimation, is 45 to  50%. The left ventricle has mildly decreased function. The left ventricle  demonstrates regional wall motion  abnormalities (see scoring diagram/findings for description). Left  ventricular diastolic parameters are consistent with Grade I diastolic  dysfunction (impaired relaxation).   2. Right ventricular systolic function is normal. The right ventricular  size is normal. There is normal pulmonary artery systolic pressure. The  estimated right ventricular systolic pressure is 24.3 mmHg.   3. Left atrial size was mild to moderately dilated.   4. The mitral valve is normal in  structure. Mild mitral valve  regurgitation.   5. The aortic valve is tricuspid. There is mild calcification of the  aortic valve. There is mild thickening of the aortic valve. Aortic valve  regurgitation is not visualized. Mild to moderate aortic valve  sclerosis/calcification is present, without any  evidence of aortic stenosis.  EKG Interpretation Date/Time:  Tuesday November 03 2024 15:15:31 EST Ventricular Rate:  88 PR Interval:  212 QRS Duration:  70 QT Interval:  330 QTC Calculation: 399 R Axis:   -13  Text Interpretation: Sinus rhythm with 1st degree A-V block Possible Inferior infarct (cited on or before 10-Apr-2007) When compared with ECG of 02-Sep-2023 13:40, Premature atrial complexes are no longer Present Vent. rate has increased BY  30 BPM Criteria for Septal infarct are no longer Present Confirmed by Quintina Hakeem [phone]) on 11/03/2024 3:25:43 PM     Recent Labs: No results found for requested labs within last 365 days.  Recent Lipid Panel    Component Value Date/Time   CHOL 101 03/07/2017 1025   TRIG 59 03/07/2017 1025   HDL 46 03/07/2017 1025   CHOLHDL 2.2 03/07/2017 1025   VLDL 12 03/07/2017 1025   LDLCALC 43 03/07/2017 1025   Dated 01/18/23: cholesterol 106, triglycerides 101, HDL 39, LDL 48.  Dated 07/19/23: A1c 6%. Creatinine 1.95. BUN 20. GFR  34, otherwise CMET normal    EKG Interpretation Date/Time:  Tuesday November 03 2024 15:15:31 EST Ventricular Rate:  88 PR Interval:  212 QRS Duration:  70 QT Interval:  330 QTC Calculation: 399 R Axis:   -13  Text Interpretation: Sinus rhythm with 1st degree A-V block Possible Inferior infarct (cited on or before 10-Apr-2007) When compared with ECG of 02-Sep-2023 13:40, Premature atrial complexes are no longer Present Vent. rate has increased BY  30 BPM Criteria for Septal infarct are no longer Present Confirmed by Karol Skarzynski [phone]) on 11/03/2024 3:25:43 PM    Vital Signs: BP 102/64   Pulse 88   Ht 5' 5 (1.651 m)   Wt 170 lb 12.8 oz (77.5 kg)   SpO2 98%   BMI 28.42 kg/m     Wt Readings from Last 3 Encounters:  11/03/24 170 lb 12.8 oz (77.5 kg)  09/02/23 199 lb (90.3 kg)  08/23/22 204 lb 9.6 oz (92.8 kg)     General: 84 y.o.  Caucasian male in no acute distress. HEENT: Normocephalic and atraumatic. Sclera clear.  Neck: Supple. No carotid bruits. No JVD. Heart: RRR. Distinct S1 and S2. Soft I-II/VI systolic murmur at the apex. No gallops or rubs. Radial pulses 2+ and equal bilaterally. Lungs: No increased work of breathing. Clear to ausculation bilaterally. No wheezes, rhonchi, or rales.  Abdomen: Soft, non-distended, and non-tender to palpation.  Extremities: 1+ pitting edema of bilateral lower extremities Skin: Warm and dry. Neuro: Alert and oriented x3. No focal deficits. Psych: Normal affect. Responds appropriately.   Assessment:    1. Coronary artery disease involving native coronary artery of native heart without angina pectoris   2. Ischemic cardiomyopathy   3. Chronic combined systolic and diastolic CHF (congestive heart failure) (HCC)   4. Mitral valve insufficiency, unspecified etiology   5. Primary hypertension   6. Hyperlipidemia, unspecified hyperlipidemia type       Plan:    CAD History of anterior STEMI in 2008 s/p DES to LAD. Last cardiac  catheterization in 2017 in [facility] showed patent stent with otherwise no obstructive CAD. - No chest pain.  - Continue aspirin,  beta-blocker, and statin.  2. Chronic Combined CHF Ischemic Cardiomyopathy Last Echo in 06/2021 showed LVEF of 45-50% with small LV apical aneurysm (without any evidence of thrombus) and hypokinesis of the mid anteroseptal and apical anterior segments as well as grade 1 diastolic dysfunction, mild MR, and mild to moderate aortic sclerosis but no AS. - He has lost 30 lbs with dietary modification.  - Continue Lasix  20mg  daily. - Continue Losartan  100mg  daily.  - reduce Coreg  12.5mg  twice daily due to low BP - Continue Spironolactone  12.5mg  daily. - Continue daily weight and sodium restrictions. - on Farxiga 10 mg daily - apparently tried on Entresto  in the past but was not covered by insurance.   3. Mild Mitral Regurgitation Noted on Echo in 06/2021. - Continue routine serial monitoring. Will need repeat Echo in 2-4 years.  4. Hypertension BP well controlled.  - Continue medications for CHF as above.  5. Hyperlipidemia - at goal - Continue Lipitor 80mg  daily.  6. Type 2 Diabetes Mellitus - On  Farxiga. - Management per Endocrinology.  7. CKD stage 3b.  Disposition: Follow up in one year   Medication Adjustments/Labs and Tests Ordered: Current medicines are reviewed at length with the patient today.  Concerns regarding medicines are outlined above.  Orders Placed This Encounter  Procedures   EKG 12-Lead   Meds ordered this encounter  Medications   carvedilol  (COREG ) 12.5 MG tablet    Sig: Take 1 tablet (12.5 mg total) by mouth 2 (two) times daily.    Dispense:  180 tablet    Refill:  3   atorvastatin  (LIPITOR) 80 MG tablet    Sig: Take 1 tablet (80 mg total) by mouth daily.    Dispense:  90 tablet    Refill:  3    Pt must schedule an overdue followup appt with Cardiology for any more refills. [phone] 1st attempt Thank You   losartan  (COZAAR )  100 MG tablet    Sig: Take 1 tablet (100 mg total) by mouth daily.    Dispense:  90 tablet    Refill:  3    Pt must schedule an overdue followup appt with Cardiology for any more refills. [phone] 1st attempt Thank You   furosemide  (LASIX ) 20 MG tablet    Sig: Take 1 tablet (20 mg total) by mouth daily.    Dispense:  90 tablet    Refill:  3    Pt must schedule an overdue followup appt with Cardiology for any more refills. [phone] 1st attempt Thank You   nitroGLYCERIN  (NITROSTAT ) 0.4 MG SL tablet    Sig: Place 1 tablet (0.4 mg total) under the tongue every 5 (five) minutes as needed for chest pain (x 3 doses).    Dispense:  25 tablet    Refill:  11   spironolactone  (ALDACTONE ) 25 MG tablet    Sig: Take 1/2 tablet by mouth (12.5 mg) daily    Dispense:  45 tablet    Refill:  3    Pt must schedule an overdue followup appt with Cardiology for any more refills. [phone] 1st attempt Thank You    Patient Instructions  Medication Instructions:  Continue same medications *If you need a refill on your cardiac medications before your next appointment, please call your pharmacy*  Lab Work:   Testing/Procedures:   Follow-Up: At [facility], you and your health needs are our priority.  As part of our continuing mission to provide you with exceptional heart care, our providers  are all part of one team.  This team includes your primary Cardiologist (physician) and Advanced Practice Providers or APPs (Physician Assistants and Nurse Practitioners) who all work together to provide you with the care you need, when you need it.  Your next appointment:      Provider:     We recommend signing up for the patient portal called MyChart.  Sign up information is provided on this After Visit Summary.  MyChart is used to connect with patients for Virtual Visits (Telemedicine).  Patients are able to view lab/test results, encounter notes, upcoming appointments, etc.  Non-urgent  messages can be sent to your provider as well.   To learn more about what you can do with MyChart, go to [email or web address].      Signed, Raylyn Carton, MD  11/03/2024 3:32 PM    [facility] "

## 2024-11-02 ENCOUNTER — Other Ambulatory Visit: Payer: Self-pay | Admitting: Cardiology

## 2024-11-03 ENCOUNTER — Ambulatory Visit: Admitting: Cardiology

## 2024-11-03 ENCOUNTER — Encounter: Payer: Self-pay | Admitting: Cardiology

## 2024-11-03 VITALS — BP 102/64 | HR 88 | Ht 65.0 in | Wt 170.8 lb

## 2024-11-03 DIAGNOSIS — I5042 Chronic combined systolic (congestive) and diastolic (congestive) heart failure: Secondary | ICD-10-CM

## 2024-11-03 DIAGNOSIS — E785 Hyperlipidemia, unspecified: Secondary | ICD-10-CM | POA: Diagnosis not present

## 2024-11-03 DIAGNOSIS — I10 Essential (primary) hypertension: Secondary | ICD-10-CM | POA: Diagnosis not present

## 2024-11-03 DIAGNOSIS — I2510 Atherosclerotic heart disease of native coronary artery without angina pectoris: Secondary | ICD-10-CM | POA: Diagnosis not present

## 2024-11-03 DIAGNOSIS — I340 Nonrheumatic mitral (valve) insufficiency: Secondary | ICD-10-CM | POA: Diagnosis not present

## 2024-11-03 DIAGNOSIS — I255 Ischemic cardiomyopathy: Secondary | ICD-10-CM

## 2024-11-03 MED ORDER — SPIRONOLACTONE 25 MG PO TABS: ORAL_TABLET | ORAL | 3 refills | Status: DC

## 2024-11-03 MED ORDER — FUROSEMIDE 20 MG PO TABS: 20.0000 mg | ORAL_TABLET | Freq: Every day | ORAL | 3 refills | Status: DC

## 2024-11-03 MED ORDER — CARVEDILOL 12.5 MG PO TABS: 12.5000 mg | ORAL_TABLET | Freq: Two times a day (BID) | ORAL | 3 refills | Status: DC

## 2024-11-03 MED ORDER — LOSARTAN POTASSIUM 100 MG PO TABS: 100.0000 mg | ORAL_TABLET | Freq: Every day | ORAL | 3 refills | Status: DC

## 2024-11-03 MED ORDER — NITROGLYCERIN 0.4 MG SL SUBL: 0.4000 mg | SUBLINGUAL_TABLET | SUBLINGUAL | 11 refills | Status: AC | PRN

## 2024-11-03 MED ORDER — ATORVASTATIN CALCIUM 80 MG PO TABS: 80.0000 mg | ORAL_TABLET | Freq: Every day | ORAL | 3 refills | Status: AC

## 2024-11-03 NOTE — Patient Instructions (Addendum)
 Medication Instructions: Decrease Carvedilol  to 12.5 mg twice a day Continue all other medications *If you need a refill on your cardiac medications before your next appointment, please call your pharmacy*  Lab Work: None ordered  Testing/Procedures: None orderd  Follow-Up: At [facility], you and your health needs are our priority.  As part of our continuing mission to provide you with exceptional heart care, our providers are all part of one team.  This team includes your primary Cardiologist (physician) and Advanced Practice Providers or APPs (Physician Assistants and Nurse Practitioners) who all work together to provide you with the care you need, when you need it.  Your next appointment:  1 year    Call in Oct to schedule Jan appointment     Provider:  Dr.Jordan   We recommend signing up for the patient portal called MyChart.  Sign up information is provided on this After Visit Summary.  MyChart is used to connect with patients for Virtual Visits (Telemedicine).  Patients are able to view lab/test results, encounter notes, upcoming appointments, etc.  Non-urgent messages can be sent to your provider as well.   To learn more about what you can do with MyChart, go to [email or web address].

## 2024-11-11 ENCOUNTER — Emergency Department (HOSPITAL_COMMUNITY)

## 2024-11-11 ENCOUNTER — Other Ambulatory Visit: Payer: Self-pay

## 2024-11-11 ENCOUNTER — Encounter: Payer: Self-pay | Admitting: Cardiology

## 2024-11-11 ENCOUNTER — Inpatient Hospital Stay (HOSPITAL_COMMUNITY)
Admission: EM | Admit: 2024-11-11 | Discharge: 2024-12-02 | DRG: 871 | Disposition: A | Discharge status: Skilled Nursing Facility | Attending: Internal Medicine | Admitting: Internal Medicine

## 2024-11-11 ENCOUNTER — Encounter (HOSPITAL_COMMUNITY): Payer: Self-pay

## 2024-11-11 ENCOUNTER — Inpatient Hospital Stay (HOSPITAL_COMMUNITY)

## 2024-11-11 DIAGNOSIS — L89152 Pressure ulcer of sacral region, stage 2: Secondary | ICD-10-CM | POA: Diagnosis present

## 2024-11-11 DIAGNOSIS — I214 Non-ST elevation (NSTEMI) myocardial infarction: Secondary | ICD-10-CM

## 2024-11-11 DIAGNOSIS — L8990 Pressure ulcer of unspecified site, unspecified stage: Secondary | ICD-10-CM | POA: Insufficient documentation

## 2024-11-11 DIAGNOSIS — Z66 Do not resuscitate: Secondary | ICD-10-CM | POA: Diagnosis present

## 2024-11-11 DIAGNOSIS — R579 Shock, unspecified: Secondary | ICD-10-CM

## 2024-11-11 DIAGNOSIS — I959 Hypotension, unspecified: Secondary | ICD-10-CM

## 2024-11-11 DIAGNOSIS — I5020 Unspecified systolic (congestive) heart failure: Secondary | ICD-10-CM

## 2024-11-11 DIAGNOSIS — N401 Enlarged prostate with lower urinary tract symptoms: Secondary | ICD-10-CM | POA: Diagnosis present

## 2024-11-11 DIAGNOSIS — N19 Unspecified kidney failure: Secondary | ICD-10-CM | POA: Diagnosis present

## 2024-11-11 DIAGNOSIS — E876 Hypokalemia: Secondary | ICD-10-CM | POA: Diagnosis present

## 2024-11-11 DIAGNOSIS — N179 Acute kidney failure, unspecified: Principal | ICD-10-CM | POA: Diagnosis present

## 2024-11-11 DIAGNOSIS — Z8673 Personal history of transient ischemic attack (TIA), and cerebral infarction without residual deficits: Secondary | ICD-10-CM

## 2024-11-11 DIAGNOSIS — K219 Gastro-esophageal reflux disease without esophagitis: Secondary | ICD-10-CM | POA: Diagnosis present

## 2024-11-11 DIAGNOSIS — Z955 Presence of coronary angioplasty implant and graft: Secondary | ICD-10-CM

## 2024-11-11 DIAGNOSIS — I2510 Atherosclerotic heart disease of native coronary artery without angina pectoris: Secondary | ICD-10-CM | POA: Diagnosis present

## 2024-11-11 DIAGNOSIS — N138 Other obstructive and reflux uropathy: Secondary | ICD-10-CM | POA: Diagnosis present

## 2024-11-11 DIAGNOSIS — R578 Other shock: Secondary | ICD-10-CM | POA: Diagnosis not present

## 2024-11-11 DIAGNOSIS — N320 Bladder-neck obstruction: Secondary | ICD-10-CM | POA: Diagnosis present

## 2024-11-11 DIAGNOSIS — E1122 Type 2 diabetes mellitus with diabetic chronic kidney disease: Secondary | ICD-10-CM | POA: Diagnosis present

## 2024-11-11 DIAGNOSIS — R64 Cachexia: Secondary | ICD-10-CM | POA: Diagnosis present

## 2024-11-11 DIAGNOSIS — E8809 Other disorders of plasma-protein metabolism, not elsewhere classified: Secondary | ICD-10-CM | POA: Diagnosis present

## 2024-11-11 DIAGNOSIS — I130 Hypertensive heart and chronic kidney disease with heart failure and stage 1 through stage 4 chronic kidney disease, or unspecified chronic kidney disease: Secondary | ICD-10-CM | POA: Diagnosis present

## 2024-11-11 DIAGNOSIS — D539 Nutritional anemia, unspecified: Secondary | ICD-10-CM | POA: Diagnosis not present

## 2024-11-11 DIAGNOSIS — E440 Moderate protein-calorie malnutrition: Secondary | ICD-10-CM | POA: Diagnosis present

## 2024-11-11 DIAGNOSIS — L89106 Pressure-induced deep tissue damage of unspecified part of back: Secondary | ICD-10-CM | POA: Diagnosis present

## 2024-11-11 DIAGNOSIS — K567 Ileus, unspecified: Secondary | ICD-10-CM | POA: Diagnosis not present

## 2024-11-11 DIAGNOSIS — N390 Urinary tract infection, site not specified: Secondary | ICD-10-CM

## 2024-11-11 DIAGNOSIS — R571 Hypovolemic shock: Secondary | ICD-10-CM | POA: Diagnosis present

## 2024-11-11 DIAGNOSIS — Z794 Long term (current) use of insulin: Secondary | ICD-10-CM

## 2024-11-11 DIAGNOSIS — E1165 Type 2 diabetes mellitus with hyperglycemia: Secondary | ICD-10-CM | POA: Diagnosis present

## 2024-11-11 DIAGNOSIS — Z7901 Long term (current) use of anticoagulants: Secondary | ICD-10-CM

## 2024-11-11 DIAGNOSIS — Z7982 Long term (current) use of aspirin: Secondary | ICD-10-CM

## 2024-11-11 DIAGNOSIS — E66811 Obesity, class 1: Secondary | ICD-10-CM | POA: Diagnosis present

## 2024-11-11 DIAGNOSIS — N184 Chronic kidney disease, stage 4 (severe): Secondary | ICD-10-CM | POA: Diagnosis present

## 2024-11-11 DIAGNOSIS — I5042 Chronic combined systolic (congestive) and diastolic (congestive) heart failure: Secondary | ICD-10-CM | POA: Diagnosis present

## 2024-11-11 DIAGNOSIS — Z1152 Encounter for screening for COVID-19: Secondary | ICD-10-CM

## 2024-11-11 DIAGNOSIS — Z87891 Personal history of nicotine dependence: Secondary | ICD-10-CM

## 2024-11-11 DIAGNOSIS — E785 Hyperlipidemia, unspecified: Secondary | ICD-10-CM | POA: Diagnosis present

## 2024-11-11 DIAGNOSIS — R627 Adult failure to thrive: Secondary | ICD-10-CM | POA: Diagnosis present

## 2024-11-11 DIAGNOSIS — Y846 Urinary catheterization as the cause of abnormal reaction of the patient, or of later complication, without mention of misadventure at the time of the procedure: Secondary | ICD-10-CM | POA: Diagnosis not present

## 2024-11-11 DIAGNOSIS — I440 Atrioventricular block, first degree: Secondary | ICD-10-CM | POA: Diagnosis present

## 2024-11-11 DIAGNOSIS — N136 Pyonephrosis: Secondary | ICD-10-CM | POA: Diagnosis present

## 2024-11-11 DIAGNOSIS — D631 Anemia in chronic kidney disease: Secondary | ICD-10-CM | POA: Diagnosis present

## 2024-11-11 DIAGNOSIS — Z6831 Body mass index (BMI) 31.0-31.9, adult: Secondary | ICD-10-CM

## 2024-11-11 DIAGNOSIS — I4891 Unspecified atrial fibrillation: Secondary | ICD-10-CM | POA: Diagnosis not present

## 2024-11-11 DIAGNOSIS — I252 Old myocardial infarction: Secondary | ICD-10-CM

## 2024-11-11 DIAGNOSIS — Z79899 Other long term (current) drug therapy: Secondary | ICD-10-CM

## 2024-11-11 DIAGNOSIS — A419 Sepsis, unspecified organism: Principal | ICD-10-CM | POA: Diagnosis present

## 2024-11-11 DIAGNOSIS — T8383XA Hemorrhage of genitourinary prosthetic devices, implants and grafts, initial encounter: Secondary | ICD-10-CM | POA: Diagnosis not present

## 2024-11-11 DIAGNOSIS — I255 Ischemic cardiomyopathy: Secondary | ICD-10-CM | POA: Diagnosis present

## 2024-11-11 DIAGNOSIS — R310 Gross hematuria: Secondary | ICD-10-CM | POA: Diagnosis not present

## 2024-11-11 DIAGNOSIS — Z515 Encounter for palliative care: Secondary | ICD-10-CM

## 2024-11-11 DIAGNOSIS — D62 Acute posthemorrhagic anemia: Secondary | ICD-10-CM | POA: Diagnosis not present

## 2024-11-11 DIAGNOSIS — E872 Acidosis, unspecified: Secondary | ICD-10-CM | POA: Diagnosis present

## 2024-11-11 DIAGNOSIS — E875 Hyperkalemia: Secondary | ICD-10-CM | POA: Diagnosis present

## 2024-11-11 DIAGNOSIS — R338 Other retention of urine: Secondary | ICD-10-CM | POA: Diagnosis present

## 2024-11-11 DIAGNOSIS — R6521 Severe sepsis with septic shock: Secondary | ICD-10-CM | POA: Diagnosis present

## 2024-11-11 DIAGNOSIS — R339 Retention of urine, unspecified: Secondary | ICD-10-CM

## 2024-11-11 DIAGNOSIS — E861 Hypovolemia: Secondary | ICD-10-CM | POA: Diagnosis present

## 2024-11-11 LAB — CBC
HCT: 23.5 % — ABNORMAL LOW (ref 39.0–52.0)
Hemoglobin: 7.7 g/dL — ABNORMAL LOW (ref 13.0–17.0)
MCH: 32.1 pg (ref 26.0–34.0)
MCHC: 32.8 g/dL (ref 30.0–36.0)
MCV: 97.9 fL (ref 80.0–100.0)
Platelets: 232 K/uL (ref 150–400)
RBC: 2.4 MIL/uL — ABNORMAL LOW (ref 4.22–5.81)
RDW: 16.5 % — ABNORMAL HIGH (ref 11.5–15.5)
WBC: 21.8 K/uL — ABNORMAL HIGH (ref 4.0–10.5)
nRBC: 0 % (ref 0.0–0.2)

## 2024-11-11 LAB — COMPREHENSIVE METABOLIC PANEL WITH GFR
ALT: 20 U/L (ref 0–44)
ALT: 18 U/L (ref 0–44)
AST: 19 U/L (ref 15–41)
AST: 19 U/L (ref 15–41)
Albumin: 2.5 g/dL — ABNORMAL LOW (ref 3.5–5.0)
Albumin: 2.2 g/dL — ABNORMAL LOW (ref 3.5–5.0)
Alkaline Phosphatase: 78 U/L (ref 38–126)
Alkaline Phosphatase: 62 U/L (ref 38–126)
Anion gap: 21 — ABNORMAL HIGH (ref 5–15)
Anion gap: 17 — ABNORMAL HIGH (ref 5–15)
BUN: 194 mg/dL — ABNORMAL HIGH (ref 8–23)
BUN: 170 mg/dL — ABNORMAL HIGH (ref 8–23)
CO2: 10 mmol/L — ABNORMAL LOW (ref 22–32)
CO2: 21 mmol/L — ABNORMAL LOW (ref 22–32)
Calcium: 8.5 mg/dL — ABNORMAL LOW (ref 8.9–10.3)
Calcium: 7.1 mg/dL — ABNORMAL LOW (ref 8.9–10.3)
Chloride: 103 mmol/L (ref 98–111)
Chloride: 96 mmol/L — ABNORMAL LOW (ref 98–111)
Creatinine, Ser: 11.6 mg/dL — ABNORMAL HIGH (ref 0.61–1.24)
Creatinine, Ser: 9.82 mg/dL — ABNORMAL HIGH (ref 0.61–1.24)
GFR, Estimated: 4 mL/min — ABNORMAL LOW
GFR, Estimated: 5 mL/min — ABNORMAL LOW
Glucose, Bld: 179 mg/dL — ABNORMAL HIGH (ref 70–99)
Glucose, Bld: 447 mg/dL — ABNORMAL HIGH (ref 70–99)
Potassium: 6 mmol/L — ABNORMAL HIGH (ref 3.5–5.1)
Potassium: 5.4 mmol/L — ABNORMAL HIGH (ref 3.5–5.1)
Sodium: 134 mmol/L — ABNORMAL LOW (ref 135–145)
Sodium: 134 mmol/L — ABNORMAL LOW (ref 135–145)
Total Bilirubin: 0.3 mg/dL (ref 0.0–1.2)
Total Bilirubin: 0.2 mg/dL (ref 0.0–1.2)
Total Protein: 5.7 g/dL — ABNORMAL LOW (ref 6.5–8.1)
Total Protein: 4.9 g/dL — ABNORMAL LOW (ref 6.5–8.1)

## 2024-11-11 LAB — I-STAT VENOUS BLOOD GAS, ED
Acid-base deficit: 18 mmol/L — ABNORMAL HIGH (ref 0.0–2.0)
Bicarbonate: 8.7 mmol/L — ABNORMAL LOW (ref 20.0–28.0)
Calcium, Ion: 1.19 mmol/L (ref 1.15–1.40)
HCT: 23 % — ABNORMAL LOW (ref 39.0–52.0)
Hemoglobin: 7.8 g/dL — ABNORMAL LOW (ref 13.0–17.0)
O2 Saturation: 77 %
Potassium: 5.9 mmol/L — ABNORMAL HIGH (ref 3.5–5.1)
Sodium: 130 mmol/L — ABNORMAL LOW (ref 135–145)
TCO2: 9 mmol/L — ABNORMAL LOW (ref 22–32)
pCO2, Ven: 23.5 mmHg — ABNORMAL LOW (ref 44–60)
pH, Ven: 7.178 — CL (ref 7.25–7.43)
pO2, Ven: 51 mmHg — ABNORMAL HIGH (ref 32–45)

## 2024-11-11 LAB — CBC WITH DIFFERENTIAL/PLATELET
Abs Immature Granulocytes: 0.18 K/uL — ABNORMAL HIGH (ref 0.00–0.07)
Basophils Absolute: 0 K/uL (ref 0.0–0.1)
Basophils Relative: 0 %
Eosinophils Absolute: 0 K/uL (ref 0.0–0.5)
Eosinophils Relative: 0 %
HCT: 28.7 % — ABNORMAL LOW (ref 39.0–52.0)
Hemoglobin: 9.2 g/dL — ABNORMAL LOW (ref 13.0–17.0)
Immature Granulocytes: 1 %
Lymphocytes Relative: 2 %
Lymphs Abs: 0.5 K/uL — ABNORMAL LOW (ref 0.7–4.0)
MCH: 32.7 pg (ref 26.0–34.0)
MCHC: 32.1 g/dL (ref 30.0–36.0)
MCV: 102.1 fL — ABNORMAL HIGH (ref 80.0–100.0)
Monocytes Absolute: 1.1 K/uL — ABNORMAL HIGH (ref 0.1–1.0)
Monocytes Relative: 5 %
Neutro Abs: 18.9 K/uL — ABNORMAL HIGH (ref 1.7–7.7)
Neutrophils Relative %: 92 %
Platelets: 276 K/uL (ref 150–400)
RBC: 2.81 MIL/uL — ABNORMAL LOW (ref 4.22–5.81)
RDW: 16.5 % — ABNORMAL HIGH (ref 11.5–15.5)
WBC: 20.7 K/uL — ABNORMAL HIGH (ref 4.0–10.5)
nRBC: 0 % (ref 0.0–0.2)

## 2024-11-11 LAB — TROPONIN T, HIGH SENSITIVITY
Troponin T High Sensitivity: 71 ng/L — ABNORMAL HIGH (ref 0–19)
Troponin T High Sensitivity: 60 ng/L — ABNORMAL HIGH (ref 0–19)

## 2024-11-11 LAB — BASIC METABOLIC PANEL WITH GFR
Anion gap: 16 — ABNORMAL HIGH (ref 5–15)
Anion gap: 23 — ABNORMAL HIGH (ref 5–15)
BUN: 163 mg/dL — ABNORMAL HIGH (ref 8–23)
BUN: 182 mg/dL — ABNORMAL HIGH (ref 8–23)
CO2: 24 mmol/L (ref 22–32)
CO2: 10 mmol/L — ABNORMAL LOW (ref 22–32)
Calcium: 6.8 mg/dL — ABNORMAL LOW (ref 8.9–10.3)
Calcium: 8 mg/dL — ABNORMAL LOW (ref 8.9–10.3)
Chloride: 95 mmol/L — ABNORMAL LOW (ref 98–111)
Chloride: 101 mmol/L (ref 98–111)
Creatinine, Ser: 9.37 mg/dL — ABNORMAL HIGH (ref 0.61–1.24)
Creatinine, Ser: 10.8 mg/dL — ABNORMAL HIGH (ref 0.61–1.24)
GFR, Estimated: 5 mL/min — ABNORMAL LOW
GFR, Estimated: 4 mL/min — ABNORMAL LOW
Glucose, Bld: 568 mg/dL (ref 70–99)
Glucose, Bld: 211 mg/dL — ABNORMAL HIGH (ref 70–99)
Potassium: 5.2 mmol/L — ABNORMAL HIGH (ref 3.5–5.1)
Potassium: 5.8 mmol/L — ABNORMAL HIGH (ref 3.5–5.1)
Sodium: 135 mmol/L (ref 135–145)
Sodium: 134 mmol/L — ABNORMAL LOW (ref 135–145)

## 2024-11-11 LAB — URINALYSIS, ROUTINE W REFLEX MICROSCOPIC
Bilirubin Urine: NEGATIVE
Glucose, UA: NEGATIVE mg/dL
Ketones, ur: NEGATIVE mg/dL
Nitrite: NEGATIVE
Protein, ur: >=300 mg/dL — AB
RBC / HPF: >50 RBC/hpf (ref 0–5)
Specific Gravity, Urine: 1.01 (ref 1.005–1.030)
WBC, UA: >50 WBC/hpf (ref 0–5)
pH: 5 (ref 5.0–8.0)

## 2024-11-11 LAB — IRON AND TIBC
Iron: 102 ug/dL (ref 45–182)
Saturation Ratios: 84 % — ABNORMAL HIGH (ref 17.9–39.5)
TIBC: 121 ug/dL — ABNORMAL LOW (ref 250–450)
UIBC: 19 ug/dL

## 2024-11-11 LAB — GLUCOSE, CAPILLARY: Glucose-Capillary: 189 mg/dL — ABNORMAL HIGH (ref 70–99)

## 2024-11-11 LAB — I-STAT CHEM 8, ED
BUN: >130 mg/dL — ABNORMAL HIGH (ref 8–23)
Calcium, Ion: 1.1 mmol/L — ABNORMAL LOW (ref 1.15–1.40)
Chloride: 106 mmol/L (ref 98–111)
Creatinine, Ser: 14 mg/dL — ABNORMAL HIGH (ref 0.61–1.24)
Glucose, Bld: 181 mg/dL — ABNORMAL HIGH (ref 70–99)
HCT: 26 % — ABNORMAL LOW (ref 39.0–52.0)
Hemoglobin: 8.8 g/dL — ABNORMAL LOW (ref 13.0–17.0)
Potassium: 7 mmol/L (ref 3.5–5.1)
Sodium: 128 mmol/L — ABNORMAL LOW (ref 135–145)
TCO2: 11 mmol/L — ABNORMAL LOW (ref 22–32)

## 2024-11-11 LAB — RETICULOCYTES
Immature Retic Fract: 15.7 % (ref 2.3–15.9)
RBC.: 2.01 MIL/uL — ABNORMAL LOW (ref 4.22–5.81)
Retic Count, Absolute: 56.1 K/uL (ref 19.0–186.0)
Retic Ct Pct: 2.8 % (ref 0.4–3.1)

## 2024-11-11 LAB — I-STAT CG4 LACTIC ACID, ED
Lactic Acid, Venous: 1.4 mmol/L (ref 0.5–1.9)
Lactic Acid, Venous: 1.2 mmol/L (ref 0.5–1.9)

## 2024-11-11 LAB — MRSA NEXT GEN BY PCR, NASAL: MRSA by PCR Next Gen: NOT DETECTED

## 2024-11-11 LAB — RESP PANEL BY RT-PCR (RSV, FLU A&B, COVID)  RVPGX2
Influenza A by PCR: NEGATIVE
Influenza B by PCR: NEGATIVE
Resp Syncytial Virus by PCR: NEGATIVE
SARS Coronavirus 2 by RT PCR: NEGATIVE

## 2024-11-11 LAB — PRO BRAIN NATRIURETIC PEPTIDE: Pro Brain Natriuretic Peptide: 4787 pg/mL — ABNORMAL HIGH

## 2024-11-11 LAB — FOLATE: Folate: 7.7 ng/mL

## 2024-11-11 LAB — VITAMIN B12: Vitamin B-12: 1349 pg/mL — ABNORMAL HIGH (ref 180–914)

## 2024-11-11 LAB — FERRITIN: Ferritin: 938 ng/mL — ABNORMAL HIGH (ref 24–336)

## 2024-11-11 LAB — CBG MONITORING, ED: Glucose-Capillary: 187 mg/dL — ABNORMAL HIGH (ref 70–99)

## 2024-11-11 MED ORDER — SODIUM ZIRCONIUM CYCLOSILICATE 10 G PO PACK
10.0000 g | PACK | Freq: Once | ORAL | Status: AC
  Administered 2024-11-11: 10 g via ORAL
  Filled 2024-11-11: qty 1

## 2024-11-11 MED ORDER — INSULIN ASPART 100 UNIT/ML IJ SOLN
5.0000 [IU] | Freq: Once | INTRAMUSCULAR | Status: AC
  Administered 2024-11-11: 5 [IU] via INTRAVENOUS
  Filled 2024-11-11: qty 5

## 2024-11-11 MED ORDER — LACTATED RINGERS IV BOLUS
1000.0000 mL | Freq: Once | INTRAVENOUS | Status: AC
  Administered 2024-11-11: 1000 mL via INTRAVENOUS

## 2024-11-11 MED ORDER — SODIUM CHLORIDE 0.9 % IV BOLUS
1000.0000 mL | Freq: Once | INTRAVENOUS | Status: AC
  Administered 2024-11-11: 1000 mL via INTRAVENOUS

## 2024-11-11 MED ORDER — SODIUM CHLORIDE 0.9 % IV SOLN
2.0000 g | Freq: Once | INTRAVENOUS | Status: AC
  Administered 2024-11-11: 2 g via INTRAVENOUS
  Filled 2024-11-11: qty 12.5

## 2024-11-11 MED ORDER — SODIUM BICARBONATE 8.4 % IV SOLN
50.0000 meq | Freq: Once | INTRAVENOUS | Status: AC
  Administered 2024-11-11: 50 meq via INTRAVENOUS
  Filled 2024-11-11: qty 50

## 2024-11-11 MED ORDER — CALCIUM GLUCONATE-NACL 1-0.675 GM/50ML-% IV SOLN
1.0000 g | Freq: Once | INTRAVENOUS | Status: AC
  Administered 2024-11-11: 1000 mg via INTRAVENOUS
  Filled 2024-11-11: qty 50

## 2024-11-11 MED ORDER — DEXTROSE 50 % IV SOLN
1.0000 | Freq: Once | INTRAVENOUS | Status: AC
  Administered 2024-11-11: 50 mL via INTRAVENOUS
  Filled 2024-11-11: qty 50

## 2024-11-11 MED ORDER — CHLORHEXIDINE GLUCONATE CLOTH 2 % EX PADS
6.0000 | MEDICATED_PAD | Freq: Every day | CUTANEOUS | Status: DC
  Administered 2024-11-11 – 2024-12-02 (×19): 6 via TOPICAL

## 2024-11-11 MED ORDER — SENNA 8.6 MG PO TABS
1.0000 | ORAL_TABLET | Freq: Two times a day (BID) | ORAL | Status: DC | PRN
  Administered 2024-11-18 – 2024-11-19 (×2): 8.6 mg via ORAL
  Filled 2024-11-11 (×2): qty 1

## 2024-11-11 MED ORDER — SODIUM CHLORIDE 0.9 % IV SOLN
250.0000 mL | INTRAVENOUS | Status: AC
  Administered 2024-11-11: 250 mL via INTRAVENOUS

## 2024-11-11 MED ORDER — SODIUM CHLORIDE 0.9 % IV SOLN
2.0000 g | INTRAVENOUS | Status: DC
  Filled 2024-11-11: qty 20

## 2024-11-11 MED ORDER — ACETAMINOPHEN 325 MG PO TABS
650.0000 mg | ORAL_TABLET | ORAL | Status: DC | PRN
  Administered 2024-11-12 – 2024-11-30 (×4): 650 mg via ORAL
  Filled 2024-11-11 (×5): qty 2

## 2024-11-11 MED ORDER — POLYETHYLENE GLYCOL 3350 17 G PO PACK
17.0000 g | PACK | Freq: Every day | ORAL | Status: DC | PRN
  Administered 2024-11-18 – 2024-11-19 (×2): 17 g via ORAL
  Filled 2024-11-11 (×2): qty 1

## 2024-11-11 MED ORDER — VANCOMYCIN HCL 1500 MG/300ML IV SOLN
1500.0000 mg | Freq: Once | INTRAVENOUS | Status: AC
  Administered 2024-11-11: 1500 mg via INTRAVENOUS
  Filled 2024-11-11: qty 300

## 2024-11-11 MED ORDER — NOREPINEPHRINE 4 MG/250ML-% IV SOLN
0.0000 ug/min | INTRAVENOUS | Status: DC
  Administered 2024-11-11: 15 ug/min via INTRAVENOUS
  Administered 2024-11-12: 11 ug/min via INTRAVENOUS
  Administered 2024-11-12 (×2): 10 ug/min via INTRAVENOUS
  Filled 2024-11-11 (×3): qty 250

## 2024-11-11 MED ORDER — NOREPINEPHRINE 4 MG/250ML-% IV SOLN
0.0000 ug/min | INTRAVENOUS | Status: DC
  Administered 2024-11-11: 5 ug/min via INTRAVENOUS
  Administered 2024-11-11: 15 ug/min via INTRAVENOUS
  Filled 2024-11-11: qty 250

## 2024-11-11 MED ORDER — HEPARIN SODIUM (PORCINE) 5000 UNIT/ML IJ SOLN
5000.0000 [IU] | Freq: Three times a day (TID) | INTRAMUSCULAR | Status: DC
  Administered 2024-11-11 – 2024-11-12 (×4): 5000 [IU] via SUBCUTANEOUS
  Filled 2024-11-11 (×4): qty 1

## 2024-11-11 MED ORDER — SODIUM BICARBONATE 8.4 % IV SOLN
INTRAVENOUS | Status: AC
  Filled 2024-11-11: qty 150
  Filled 2024-11-11: qty 1000

## 2024-11-11 MED ORDER — METRONIDAZOLE 500 MG/100ML IV SOLN
500.0000 mg | Freq: Once | INTRAVENOUS | Status: AC
  Administered 2024-11-11: 500 mg via INTRAVENOUS
  Filled 2024-11-11: qty 100

## 2024-11-11 MED ORDER — SODIUM CHLORIDE 0.9 % IV BOLUS
500.0000 mL | Freq: Once | INTRAVENOUS | Status: AC
  Administered 2024-11-11: 500 mL via INTRAVENOUS

## 2024-11-11 NOTE — Progress Notes (Signed)
 eLink Physician-Brief Progress Note Patient Name: BARD HAUPERT DOB: 1941-07-08 MRN: [id number]   Date of Service  11/11/2024  HPI/Events of Note  RN reports pt on 15 mcg peripheral levo, max allowed is 10 mcg.  93/48 (58), HR 89 (please change levo order - ordered as central line dosing)  Reports pt had coude cath placed, draining bloody, sedimentary urine  Camera: On room air, watching TV, HR is normal, sats good.  AKI, septic shock/UTI   eICU Interventions  Discussed with RN. Should be able to handle fluid bolus. Ordered LR1 lit. Wean levophed  as tolerated. On prn coude foley fluses, above fluid bolus should help also.      Intervention Category Intermediate Interventions: Hypotension - evaluation and management;Other:  Jodelle ONEIDA Hutching 11/11/2024, 9:27 PM

## 2024-11-11 NOTE — ED Notes (Signed)
 Xray at bedside

## 2024-11-11 NOTE — Consult Note (Signed)
 [name] Admit Date: 11/11/2024 11/11/2024 Bernardino KATHEE Gasman Requesting Physician:  Dreama MD  Reason for Consult:  AKI, Hyperkalemia HPI:  31M who presented to the ED earlier today with progressive fatigue, malaise, anorexia, confusion and found to have severe AKI with hyperkalemia  PMH Incudes: CAD with history of STEMI in 2008 with PCI Chronic HFrEF with ischemic myopathy Farxiga Hypertension on losartan , spironolactone , carvedilol  DM2 Baseline GFR not clear, no labs since 2018 prior to presentation  Since the Christmas time the patient has had subacute decline in strength, stamina, more confusion, more sleepiness.  He has had significant urinary urgency with little output during this time as well.  He has been taking his blood pressure medicines throughout.  Presenting to the ED he had a creatinine of 14 with a BUN greater than 130, potassium 7.0 and bicarbonate of 11.  Has received temporizing measures with insulin /glucose, calcium .  Bicarbonate and Lokelma  have been ordered.  He has received 1 L of bolus fluids.  Blood pressures have been soft but he is awake and mentating.  While in bladder was felt to be palpable.  Initial Foley catheter placement unsuccessful but repeated attempts resulted in drainage of purulent/cloudy urine with scattered blood clots, significant volume return right away.  Son is at bedside, confirming much of the history.  EKG independently reviewed without peaked T waves or prolongation of QRS interval.   Creat (mg/dL)  Date Value  94/78/7981 1.21 (H)  03/07/2017 1.17  10/30/2016 1.14   Creatinine, Ser  Date Value  11/11/2024 14.00 mg/dL (H)  93/88/7981 8.89 mg/dL  93/94/7981 8.93 mg/dL  93/82/7991 9.11  93/85/7991 1.09  04/11/2007 0.82  ] ROS NSAIDS: Denies any use prior to presentation IV Contrast no exposure TMP/SMX no exposure Hypotension present at admission Balance of 12 systems is negative w/ exceptions as above  PMH  Past  Medical History:  Diagnosis Date   CHF (congestive heart failure) (HCC)    past echo in 2010 showed an EF of 50 to 55% with mild AS and mild mitral insufficiency   Coronary artery disease 2008   Prior anterior MI (with syncope as the initial presentation) with single vessel LAD occlusive artherosclerotic CAD -- post stent of the mid LAD   CVA (cerebral vascular accident) (HCC) 2008   at the time of his MI   Diabetes mellitus    Edema    Heart murmur    Hyperlipidemia    Hypertension    Long term current use of anticoagulant    previously on coumadin therapy.    Mitral insufficiency    Myocardial infarction, old 2008   Obesity    PSH  Past Surgical History:  Procedure Laterality Date   CORONARY ANGIOPLASTY WITH STENT PLACEMENT  04/10/2007   with drug-eluting stent placement of the mid LAD   KNEE SURGERY     FH  Family History  Problem Relation Age of Onset   Dementia Mother    Heart Problems Father    Coronary artery disease Neg Hx    Congestive Heart Failure Neg Hx    SH  reports that he quit smoking about 49 years ago. His smoking use included cigarettes. He has never used smokeless tobacco. He reports that he does not drink alcohol and does not use drugs. Allergies Allergies[1] Home medications Prior to Admission medications  Medication Sig Start Date End Date Taking? Authorizing Provider  aspirin 81 MG tablet Take 81 mg by mouth daily.    [provider]  atorvastatin  (LIPITOR) 80 MG tablet Take 1 tablet (80 mg total) by mouth daily. 11/03/24   Jordan, Peter M, MD  carvedilol  (COREG ) 12.5 MG tablet Take 1 tablet (12.5 mg total) by mouth 2 (two) times daily. 11/03/24 02/01/25  Jordan, Peter M, MD  dapagliflozin propanediol (FARXIGA) 5 MG TABS tablet Take 5 mg by mouth daily. Pt stated that he takes 1 tab a day    [provider]  furosemide  (LASIX ) 20 MG tablet Take 1 tablet (20 mg total) by mouth daily. 11/03/24   Jordan, Peter M, MD  losartan  (COZAAR ) 100 MG  tablet Take 1 tablet (100 mg total) by mouth daily. 11/03/24   Jordan, Peter M, MD  Lutein-Zeaxanthin (OCUVITE LUTEIN 25 PO) Take 1 capsule by mouth daily.    [provider]  Multiple Vitamins-Minerals (CENTRUM SILVER PO) Take 1 tablet by mouth daily.     [provider]  nitroGLYCERIN  (NITROSTAT ) 0.4 MG SL tablet Place 1 tablet (0.4 mg total) under the tongue every 5 (five) minutes as needed for chest pain (x 3 doses). 11/03/24   Jordan, Peter M, MD  spironolactone  (ALDACTONE ) 25 MG tablet Take 1/2 tablet by mouth (12.5 mg) daily 11/03/24   Jordan, Peter M, MD    Current Medications Scheduled Meds:  sodium bicarbonate   50 mEq Intravenous Once   sodium zirconium cyclosilicate   10 g Oral Once   Continuous Infusions:  calcium  gluconate 1,000 mg (11/11/24 1315)   ceFEPime  (MAXIPIME ) IV     metronidazole      norepinephrine  (LEVOPHED ) Adult infusion     sodium chloride      vancomycin      PRN Meds:.  CBC Recent Labs  Lab 11/11/24 1230 11/11/24 1232 11/11/24 1336  WBC 20.7*  --   --   NEUTROABS 18.9*  --   --   HGB 9.2* 8.8* 7.8*  HCT 28.7* 26.0* 23.0*  MCV 102.1*  --   --   PLT 276  --   --    Basic Metabolic Panel Recent Labs  Lab 11/11/24 1232 11/11/24 1336  NA 128* 130*  K 7.0* 5.9*  CL 106  --   GLUCOSE 181*  --   BUN >130*  --   CREATININE 14.00*  --     Physical Exam  Blood pressure (!) 74/34, pulse 68, temperature 97.9 F (36.6 C), resp. rate 15, height 5' 5 (1.651 m), weight 72.6 kg, SpO2 100%. GEN: Elderly male awake, NAD ENT: NCAT EYES: EOMI CV: Regular, no murmur or rub PULM: Clear throughout, normal work of breathing ABD: Question of a palpable bladder in the suprapubic region, no other masses, nontender SKIN: No significant lower extremity edema EXT: No LEE  Assessment 58M with severe AKI (baseline GFR unclear), severe life threatening hyperkalmeia, metabolic acidosis.  Severe AKI likely from obstructive uropathy Hyperkalemia,  severe, life threatening from obstructive uropathy using ARB and MRB; CKD stable Metabolic Acidosis, lactate WNL, likely due to #1 Purulent UTI l with underlying progressive BPH and BOO Hypotension / shock, likely from septic shock/hypovolemia Leukocytosis, WBC 21, as above Macrocytic anemia  Plan With successful placement of Foley catheter, I am hopeful for improvement in electrolytes, potassium, renal function over the next 12 hours.  No immediate plans for dialysis, continue with supportive care and see how labs change CCM to evaluate for septic shock from UTI Will order another bolus of IV fluids and placed on D5W plus 3amps bicarbonate Trend serial BMP over the next 24h, q4h.  Bernardino KATHEE Gasman  11/11/2024, 1:52 PM         [1] No Known Allergies

## 2024-11-11 NOTE — ED Notes (Signed)
 US  at bedside.

## 2024-11-11 NOTE — ED Provider Notes (Signed)
 " [place] EMERGENCY DEPARTMENT AT [facility] Provider Note   CSN: [id number] Arrival date & time: 11/11/24  1209     Patient presents with: Weakness and no appetite   [name] is a 84 y.o. male.   HPI     84 year old male with a history of coronary artery disease with anterior STEMI in 2008, ischemic cardiomyopathy/chronic combined CHF with an EF of 45 to 50%, mild mitral regurgitation, hypertension, hyperlipidemia, type 2 diabetes, CVA who presents with concern for progressive generalized weakness and low appetite.  He reports that over the last several weeks, since around Christmas time he has had generalized weakness that has been increasing.  Reports decreased appetite.  Food just does not taste good anymore he reports.  He reports that his  physician recently took him off of his metformin and his carvedilol .  He denies starting any new medications.  He reports he has been urinating small amounts frequently.  Denies any dysuria, fever, cough.  He reports the leg swelling that he has had in the past actually seems to be less now.  Reports just feeling generally unwell.  He did have a fall where he reports he could have caught himself but did hit the back of his head.  He denies any pain including no headache, chest pain, back pain, abdominal pain.  Past Medical History:  Diagnosis Date   CHF (congestive heart failure) (HCC)    past echo in 2010 showed an EF of 50 to 55% with mild AS and mild mitral insufficiency   Coronary artery disease 2008   Prior anterior MI (with syncope as the initial presentation) with single vessel LAD occlusive artherosclerotic CAD -- post stent of the mid LAD   CVA (cerebral vascular accident) (HCC) 2008   at the time of his MI   Diabetes mellitus    Edema    Heart murmur    Hyperlipidemia    Hypertension    Long term current use of anticoagulant    previously on coumadin therapy.    Mitral insufficiency    Myocardial infarction,  old 2008   Obesity     Prior to Admission medications  Medication Sig Start Date End Date Taking? Authorizing Provider  aspirin 81 MG tablet Take 81 mg by mouth at bedtime.   Yes [provider]  atorvastatin  (LIPITOR) 80 MG tablet Take 1 tablet (80 mg total) by mouth daily. Patient taking differently: Take 80 mg by mouth at bedtime. 11/03/24  Yes Jordan, Peter M, MD  carvedilol  (COREG ) 12.5 MG tablet Take 1 tablet (12.5 mg total) by mouth 2 (two) times daily. 11/03/24 02/01/25 Yes Jordan, Peter M, MD  dapagliflozin propanediol (FARXIGA) 5 MG TABS tablet Take 5 mg by mouth at bedtime.   Yes [provider]  furosemide  (LASIX ) 20 MG tablet Take 1 tablet (20 mg total) by mouth daily. 11/03/24  Yes Jordan, Peter M, MD  losartan  (COZAAR ) 100 MG tablet Take 1 tablet (100 mg total) by mouth daily. 11/03/24  Yes Jordan, Peter M, MD  Lutein-Zeaxanthin (OCUVITE LUTEIN 25 PO) Take 1 capsule by mouth at bedtime.   Yes [provider]  Multiple Vitamins-Minerals (CENTRUM SILVER PO) Take 1 tablet by mouth daily.    Yes [provider]  nitroGLYCERIN  (NITROSTAT ) 0.4 MG SL tablet Place 1 tablet (0.4 mg total) under the tongue every 5 (five) minutes as needed for chest pain (x 3 doses). 11/03/24  Yes Jordan, Peter M, MD  spironolactone  (  ALDACTONE ) 25 MG tablet Take 1/2 tablet by mouth (12.5 mg) daily 11/03/24  Yes Jordan, Peter M, MD    Allergies: Patient has no known allergies.    Review of Systems  Updated Vital Signs BP (!) 110/48   Pulse 93   Temp 98.1 F (36.7 C) (Oral)   Resp 12   Ht 5' 5 (1.651 m)   Wt 75.7 kg   SpO2 98%   BMI 27.77 kg/m   Physical Exam Vitals and nursing note reviewed.  Constitutional:      General: He is not in acute distress.    Appearance: He is well-developed. He is not diaphoretic.  HENT:     Head: Normocephalic.     Comments: Occipital contusion Eyes:     Conjunctiva/sclera: Conjunctivae normal.  Cardiovascular:     Rate and Rhythm:  Normal rate and regular rhythm.     Heart sounds: Normal heart sounds. No murmur heard.    No friction rub. No gallop.  Pulmonary:     Effort: Pulmonary effort is normal. No respiratory distress.     Breath sounds: Normal breath sounds. No wheezing or rales.  Abdominal:     General: There is no distension.     Palpations: Abdomen is soft.     Tenderness: There is no abdominal tenderness. There is no guarding.     Comments: Suprapubic fullness   Musculoskeletal:        General: No tenderness.     Cervical back: Normal range of motion.     Comments: Contusion to thoracic back   Skin:    General: Skin is warm and dry.  Neurological:     Mental Status: He is alert and oriented to person, place, and time.     (all labs ordered are listed, but only abnormal results are displayed) Labs Reviewed  URINALYSIS, ROUTINE W REFLEX MICROSCOPIC - Abnormal; Notable for the following components:      Result Value   Color, Urine AMBER (*)    APPearance TURBID (*)    Hgb urine dipstick LARGE (*)    Protein, ur >=300 (*)    Leukocytes,Ua LARGE (*)    Bacteria, UA FEW (*)    All other components within normal limits  CBC WITH DIFFERENTIAL/PLATELET - Abnormal; Notable for the following components:   WBC 20.7 (*)    RBC 2.81 (*)    Hemoglobin 9.2 (*)    HCT 28.7 (*)    MCV 102.1 (*)    RDW 16.5 (*)    Neutro Abs 18.9 (*)    Lymphs Abs 0.5 (*)    Monocytes Absolute 1.1 (*)    Abs Immature Granulocytes 0.18 (*)    All other components within normal limits  PRO BRAIN NATRIURETIC PEPTIDE - Abnormal; Notable for the following components:   Pro Brain Natriuretic Peptide 4,787.0 (*)    All other components within normal limits  COMPREHENSIVE METABOLIC PANEL WITH GFR - Abnormal; Notable for the following components:   Sodium 134 (*)    Potassium 6.0 (*)    CO2 10 (*)    Glucose, Bld 179 (*)    BUN 194 (*)    Creatinine, Ser 11.60 (*)    Calcium  8.5 (*)    Total Protein 5.7 (*)    Albumin  2.5 (*)    GFR, Estimated 4 (*)    Anion gap 21 (*)    All other components within normal limits  BASIC METABOLIC PANEL WITH GFR - Abnormal; Notable  for the following components:   Potassium 5.2 (*)    Chloride 95 (*)    Glucose, Bld 568 (*)    BUN 163 (*)    Creatinine, Ser 9.37 (*)    Calcium  6.8 (*)    GFR, Estimated 5 (*)    Anion gap 16 (*)    All other components within normal limits  BASIC METABOLIC PANEL WITH GFR - Abnormal; Notable for the following components:   Sodium 134 (*)    Potassium 5.8 (*)    CO2 10 (*)    Glucose, Bld 211 (*)    BUN 182 (*)    Creatinine, Ser 10.80 (*)    Calcium  8.0 (*)    GFR, Estimated 4 (*)    Anion gap 23 (*)    All other components within normal limits  VITAMIN B12 - Abnormal; Notable for the following components:   Vitamin B-12 1,349 (*)    All other components within normal limits  IRON AND TIBC - Abnormal; Notable for the following components:   TIBC 121 (*)    Saturation Ratios 84 (*)    All other components within normal limits  FERRITIN - Abnormal; Notable for the following components:   Ferritin 938 (*)    All other components within normal limits  RETICULOCYTES - Abnormal; Notable for the following components:   RBC. 2.01 (*)    All other components within normal limits  CBC - Abnormal; Notable for the following components:   WBC 21.8 (*)    RBC 2.40 (*)    Hemoglobin 7.7 (*)    HCT 23.5 (*)    RDW 16.5 (*)    All other components within normal limits  COMPREHENSIVE METABOLIC PANEL WITH GFR - Abnormal; Notable for the following components:   Sodium 134 (*)    Potassium 5.4 (*)    Chloride 96 (*)    CO2 21 (*)    Glucose, Bld 447 (*)    BUN 170 (*)    Creatinine, Ser 9.82 (*)    Calcium  7.1 (*)    Total Protein 4.9 (*)    Albumin 2.2 (*)    GFR, Estimated 5 (*)    Anion gap 17 (*)    All other components within normal limits  GLUCOSE, CAPILLARY - Abnormal; Notable for the following components:    Glucose-Capillary 189 (*)    All other components within normal limits  CBG MONITORING, ED - Abnormal; Notable for the following components:   Glucose-Capillary 187 (*)    All other components within normal limits  I-STAT CHEM 8, ED - Abnormal; Notable for the following components:   Sodium 128 (*)    Potassium 7.0 (*)    BUN >130 (*)    Creatinine, Ser 14.00 (*)    Glucose, Bld 181 (*)    Calcium , Ion 1.10 (*)    TCO2 11 (*)    Hemoglobin 8.8 (*)    HCT 26.0 (*)    All other components within normal limits  I-STAT VENOUS BLOOD GAS, ED - Abnormal; Notable for the following components:   pH, Ven 7.178 (*)    pCO2, Ven 23.5 (*)    pO2, Ven 51 (*)    Bicarbonate 8.7 (*)    TCO2 9 (*)    Acid-base deficit 18.0 (*)    Sodium 130 (*)    Potassium 5.9 (*)    HCT 23.0 (*)    Hemoglobin 7.8 (*)    All other components  within normal limits  TROPONIN T, HIGH SENSITIVITY - Abnormal; Notable for the following components:   Troponin T High Sensitivity 60 (*)    All other components within normal limits  TROPONIN T, HIGH SENSITIVITY - Abnormal; Notable for the following components:   Troponin T High Sensitivity 71 (*)    All other components within normal limits  RESP PANEL BY RT-PCR (RSV, FLU A&B, COVID)  RVPGX2  MRSA NEXT GEN BY PCR, NASAL  CULTURE, BLOOD (ROUTINE X 2)  CULTURE, BLOOD (ROUTINE X 2)  MRSA NEXT GEN BY PCR, NASAL  FOLATE  BASIC METABOLIC PANEL WITH GFR  BASIC METABOLIC PANEL WITH GFR  CBC  MAGNESIUM   PHOSPHORUS  I-STAT CG4 LACTIC ACID, ED  I-STAT CG4 LACTIC ACID, ED    EKG: EKG Interpretation Date/Time:  Wednesday November 11 2024 16:41:16 EST Ventricular Rate:  94 PR Interval:  36 QRS Duration:  138 QT Interval:  385 QTC Calculation: 477 R Axis:   4  Text Interpretation: Sinus rhythm Short PR interval Nonspecific intraventricular conduction delay Consider anterior infarct No significant change since last tracing Confirmed by Dreama Longs ([id number]) on  11/11/2024 10:29:34 PM  Radiology: US  RENAL Result Date: 11/11/2024 CLINICAL DATA:  Renal failure EXAM: RENAL / URINARY TRACT ULTRASOUND COMPLETE COMPARISON:  11/11/2024 FINDINGS: Right Kidney: Renal measurements: 13.0 x 7.7 by 4.7 cm = volume: 248.2 mL. Moderate renal cortical thinning. Normal echotexture. Severe hydronephrosis similar to recent CT. Left Kidney: Renal measurements: 9.3 x 5.0 x 4.1 cm = volume: 101.3 mL. Moderate renal cortical atrophy. Normal renal cortical echotexture. Severe hydronephrosis similar to recent CT. Bladder: The bladder is decompressed by Foley catheter, with limited visualization. Marked bladder wall thickening and trabeculation were seen on preceding CT. Other: None. IMPRESSION: 1. Moderate bilateral renal cortical thinning. 2. Severe bilateral hydronephrosis, likely due to chronic bladder outlet obstruction from an enlarged prostate. 3. Nonvisualization of the bladder due to decompression by Foley catheter. Please refer to recent CT exam for bladder findings. Electronically Signed   By: [name] Daring M.D.   On: 11/11/2024 16:18   CT Head Wo Contrast Result Date: 11/11/2024 CLINICAL DATA:  Head trauma EXAM: CT HEAD WITHOUT CONTRAST TECHNIQUE: Contiguous axial images were obtained from the base of the skull through the vertex without intravenous contrast. RADIATION DOSE REDUCTION: This exam was performed according to the departmental dose-optimization program which includes automated exposure control, adjustment of the mA and/or kV according to patient size and/or use of iterative reconstruction technique. COMPARISON:  04/11/2007 FINDINGS: Brain: Encephalomalacia related to prior right parietal infarct. No evidence of acute infarct or hemorrhage. Lateral ventricles and midline structures are unremarkable. No acute extra-axial fluid collections. No mass effect. Vascular: No hyperdense vessel or unexpected calcification. Skull: Normal. Negative for fracture or focal lesion.  Sinuses/Orbits: No acute finding. Other: None. IMPRESSION: 1. Chronic right parietal infarct. 2. No acute intracranial process. Electronically Signed   By: [name] Daring M.D.   On: 11/11/2024 14:58   CT ABDOMEN PELVIS WO CONTRAST Result Date: 11/11/2024 CLINICAL DATA:  Fall, abdominal trauma, evaluate for retroperitoneal hematoma. EXAM: CT ABDOMEN AND PELVIS WITHOUT CONTRAST TECHNIQUE: Multidetector CT imaging of the abdomen and pelvis was performed following the standard protocol without IV contrast. RADIATION DOSE REDUCTION: This exam was performed according to the departmental dose-optimization program which includes automated exposure control, adjustment of the mA and/or kV according to patient size and/or use of iterative reconstruction technique. COMPARISON:  None Available. FINDINGS: Lower chest: Possible mild basilar interstitial lung abnormality.  Atherosclerotic calcification of the aorta, aortic valve and coronary arteries. Heart is mildly enlarged. No pericardial or pleural effusion. Distal esophagus is grossly unremarkable. Hepatobiliary: Liver and gallbladder are unremarkable. No biliary ductal dilatation. Pancreas: Negative. Spleen: Negative. Adrenals/Urinary Tract: Adrenal glands are unremarkable. Severe bilateral hydronephrosis. Marked bladder wall thickening. Foley catheter and air in the bladder which is largely decompressed. Somewhat loculated perivesical fluid associated stranding. Stomach/Bowel: Stomach, small bowel, appendix and colon are unremarkable. Vascular/Lymphatic: Atherosclerotic calcification of the aorta. No pathologically enlarged lymph nodes. Reproductive: Enlarged prostate. Other: No free fluid. Musculoskeletal: Degenerative changes in the spine. No fracture. Mild levoconvex scoliosis. IMPRESSION: 1. Somewhat loculated appearing fluid around the bladder with inflammatory haziness and stranding, findings which may be due to trauma/contusion. Alternatively, given bladder wall  thickening and an enlarged prostate, an element of outlet obstruction with associated cystitis could have this appearance. 2. Associated severe bilateral hydronephrosis. 3. Otherwise, no evidence of acute trauma. 4. Suspect mild basilar interstitial lung abnormality (ILA). Electronically Signed   By: Newell Eke M.D.   On: 11/11/2024 14:53   DG Chest Portable 1 View Result Date: 11/11/2024 CLINICAL DATA:  Weakness. EXAM: PORTABLE CHEST 1 VIEW COMPARISON:  04/11/2007 FINDINGS: Lungs are adequately inflated and otherwise clear. Cardiomediastinal silhouette and remainder of the exam is unchanged. IMPRESSION: No active disease. Electronically Signed   By: Toribio Agreste M.D.   On: 11/11/2024 13:47     .Critical Care  Performed by: Dreama Longs, MD Authorized by: Dreama Longs, MD   Critical care provider statement:    Critical care time (minutes):  60   Critical care was time spent personally by me on the following activities:  Development of treatment plan with patient or surrogate, discussions with consultants, evaluation of patient's response to treatment, examination of patient, ordering and review of laboratory studies, ordering and review of radiographic studies, ordering and performing treatments and interventions, pulse oximetry, re-evaluation of patient's condition and review of old charts    Medications Ordered in the ED  sodium bicarbonate  150 mEq in dextrose  5 % 1,150 mL infusion ( Intravenous Infusion Verify 11/11/24 2000)  heparin  injection 5,000 Units (5,000 Units Subcutaneous Given 11/11/24 2143)  Chlorhexidine  Gluconate Cloth 2 % PADS 6 each (6 each Topical Given 11/11/24 1839)  acetaminophen  (TYLENOL ) tablet 650 mg (has no administration in time range)  cefTRIAXone  (ROCEPHIN ) 2 g in sodium chloride  0.9 % 100 mL IVPB (has no administration in time range)  polyethylene glycol (MIRALAX  / GLYCOLAX ) packet 17 g (has no administration in time range)  senna (SENOKOT) tablet 8.6  mg (has no administration in time range)  0.9 %  sodium chloride  infusion (250 mLs Intravenous New Bag/Given 11/11/24 2150)  norepinephrine  (LEVOPHED ) 4mg  in (0.016 mg/mL) premix infusion (13 mcg/min Intravenous Rate/Dose Change 11/11/24 2230)  lactated ringers  bolus 1,000 mL (0 mLs Intravenous Stopped 11/11/24 1255)  insulin  aspart (novoLOG ) injection 5 Units (5 Units Intravenous Given 11/11/24 1311)  calcium  gluconate 1 g/ 50 mL sodium chloride  IVPB (0 mg Intravenous Stopped 11/11/24 1411)  dextrose  50 % solution 50 mL (50 mLs Intravenous Given 11/11/24 1308)  sodium chloride  0.9 % bolus 1,000 mL (0 mLs Intravenous Stopped 11/11/24 1410)  metroNIDAZOLE  (FLAGYL ) IVPB 500 mg (0 mg Intravenous Stopped 11/11/24 1558)  sodium zirconium cyclosilicate  (LOKELMA ) packet 10 g (10 g Oral Given 11/11/24 1507)  vancomycin  (VANCOREADY) IVPB 1500 mg/300 mL (0 mg Intravenous Stopped 11/11/24 1702)  ceFEPIme  (MAXIPIME ) 2 g in sodium chloride  0.9 % 100 mL IVPB (0 g  Intravenous Stopped 11/11/24 1457)  sodium chloride  0.9 % bolus 1,000 mL (0 mLs Intravenous Stopped 11/11/24 1456)  sodium bicarbonate  injection 50 mEq (50 mEq Intravenous Given 11/11/24 1414)  sodium chloride  0.9 % bolus 500 mL (0 mLs Intravenous Stopped 11/11/24 1518)  sodium bicarbonate  injection 50 mEq (50 mEq Intravenous Given 11/11/24 1633)  sodium zirconium cyclosilicate  (LOKELMA ) packet 10 g (10 g Oral Given 11/11/24 2035)  lactated ringers  bolus 1,000 mL (1,000 mLs Intravenous New Bag/Given 11/11/24 2143)                                      84 year old male with a history of coronary artery disease with anterior STEMI in 2008, ischemic cardiomyopathy/chronic combined CHF with an EF of 45 to 50%, mild mitral regurgitation, hypertension, hyperlipidemia, type 2 diabetes, CVA who presents with concern for progressive generalized weakness and low appetite.  EKG shows sinus rhythm.  ISTAT labs significant for hyperkalemia, BUN greater than 130,  bicarb 11, Cr 14, hgb 8.8. No significant ECG changes, however given degree of hyperkalemia on istat labs, ordered calcium  gluconate, insulin , dextrose  and also lokelma .    Blood pressures in 50s on arrival. Initiially higher suspicion for dehydration or possible medication effects in setting of renal failure and gave IV fluids.  WBC retruend significantly elevated, and ordered empiric abx for possible sepsis.  Blood pressures to 80s systolic but not having significant improvement with initial fluid resuscitation and will place on pressors.  Has normal lactic acid and normal mentation.  Given continued low MAPs, however, will place on pressors and consult ICU.  Nephrology consulted given severe AKI, elevated BUN and hyperkalemia.  Foley catheter placed, initial obstruction but then with purulent appearing urine after irritation. Suspect this likely etiology of infection, leukocytosis, sepsis and also suspect obstructive phenomenon/prostatic hypertrophy contributing to AKI.  Nephrology recommends amp bicarb and will place other bicarb containing fluids.  CT abdomen pelvis obtained to evaluate for signs of obstruction, or other abnormalities related to fall.  CT head ordered and pending given history of fall.  PCCM to admit in setting of renal failure, hypotension.        Final diagnoses:  Acute renal failure, unspecified acute renal failure type  Sepsis, due to unspecified organism, unspecified whether acute organ dysfunction present [facility])  Urinary tract infection with hematuria, site unspecified  Urinary retention  Metabolic acidosis  Hypotension, unspecified hypotension type    ED Discharge Orders     None          Dreama Longs, MD 11/11/24 2312  "

## 2024-11-11 NOTE — ED Notes (Signed)
 First attempt to insert foley cath unsuccessful.

## 2024-11-11 NOTE — ED Provider Triage Note (Signed)
 Emergency Medicine Provider Triage Evaluation Note  [name] , a 84 y.o. male  was evaluated in triage.  Pt complains of increasing fatigue and weakness since Christmas.  Denies any fall or injury.  States he is not having any pain including headaches, dizziness, chest pain, shortness of breath.  States she is generally weak and increased difficulty ambulating.  Ambulates with a cane.  Lives with son.  No recent illness.  Review of Systems  Positive: Weakness, fatigue Negative: Chest pain, shortness of breath  Physical Exam  BP (!) 58/38 (BP Location: Right Arm)   Pulse 69   Temp 97.9 F (36.6 C)   Resp 18   SpO2 100%  Gen:   Awake, no distress   Resp:  Normal effort  MSK:   Moves extremities without difficulty  Other:  Generally weak but no acute deficits.  Medical Decision Making  Medically screening exam initiated at 12:32 PM.  Appropriate orders placed.  KRAYTON WORTLEY was informed that the remainder of the evaluation will be completed by another provider, this initial triage assessment does not replace that evaluation, and the importance of remaining in the ED until their evaluation is complete.  Patient's blood pressures are critically low, charge nurse notified and will be taken to room for evaluation.     Neysa Thersia RAMAN, PA-C 11/11/24 1233

## 2024-11-11 NOTE — ED Triage Notes (Signed)
 Son stated, He has no appetite and therefore he is eating less and less making him weaker and weaker.

## 2024-11-11 NOTE — ED Notes (Signed)
 Number 16 fr coude cath placed on first attempt, placed without significant resistance, no immediate urine output noted, md and admitting md notified, er md Schlossman at bedside with ultrasound, placement confirmed via ultrasound, inflated balloon with md approval, balloon visualized on ultrasound, irrigated foley cath, several clots out and milky urine, pt then drained aprox 500 ml of milky pink urine with occasional clot, irrigated foley per md instructions, irrigated with aprox 500 ml of ns, sporadic clots out, pt now has pink/red tinged urine.

## 2024-11-11 NOTE — ED Triage Notes (Signed)
 In addition to initial triage note. Pt is AxOx4. He states the weakness is all over. He is notably pale during triage. No n/v/d. Reports slight dizziness.

## 2024-11-11 NOTE — ED Notes (Signed)
 Pt transported to CT ?

## 2024-11-11 NOTE — H&P (Signed)
 "  NAME:  [name], MRN:  [id number], DOB:  24-Sep-1941, LOS: 0 ADMISSION DATE:  11/11/2024, CONSULTATION DATE:  11/11/24 REFERRING MD:  Dr. Dreama, CHIEF COMPLAINT:  acute renal failure, hypotension   History of Present Illness:  [name] is a 84 y.o. male with PMH of HTN, HLD, NSTEMI s/p DES to LAD (2008), ischemic cardiomyopathy, HFmrEF, and evidence of prior renal insufficiency who presents to the ED today with complains of generalized weakness and fatigue, lack of energy, and decreased appetite with poor PO intake since christmas (Dec 2025). Patient reports during this time he had noticed decreased urine output as well. Patient reports no prior history of this.  On arrival to the ED patient was found to be hypotensive 80/40s, started on levophed . CBC with leukocytosis and anemia with hgb 7-9. BMP with cr 14, BUN > 130, K+ 7.0 and received 1L NS bolus, calcium , insulin , D50, lokelma  with repeat K+ 5.9. Foley placed in ED, urinalysis with large leukocytes and received cefepime  and vancomycin  in the ED, will start Rocephin  tomorrow and follow urine culture. Will obtain renal US , close monitoring of BMP q4h, continuous fluids, nephrology consult.  Pertinent  Medical History   has a past medical history of CHF (congestive heart failure) (HCC), Coronary artery disease (2008), CVA (cerebral vascular accident) (HCC) (2008), Diabetes mellitus, Edema, Heart murmur, Hyperlipidemia, Hypertension, Long term current use of anticoagulant, Mitral insufficiency, Myocardial infarction, old (2008), and Obesity.   Significant Hospital Events: Including procedures, antibiotic start and stop dates in addition to other pertinent events   1/14: admit to PCCM   Interim History / Subjective:  See HPI  Objective    Blood pressure (!) 87/74, pulse 90, temperature 97.9 F (36.6 C), resp. rate 16, height 5' 5 (1.651 m), weight 72.6 kg, SpO2 100%.       No intake or output data in the 24 hours ending  11/11/24 1521 Filed Weights   11/11/24 1243  Weight: 72.6 kg    Examination: Physical Exam Vitals and nursing note reviewed.  Constitutional:      Comments: Chronically ill appearing, pale  HENT:     Head: Normocephalic and atraumatic.     Nose: Nose normal.     Mouth/Throat:     Mouth: Mucous membranes are dry.     Pharynx: Oropharynx is clear.  Eyes:     Pupils: Pupils are equal, round, and reactive to light.  Cardiovascular:     Rate and Rhythm: Normal rate and regular rhythm.     Pulses: Normal pulses.     Heart sounds: Normal heart sounds.  Pulmonary:     Effort: Pulmonary effort is normal. No respiratory distress.     Breath sounds: Normal breath sounds. No wheezing.  Abdominal:     General: Bowel sounds are normal. There is no distension.     Palpations: Abdomen is soft.  Genitourinary:    Comments: foley Musculoskeletal:        General: Swelling (bilateral ankles) present.  Skin:    General: Skin is warm and dry.     Capillary Refill: Capillary refill takes less than 2 seconds.     Coloration: Skin is pale.  Neurological:     Mental Status: He is oriented to person, place, and time. Mental status is at baseline.      Resolved problem list   Assessment and Plan   Severe AKI on CKD 4 Hyperkalemia secondary to renal failure  Metabolic Acidosis  Shock- suspect multifactoral  with primary hypovolemic shock and possible septic shock with evidence of UTI Patient has had decreased appetite with poor PO intake since December, still taking his medications at home, with significantly decreased urine output -Cr 14, BUN > 130; Outpatient labs 12/2023: Cr 2.28, with GFR 28 -K+ 7.0 treated with Calcium , Insulin , D50, lokelma  in ED--> repeat K+ 5.9 -D5 Sodium bicarb 43ml/hr -On Levophed  -Hold home farxiga, lasix , aldactone , losartan   -Foley placed on admission -Check renal US  -BMP q4h -Nephrology consult   Urinary tract infection, present on  arrival -received cefepime  in ED 1/14; start Rocephin  2g 1/15 -Urine culture and blood cultures ordered   Acute on Chronic macrocytic anemia -Hgb 9.2-> 8.8-> 7.8 since being in the ED (baseline 11-12) -Check occult stool, anemia panel -Consider holding VTE prophylaxis   History of NSTEMI s/p DES to LAD (2008) History of ischemic cardiomyopathy History of HFmrEF -Last echo 2022: EF 45-50% -Holding home medications including carvedilol , lasix , losartan , aldactone  -Repeat echo ordered   HTN  HLD -holding home medications while on pressors and renal failure     Labs   CBC: Recent Labs  Lab 11/11/24 1230 11/11/24 1232 11/11/24 1336  WBC 20.7*  --   --   NEUTROABS 18.9*  --   --   HGB 9.2* 8.8* 7.8*  HCT 28.7* 26.0* 23.0*  MCV 102.1*  --   --   PLT 276  --   --     Basic Metabolic Panel: Recent Labs  Lab 11/11/24 1232 11/11/24 1336  NA 128* 130*  K 7.0* 5.9*  CL 106  --   GLUCOSE 181*  --   BUN >130*  --   CREATININE 14.00*  --    GFR: Estimated Creatinine Clearance: 3.5 mL/min (A) (by C-G formula based on SCr of 14 mg/dL (H)). Recent Labs  Lab 11/11/24 1230 11/11/24 1239 11/11/24 1437  WBC 20.7*  --   --   LATICACIDVEN  --  1.4 1.2    Liver Function Tests: No results for input(s): AST, ALT, ALKPHOS, BILITOT, PROT, ALBUMIN in the last 168 hours. No results for input(s): LIPASE, AMYLASE in the last 168 hours. No results for input(s): AMMONIA in the last 168 hours.  ABG    Component Value Date/Time   HCO3 8.7 (L) 11/11/2024 1336   TCO2 9 (L) 11/11/2024 1336   ACIDBASEDEF 18.0 (H) 11/11/2024 1336   O2SAT 77 11/11/2024 1336     Coagulation Profile: No results for input(s): INR, PROTIME in the last 168 hours.  Cardiac Enzymes: No results for input(s): CKTOTAL, CKMB, CKMBINDEX, TROPONINI in the last 168 hours.  HbA1C: Hgb A1c MFr Bld  Date/Time Value Ref Range Status  04/11/2007 03:20 AM (H)  Final   8.8 (NOTE)    The ADA recommends the following therapeutic goals for glycemic   control related to Hgb A1C measurement:   Goal of Therapy:   < 7.0% Hgb A1C   Action Suggested:  > 8.0% Hgb A1C   Ref:  Diabetes Care, 22, Suppl. 1, 1999  04/11/2007 03:20 AM (H)  Final   9.4 (NOTE)   The ADA recommends the following therapeutic goals for glycemic   control related to Hgb A1C measurement:   Goal of Therapy:   < 7.0% Hgb A1C   Action Suggested:  > 8.0% Hgb A1C   Ref:  Diabetes Care, 22, Suppl. 1, 1999    CBG: Recent Labs  Lab 11/11/24 1234  GLUCAP 187*    Review of Systems:  See HPI  Past Medical History:  He,  has a past medical history of CHF (congestive heart failure) (HCC), Coronary artery disease (2008), CVA (cerebral vascular accident) (HCC) (2008), Diabetes mellitus, Edema, Heart murmur, Hyperlipidemia, Hypertension, Long term current use of anticoagulant, Mitral insufficiency, Myocardial infarction, old (2008), and Obesity.   Surgical History:   Past Surgical History:  Procedure Laterality Date   CORONARY ANGIOPLASTY WITH STENT PLACEMENT  04/10/2007   with drug-eluting stent placement of the mid LAD   KNEE SURGERY       Social History:   reports that he quit smoking about 49 years ago. His smoking use included cigarettes. He has never used smokeless tobacco. He reports that he does not drink alcohol and does not use drugs.   Family History:  His family history includes Dementia in his mother; Heart Problems in his father. There is no history of Coronary artery disease or Congestive Heart Failure.   Allergies Allergies[1]   Home Medications  Prior to Admission medications  Medication Sig Start Date End Date Taking? Authorizing Provider  aspirin 81 MG tablet Take 81 mg by mouth daily.    [provider]  atorvastatin  (LIPITOR) 80 MG tablet Take 1 tablet (80 mg total) by mouth daily. 11/03/24   Jordan, Peter M, MD  carvedilol  (COREG ) 12.5 MG tablet Take 1 tablet (12.5 mg total) by  mouth 2 (two) times daily. 11/03/24 02/01/25  Jordan, Peter M, MD  dapagliflozin propanediol (FARXIGA) 5 MG TABS tablet Take 5 mg by mouth daily. Pt stated that he takes 1 tab a day    [provider]  furosemide  (LASIX ) 20 MG tablet Take 1 tablet (20 mg total) by mouth daily. 11/03/24   Jordan, Peter M, MD  losartan  (COZAAR ) 100 MG tablet Take 1 tablet (100 mg total) by mouth daily. 11/03/24   Jordan, Peter M, MD  Lutein-Zeaxanthin (OCUVITE LUTEIN 25 PO) Take 1 capsule by mouth daily.    [provider]  Multiple Vitamins-Minerals (CENTRUM SILVER PO) Take 1 tablet by mouth daily.     [provider]  nitroGLYCERIN  (NITROSTAT ) 0.4 MG SL tablet Place 1 tablet (0.4 mg total) under the tongue every 5 (five) minutes as needed for chest pain (x 3 doses). 11/03/24   Jordan, Peter M, MD  spironolactone  (ALDACTONE ) 25 MG tablet Take 1/2 tablet by mouth (12.5 mg) daily 11/03/24   Jordan, Peter M, MD     Critical care time: 45 minutes     Alfretta Pinch, PA-C [facility] For pager details, please see AMION or use Epic chat  After 1900, please call [facility] for cross coverage needs 11/11/2024, 3:21 PM      [1] No Known Allergies  "

## 2024-11-11 NOTE — Progress Notes (Signed)
 ED Pharmacy Antibiotic Sign Off An antibiotic consult was received from an ED provider for vanc/cefepime  per pharmacy dosing for sepsis. A chart review was completed to assess appropriateness.   The following one time order(s) were placed:  Vancomycin  1500mg  IV x1 Cefepime  2g IV x1  Further antibiotic and/or antibiotic pharmacy consults should be ordered by the admitting provider if indicated.   Thank you for allowing pharmacy to be a part of this patient's care.   Elma Fail, PharmD PGY1 Clinical Pharmacist [facility]  11/11/2024 1:34 PM

## 2024-11-11 NOTE — ED Notes (Signed)
 Foley catheter placement checked with US  by EDP before catheter balloon inflated d/t no urine return when foley was inserted.

## 2024-11-11 NOTE — Telephone Encounter (Signed)
 error

## 2024-11-12 ENCOUNTER — Inpatient Hospital Stay (HOSPITAL_COMMUNITY)

## 2024-11-12 DIAGNOSIS — E8721 Acute metabolic acidosis: Secondary | ICD-10-CM | POA: Diagnosis not present

## 2024-11-12 DIAGNOSIS — E875 Hyperkalemia: Secondary | ICD-10-CM | POA: Diagnosis not present

## 2024-11-12 DIAGNOSIS — R339 Retention of urine, unspecified: Secondary | ICD-10-CM | POA: Diagnosis not present

## 2024-11-12 DIAGNOSIS — R578 Other shock: Secondary | ICD-10-CM | POA: Diagnosis not present

## 2024-11-12 DIAGNOSIS — E119 Type 2 diabetes mellitus without complications: Secondary | ICD-10-CM | POA: Diagnosis not present

## 2024-11-12 DIAGNOSIS — R6521 Severe sepsis with septic shock: Secondary | ICD-10-CM

## 2024-11-12 DIAGNOSIS — N179 Acute kidney failure, unspecified: Secondary | ICD-10-CM | POA: Diagnosis not present

## 2024-11-12 DIAGNOSIS — N184 Chronic kidney disease, stage 4 (severe): Secondary | ICD-10-CM

## 2024-11-12 DIAGNOSIS — A419 Sepsis, unspecified organism: Secondary | ICD-10-CM | POA: Diagnosis not present

## 2024-11-12 LAB — BASIC METABOLIC PANEL WITH GFR
Anion gap: 15 (ref 5–15)
Anion gap: 16 — ABNORMAL HIGH (ref 5–15)
Anion gap: 17 — ABNORMAL HIGH (ref 5–15)
Anion gap: 18 — ABNORMAL HIGH (ref 5–15)
BUN: 141 mg/dL — ABNORMAL HIGH (ref 8–23)
BUN: 146 mg/dL — ABNORMAL HIGH (ref 8–23)
BUN: 161 mg/dL — ABNORMAL HIGH (ref 8–23)
BUN: 171 mg/dL — ABNORMAL HIGH (ref 8–23)
CO2: 14 mmol/L — ABNORMAL LOW (ref 22–32)
CO2: 15 mmol/L — ABNORMAL LOW (ref 22–32)
CO2: 18 mmol/L — ABNORMAL LOW (ref 22–32)
CO2: 20 mmol/L — ABNORMAL LOW (ref 22–32)
Calcium: 7.3 mg/dL — ABNORMAL LOW (ref 8.9–10.3)
Calcium: 7.3 mg/dL — ABNORMAL LOW (ref 8.9–10.3)
Calcium: 7.5 mg/dL — ABNORMAL LOW (ref 8.9–10.3)
Calcium: 7.7 mg/dL — ABNORMAL LOW (ref 8.9–10.3)
Chloride: 100 mmol/L (ref 98–111)
Chloride: 101 mmol/L (ref 98–111)
Chloride: 97 mmol/L — ABNORMAL LOW (ref 98–111)
Chloride: 98 mmol/L (ref 98–111)
Creatinine, Ser: 10.3 mg/dL — ABNORMAL HIGH (ref 0.61–1.24)
Creatinine, Ser: 8.11 mg/dL — ABNORMAL HIGH (ref 0.61–1.24)
Creatinine, Ser: 8.42 mg/dL — ABNORMAL HIGH (ref 0.61–1.24)
Creatinine, Ser: 9.7 mg/dL — ABNORMAL HIGH (ref 0.61–1.24)
GFR, Estimated: 5 mL/min — ABNORMAL LOW
GFR, Estimated: 5 mL/min — ABNORMAL LOW
GFR, Estimated: 6 mL/min — ABNORMAL LOW
GFR, Estimated: 6 mL/min — ABNORMAL LOW
Glucose, Bld: 155 mg/dL — ABNORMAL HIGH (ref 70–99)
Glucose, Bld: 167 mg/dL — ABNORMAL HIGH (ref 70–99)
Glucose, Bld: 219 mg/dL — ABNORMAL HIGH (ref 70–99)
Glucose, Bld: 231 mg/dL — ABNORMAL HIGH (ref 70–99)
Potassium: 3.7 mmol/L (ref 3.5–5.1)
Potassium: 3.7 mmol/L (ref 3.5–5.1)
Potassium: 4.1 mmol/L (ref 3.5–5.1)
Potassium: 5 mmol/L (ref 3.5–5.1)
Sodium: 132 mmol/L — ABNORMAL LOW (ref 135–145)
Sodium: 132 mmol/L — ABNORMAL LOW (ref 135–145)
Sodium: 132 mmol/L — ABNORMAL LOW (ref 135–145)
Sodium: 132 mmol/L — ABNORMAL LOW (ref 135–145)

## 2024-11-12 LAB — HEMOGLOBIN A1C
Hgb A1c MFr Bld: 6.4 % — ABNORMAL HIGH (ref 4.8–5.6)
Mean Plasma Glucose: 136.98 mg/dL

## 2024-11-12 LAB — ECHOCARDIOGRAM COMPLETE
Area-P 1/2: 3.89 cm2
Height: 65 in
S' Lateral: 3.7 cm
Weight: 2670.21 [oz_av]

## 2024-11-12 LAB — CBC
HCT: 22.1 % — ABNORMAL LOW (ref 39.0–52.0)
HCT: 20.9 % — ABNORMAL LOW (ref 39.0–52.0)
Hemoglobin: 7.5 g/dL — ABNORMAL LOW (ref 13.0–17.0)
Hemoglobin: 7.1 g/dL — ABNORMAL LOW (ref 13.0–17.0)
MCH: 32.8 pg (ref 26.0–34.0)
MCH: 32.7 pg (ref 26.0–34.0)
MCHC: 33.9 g/dL (ref 30.0–36.0)
MCHC: 34 g/dL (ref 30.0–36.0)
MCV: 96.5 fL (ref 80.0–100.0)
MCV: 96.3 fL (ref 80.0–100.0)
Platelets: 236 K/uL (ref 150–400)
Platelets: 220 K/uL (ref 150–400)
RBC: 2.29 MIL/uL — ABNORMAL LOW (ref 4.22–5.81)
RBC: 2.17 MIL/uL — ABNORMAL LOW (ref 4.22–5.81)
RDW: 16.6 % — ABNORMAL HIGH (ref 11.5–15.5)
RDW: 16.5 % — ABNORMAL HIGH (ref 11.5–15.5)
WBC: 13.5 K/uL — ABNORMAL HIGH (ref 4.0–10.5)
WBC: 12.4 K/uL — ABNORMAL HIGH (ref 4.0–10.5)
nRBC: 0 % (ref 0.0–0.2)
nRBC: 0 % (ref 0.0–0.2)

## 2024-11-12 LAB — GLUCOSE, CAPILLARY
Glucose-Capillary: 217 mg/dL — ABNORMAL HIGH (ref 70–99)
Glucose-Capillary: 205 mg/dL — ABNORMAL HIGH (ref 70–99)
Glucose-Capillary: 194 mg/dL — ABNORMAL HIGH (ref 70–99)
Glucose-Capillary: 147 mg/dL — ABNORMAL HIGH (ref 70–99)

## 2024-11-12 LAB — PHOSPHORUS: Phosphorus: 4 mg/dL (ref 2.5–4.6)

## 2024-11-12 LAB — MAGNESIUM: Magnesium: 1.9 mg/dL (ref 1.7–2.4)

## 2024-11-12 MED ORDER — SODIUM CHLORIDE 0.9 % IV SOLN
1.0000 g | INTRAVENOUS | Status: DC
  Administered 2024-11-12: 1 g via INTRAVENOUS
  Filled 2024-11-12 (×2): qty 10

## 2024-11-12 MED ORDER — MAGNESIUM SULFATE 2 GM/50ML IV SOLN
2.0000 g | Freq: Once | INTRAVENOUS | Status: AC
  Administered 2024-11-12: 2 g via INTRAVENOUS
  Filled 2024-11-12: qty 50

## 2024-11-12 MED ORDER — LACTATED RINGERS IV BOLUS
1000.0000 mL | Freq: Once | INTRAVENOUS | Status: AC
  Administered 2024-11-12: 1000 mL via INTRAVENOUS

## 2024-11-12 MED ORDER — STERILE WATER FOR INJECTION IV SOLN
INTRAVENOUS | Status: AC
  Filled 2024-11-12: qty 1000

## 2024-11-12 MED ORDER — AMIODARONE HCL IN DEXTROSE 360-4.14 MG/200ML-% IV SOLN
60.0000 mg/h | INTRAVENOUS | Status: DC
  Administered 2024-11-12 (×2): 60 mg/h via INTRAVENOUS
  Filled 2024-11-12 (×2): qty 200

## 2024-11-12 MED ORDER — NOREPINEPHRINE 4 MG/250ML-% IV SOLN
0.0000 ug/min | INTRAVENOUS | Status: DC
  Administered 2024-11-12: 10 ug/min via INTRAVENOUS
  Administered 2024-11-12: 6 ug/min via INTRAVENOUS
  Administered 2024-11-13: 2 ug/min via INTRAVENOUS
  Filled 2024-11-12 (×2): qty 250

## 2024-11-12 MED ORDER — VASOPRESSIN 20 UNITS/100 ML INFUSION FOR SHOCK
0.0000 [IU]/min | INTRAVENOUS | Status: DC
  Administered 2024-11-12 – 2024-11-13 (×2): 0.03 [IU]/min via INTRAVENOUS
  Filled 2024-11-12 (×2): qty 100

## 2024-11-12 MED ORDER — INSULIN ASPART 100 UNIT/ML IJ SOLN
0.0000 [IU] | Freq: Three times a day (TID) | INTRAMUSCULAR | Status: DC
  Administered 2024-11-12: 2 [IU] via SUBCUTANEOUS
  Administered 2024-11-12: 3 [IU] via SUBCUTANEOUS
  Administered 2024-11-13 (×2): 2 [IU] via SUBCUTANEOUS
  Administered 2024-11-13 – 2024-11-20 (×4): 1 [IU] via SUBCUTANEOUS
  Administered 2024-11-20: 2 [IU] via SUBCUTANEOUS
  Administered 2024-11-24: 1 [IU] via SUBCUTANEOUS
  Administered 2024-11-24 – 2024-11-26 (×3): 2 [IU] via SUBCUTANEOUS
  Administered 2024-11-26 – 2024-11-29 (×4): 1 [IU] via SUBCUTANEOUS
  Filled 2024-11-12: qty 2
  Filled 2024-11-12: qty 1
  Filled 2024-11-12: qty 2
  Filled 2024-11-12 (×5): qty 1
  Filled 2024-11-12: qty 2
  Filled 2024-11-12 (×2): qty 1
  Filled 2024-11-12: qty 2
  Filled 2024-11-12 (×2): qty 1
  Filled 2024-11-12: qty 2

## 2024-11-12 MED ORDER — AMIODARONE LOAD VIA INFUSION
150.0000 mg | Freq: Once | INTRAVENOUS | Status: AC
  Administered 2024-11-12: 150 mg via INTRAVENOUS
  Filled 2024-11-12: qty 83.34

## 2024-11-12 MED ORDER — AMIODARONE HCL IN DEXTROSE 360-4.14 MG/200ML-% IV SOLN
30.0000 mg/h | INTRAVENOUS | Status: DC
  Administered 2024-11-12 – 2024-11-14 (×3): 30 mg/h via INTRAVENOUS
  Filled 2024-11-12 (×3): qty 200

## 2024-11-12 NOTE — Progress Notes (Signed)
 Echocardiogram Attempted exam at 845am. Physician in room requested I come back later, as they were about to start a central line. Will attempt again later.  Juliene JINNY Rucks 11/12/2024, 8:47 AM

## 2024-11-12 NOTE — Progress Notes (Signed)
 "  NAME:  [name], MRN:  [id number], DOB:  [birth year], LOS: 1 ADMISSION DATE:  11/11/2024, CONSULTATION DATE:  11/11/24 REFERRING MD:  Dr. Dreama , CHIEF COMPLAINT:  acute renal failure, hypotension    History of Present Illness:  [name] is a 84 y.o. male with PMH of HTN, HLD, NSTEMI s/p DES to LAD (2008), ischemic cardiomyopathy, HFmrEF, and evidence of prior renal insufficiency who presents to the ED today with complains of generalized weakness and fatigue, lack of energy, and decreased appetite with poor PO intake since christmas (Dec 2025). Patient reports during this time he had noticed decreased urine output as well. Patient reports no prior history of this.  On arrival to the ED patient was found to be hypotensive 80/40s, started on levophed . CBC with leukocytosis and anemia with hgb 7-9. BMP with cr 14, BUN > 130, K+ 7.0 and received 1L NS bolus, calcium , insulin , D50, lokelma  with repeat K+ 5.9. Foley placed in ED, urinalysis with large leukocytes and received cefepime , vancomycin , flagyl  in the ED.    PCCM was consulted for shock  Pertinent  Medical History  has a past medical history of CHF (congestive heart failure) (HCC), Coronary artery disease (2008), CVA (cerebral vascular accident) (HCC) (2008), Diabetes mellitus, Edema, Heart murmur, Hyperlipidemia, Hypertension, Long term current use of anticoagulant, Mitral insufficiency, Myocardial infarction, old (2008), and Obesity.   Significant Hospital Events: Including procedures, antibiotic start and stop dates in addition to other pertinent events   1/14: admit to PCCM   Interim History / Subjective:  Saw pt at bedside. Denied pain in abdomen. Denied any acute complaints. Said he was resting fine.    Objective    Blood pressure (!) 110/95, pulse 90, temperature 98.1 F (36.7 C), temperature source Oral, resp. rate (!) 31, height 5' 5 (1.651 m), weight 75.7 kg, SpO2 97%.        Intake/Output Summary (Last 24  hours) at 11/12/2024 0957 Last data filed at 11/12/2024 0930 Gross per 24 hour  Intake 3234.26 ml  Output 1400 ml  Net 1834.26 ml   Filed Weights   11/11/24 1243 11/11/24 1845 11/12/24 0630  Weight: 72.6 kg 75.7 kg 75.7 kg    Examination: Constitutional:      Comments:  ill appearing, pale  HENT:     Head: Normocephalic and atraumatic.     Nose: Nose normal.  Cardiovascular:     Rate and Rhythm: Normal rate and regular rhythm.  Pulmonary:     Effort: Pulmonary effort is normal. No respiratory distress.     Breath sounds: Normal breath sounds. No wheezing.  Abdominal:     General: Bowel sounds are normal. There is no distension.     Palpations: Abdomen is soft.     Tenderness: There is no abdominal tenderness.     Comments: No suprapubic tenderness   Genitourinary:    Comments: foley Musculoskeletal:        Comments: No swelling in BL ankles   Skin:    General: Skin is warm and dry.  Neurological:     Mental Status: He is oriented to person, place, and time. Mental status is at baseline.   Resolved problem list   Assessment and Plan  Severe AKI on CKD 4 Shock 2/2 septic shock from UTI, hypovolemic due to poor intake Hyperkalemia secondary to renal failure  Metabolic Acidosis    Imaging showed loculated fluid around bladder with enlarged prostate and BL hydronephrosis. AKI likely due to obstructive  uropathy. BUN and Cr improving. Good urine output. Will get bladder scan to evaluate for urinary retention.  Increased pressure requirements with low BP. Currently on levophed  10 mcg. Will give a bolus of fluids. Plan for CVC today. No HD requirements at present,  will consider HD catheter if worsening kidney function.   Treatment for UTI as discussed below.  Potassium levels wnl. Will continue with bicarb for metabolic acidosis.    - CVC placement today - LR Bolus - Bladder scan results f/u   -D5 Sodium bicarb 44ml/hr -On Levophed  -Hold home farxiga, lasix ,  aldactone , losartan   -Foley placed on admission -BMP q4h -Nephrology consulted, appreciate recommendations     Urinary tract infection, POA -Imaging showed loculated fluid around bladder with enlarged prostate and BL hydronephrosis. -received cefepime  and flagyl  in ED 1/14; plan was to start Rocephin  2g on 1/15. Source of sepsis is likely urinary in nature. However, will continue with cefepime  for broad spectrum coverage for at least the next 24 hours to evaluate for other causes of infection.    Plan: -continue cefepime  for the next 24 hours  -f/u Urine culture and blood cultures    Acute on Chronic anemia Further drop in Hgb. Hgb baseline 11-12. B12 and folate levels not low. Fe/TIBC levels appropriate for likely undergoing infection. No concerns for GI bleed at present. Will monitor for any worsening and transfuse as need.    -CBC daily  -Transfuse if hgb     History of NSTEMI s/p DES to LAD (2008) History of ischemic cardiomyopathy History of HFmrEF Last echo 2022: EF 45-50%.    Plan:  -Holding home medications including carvedilol , lasix , losartan , aldactone  -Repeat echo ordered; f/u results     HTN  Increased pressor requirements as discussed above.  -holding home medications while on pressors and renal failure    DM CBGs are high. Will start on SSI.  -SSI   Labs   CBC: Recent Labs  Lab 11/11/24 1230 11/11/24 1232 11/11/24 1336 11/11/24 1643 11/12/24 0500  WBC 20.7*  --   --  21.8* 13.5*  NEUTROABS 18.9*  --   --   --   --   HGB 9.2* 8.8* 7.8* 7.7* 7.5*  HCT 28.7* 26.0* 23.0* 23.5* 22.1*  MCV 102.1*  --   --  97.9 96.5  PLT 276  --   --  232 236    Basic Metabolic Panel: Recent Labs  Lab 11/11/24 1425 11/11/24 1643 11/11/24 1923 11/11/24 2352 11/12/24 0500  NA 134* 134*  135 134* 132* 132*  K 6.0* 5.4*  5.2* 5.8* 5.0 4.1  CL 103 96*  95* 101 100 101  CO2 10* 21*  24 10* 14* 15*  GLUCOSE 179* 447*  568* 211* 231* 219*  BUN 194* 170*  163*  182* 171* 161*  CREATININE 11.60* 9.82*  9.37* 10.80* 10.30* 9.70*  CALCIUM  8.5* 7.1*  6.8* 8.0* 7.7* 7.5*  MG  --   --   --   --  1.9  PHOS  --   --   --   --  4.0   GFR: Estimated Creatinine Clearance: 5.6 mL/min (A) (by C-G formula based on SCr of 9.7 mg/dL (H)). Recent Labs  Lab 11/11/24 1230 11/11/24 1239 11/11/24 1437 11/11/24 1643 11/12/24 0500  WBC 20.7*  --   --  21.8* 13.5*  LATICACIDVEN  --  1.4 1.2  --   --     Liver Function Tests: Recent Labs  Lab 11/11/24  1425 11/11/24 1643  AST 19 19  ALT 20 18  ALKPHOS 78 62  BILITOT 0.3 0.2  PROT 5.7* 4.9*  ALBUMIN 2.5* 2.2*   No results for input(s): LIPASE, AMYLASE in the last 168 hours. No results for input(s): AMMONIA in the last 168 hours.  ABG    Component Value Date/Time   HCO3 8.7 (L) 11/11/2024 1336   TCO2 9 (L) 11/11/2024 1336   ACIDBASEDEF 18.0 (H) 11/11/2024 1336   O2SAT 77 11/11/2024 1336     Coagulation Profile: No results for input(s): INR, PROTIME in the last 168 hours.  Cardiac Enzymes: No results for input(s): CKTOTAL, CKMB, CKMBINDEX, TROPONINI in the last 168 hours.  HbA1C: Hgb A1c MFr Bld  Date/Time Value Ref Range Status  04/11/2007 03:20 AM (H)  Final   8.8 (NOTE)   The ADA recommends the following therapeutic goals for glycemic   control related to Hgb A1C measurement:   Goal of Therapy:   < 7.0% Hgb A1C   Action Suggested:  > 8.0% Hgb A1C   Ref:  Diabetes Care, 22, Suppl. 1, 1999  04/11/2007 03:20 AM (H)  Final   9.4 (NOTE)   The ADA recommends the following therapeutic goals for glycemic   control related to Hgb A1C measurement:   Goal of Therapy:   < 7.0% Hgb A1C   Action Suggested:  > 8.0% Hgb A1C   Ref:  Diabetes Care, 22, Suppl. 1, 1999    CBG: Recent Labs  Lab 11/11/24 1234 11/11/24 1841 11/12/24 0306  GLUCAP 187* 189* 217*    Review of Systems:   Per above  Past Medical History:  He,  has a past medical history of CHF (congestive heart  failure) (HCC), Coronary artery disease (2008), CVA (cerebral vascular accident) (HCC) (2008), Diabetes mellitus, Edema, Heart murmur, Hyperlipidemia, Hypertension, Long term current use of anticoagulant, Mitral insufficiency, Myocardial infarction, old (2008), and Obesity.   Surgical History:   Past Surgical History:  Procedure Laterality Date   CORONARY ANGIOPLASTY WITH STENT PLACEMENT  04/10/2007   with drug-eluting stent placement of the mid LAD   KNEE SURGERY       Social History:   reports that he quit smoking about 49 years ago. His smoking use included cigarettes. He has never used smokeless tobacco. He reports that he does not drink alcohol and does not use drugs.   Family History:  His family history includes Dementia in his mother; Heart Problems in his father. There is no history of Coronary artery disease or Congestive Heart Failure.   Allergies Allergies[1]   Home Medications  Prior to Admission medications  Medication Sig Start Date End Date Taking? Authorizing Provider  aspirin 81 MG tablet Take 81 mg by mouth at bedtime.   Yes [provider]  atorvastatin  (LIPITOR) 80 MG tablet Take 1 tablet (80 mg total) by mouth daily. Patient taking differently: Take 80 mg by mouth at bedtime. 11/03/24  Yes Jordan, Peter M, MD  carvedilol  (COREG ) 12.5 MG tablet Take 1 tablet (12.5 mg total) by mouth 2 (two) times daily. 11/03/24 02/01/25 Yes Jordan, Peter M, MD  dapagliflozin propanediol (FARXIGA) 5 MG TABS tablet Take 5 mg by mouth at bedtime.   Yes [provider]  furosemide  (LASIX ) 20 MG tablet Take 1 tablet (20 mg total) by mouth daily. 11/03/24  Yes Jordan, Peter M, MD  losartan  (COZAAR ) 100 MG tablet Take 1 tablet (100 mg total) by mouth daily. 11/03/24  Yes Jordan, Peter M,  MD  Lutein-Zeaxanthin (OCUVITE LUTEIN 25 PO) Take 1 capsule by mouth at bedtime.   Yes [provider]  Multiple Vitamins-Minerals (CENTRUM SILVER PO) Take 1 tablet by mouth daily.     Yes [provider]  nitroGLYCERIN  (NITROSTAT ) 0.4 MG SL tablet Place 1 tablet (0.4 mg total) under the tongue every 5 (five) minutes as needed for chest pain (x 3 doses). 11/03/24  Yes Jordan, Peter M, MD  spironolactone  (ALDACTONE ) 25 MG tablet Take 1/2 tablet by mouth (12.5 mg) daily 11/03/24  Yes Jordan, Peter M, MD     Critical care time:               [1] No Known Allergies  "

## 2024-11-12 NOTE — H&P (Deleted)
 "  NAME:  CORDARREL STIEFEL, MRN:  [id number], DOB:  05-23-1941, LOS: 1 ADMISSION DATE:  11/11/2024, CONSULTATION DATE:  11/11/24 REFERRING MD:  Dr. Dreama, CHIEF COMPLAINT:  acute renal failure, hypotension   History of Present Illness:  [name] is a 84 y.o. male with PMH of HTN, HLD, NSTEMI s/p DES to LAD (2008), ischemic cardiomyopathy, HFmrEF, and evidence of prior renal insufficiency who presents to the ED today with complains of generalized weakness and fatigue, lack of energy, and decreased appetite with poor PO intake since christmas (Dec 2025). Patient reports during this time he had noticed decreased urine output as well. Patient reports no prior history of this.  On arrival to the ED patient was found to be hypotensive 80/40s, started on levophed . CBC with leukocytosis and anemia with hgb 7-9. BMP with cr 14, BUN > 130, K+ 7.0 and received 1L NS bolus, calcium , insulin , D50, lokelma  with repeat K+ 5.9. Foley placed in ED, urinalysis with large leukocytes and received cefepime , vancomycin , flagyl  in the ED.   PCCM was consulted for shock Pertinent  Medical History   has a past medical history of CHF (congestive heart failure) (HCC), Coronary artery disease (2008), CVA (cerebral vascular accident) (HCC) (2008), Diabetes mellitus, Edema, Heart murmur, Hyperlipidemia, Hypertension, Long term current use of anticoagulant, Mitral insufficiency, Myocardial infarction, old (2008), and Obesity.   Significant Hospital Events: Including procedures, antibiotic start and stop dates in addition to other pertinent events   1/14: admit to PCCM   Interim History / Subjective:  Saw pt at bedside. Denied pain in abdomen. Denied any acute complaints. Said he was resting fine.    Objective    Blood pressure (!) 99/49, pulse 82, temperature 98.8 F (37.1 C), temperature source Oral, resp. rate 12, height 5' 5 (1.651 m), weight 75.7 kg, SpO2 97%.        Intake/Output Summary (Last 24 hours) at  11/12/2024 0706 Last data filed at 11/12/2024 0600 Gross per 24 hour  Intake 2957.35 ml  Output 1200 ml  Net 1757.35 ml   Filed Weights   11/11/24 1243 11/11/24 1845 11/12/24 0630  Weight: 72.6 kg 75.7 kg 75.7 kg    Examination: Physical Exam Vitals and nursing note reviewed.  Constitutional:      Comments:  ill appearing, pale  HENT:     Head: Normocephalic and atraumatic.     Nose: Nose normal.  Cardiovascular:     Rate and Rhythm: Normal rate and regular rhythm.  Pulmonary:     Effort: Pulmonary effort is normal. No respiratory distress.     Breath sounds: Normal breath sounds. No wheezing.  Abdominal:     General: Bowel sounds are normal. There is no distension.     Palpations: Abdomen is soft.     Tenderness: There is no abdominal tenderness.     Comments: No suprapubic tenderness   Genitourinary:    Comments: foley Musculoskeletal:        General: Swelling: bilateral ankles.     Comments: No swelling in BL ankles   Skin:    General: Skin is warm and dry.  Neurological:     Mental Status: He is oriented to person, place, and time. Mental status is at baseline.      Resolved problem list   Assessment and Plan   Severe AKI on CKD 4 Shock 2/2 septic shock from UTI, hypovolemic due to poor intake Hyperkalemia secondary to renal failure  Metabolic Acidosis   Imaging showed  loculated fluid around bladder with enlarged prostate and BL hydronephrosis. AKI likely due to obstructive uropathy. BUN and Cr improving. Good urine output. Will get bladder scan to evaluate for urinary retention.  Increased pressure requirements with low BP. Currently on levophed  10 mcg. Will give a bolus of fluids. Plan for CVC today. No HD requirements at present,  will consider HD catheter if worsening kidney function.   Treatment for UTI as discussed below.  Potassium levels wnl. Will continue with bicarb for metabolic acidosis.   - CVC placement today - LR Bolus - Bladder scan results  f/u   -D5 Sodium bicarb 42ml/hr -On Levophed  -Hold home farxiga, lasix , aldactone , losartan   -Foley placed on admission -BMP q4h -Nephrology consulted, appreciate recommendations    Urinary tract infection, POA -Imaging showed loculated fluid around bladder with enlarged prostate and BL hydronephrosis. -received cefepime  and flagyl  in ED 1/14; plan was to start Rocephin  2g on 1/15. Source of sepsis is likely urinary in nature. However, will continue with cefepime  for broad spectrum coverage for at least the next 24 hours to evaluate for other causes of infection.   Plan: -continue cefepime  for the next 24 hours  -f/u Urine culture and blood cultures   Acute on Chronic anemia Further drop in Hgb. Hgb baseline 11-12. B12 and folate levels not low. Fe/TIBC levels appropriate for likely undergoing infection. No concerns for GI bleed at present. Will monitor for any worsening and transfuse as need.   -CBC daily  -Transfuse if hgb    History of NSTEMI s/p DES to LAD (2008) History of ischemic cardiomyopathy History of HFmrEF Last echo 2022: EF 45-50%.   Plan:  -Holding home medications including carvedilol , lasix , losartan , aldactone  -Repeat echo ordered; f/u results    HTN  Increased pressor requirements as discussed above.  -holding home medications while on pressors and renal failure     Labs   CBC: Recent Labs  Lab 11/11/24 1230 11/11/24 1232 11/11/24 1336 11/11/24 1643 11/12/24 0500  WBC 20.7*  --   --  21.8* 13.5*  NEUTROABS 18.9*  --   --   --   --   HGB 9.2* 8.8* 7.8* 7.7* 7.5*  HCT 28.7* 26.0* 23.0* 23.5* 22.1*  MCV 102.1*  --   --  97.9 96.5  PLT 276  --   --  232 236    Basic Metabolic Panel: Recent Labs  Lab 11/11/24 1425 11/11/24 1643 11/11/24 1923 11/11/24 2352 11/12/24 0500  NA 134* 134*  135 134* 132* 132*  K 6.0* 5.4*  5.2* 5.8* 5.0 4.1  CL 103 96*  95* 101 100 101  CO2 10* 21*  24 10* 14* 15*  GLUCOSE 179* 447*  568* 211* 231*  219*  BUN 194* 170*  163* 182* 171* 161*  CREATININE 11.60* 9.82*  9.37* 10.80* 10.30* 9.70*  CALCIUM  8.5* 7.1*  6.8* 8.0* 7.7* 7.5*  MG  --   --   --   --  1.9  PHOS  --   --   --   --  4.0   GFR: Estimated Creatinine Clearance: 5.6 mL/min (A) (by C-G formula based on SCr of 9.7 mg/dL (H)). Recent Labs  Lab 11/11/24 1230 11/11/24 1239 11/11/24 1437 11/11/24 1643 11/12/24 0500  WBC 20.7*  --   --  21.8* 13.5*  LATICACIDVEN  --  1.4 1.2  --   --     Liver Function Tests: Recent Labs  Lab 11/11/24 1425 11/11/24 1643  AST 19 19  ALT 20 18  ALKPHOS 78 62  BILITOT 0.3 0.2  PROT 5.7* 4.9*  ALBUMIN 2.5* 2.2*   No results for input(s): LIPASE, AMYLASE in the last 168 hours. No results for input(s): AMMONIA in the last 168 hours.  ABG    Component Value Date/Time   HCO3 8.7 (L) 11/11/2024 1336   TCO2 9 (L) 11/11/2024 1336   ACIDBASEDEF 18.0 (H) 11/11/2024 1336   O2SAT 77 11/11/2024 1336     Coagulation Profile: No results for input(s): INR, PROTIME in the last 168 hours.  Cardiac Enzymes: No results for input(s): CKTOTAL, CKMB, CKMBINDEX, TROPONINI in the last 168 hours.  HbA1C: Hgb A1c MFr Bld  Date/Time Value Ref Range Status  04/11/2007 03:20 AM (H)  Final   8.8 (NOTE)   The ADA recommends the following therapeutic goals for glycemic   control related to Hgb A1C measurement:   Goal of Therapy:   < 7.0% Hgb A1C   Action Suggested:  > 8.0% Hgb A1C   Ref:  Diabetes Care, 22, Suppl. 1, 1999  04/11/2007 03:20 AM (H)  Final   9.4 (NOTE)   The ADA recommends the following therapeutic goals for glycemic   control related to Hgb A1C measurement:   Goal of Therapy:   < 7.0% Hgb A1C   Action Suggested:  > 8.0% Hgb A1C   Ref:  Diabetes Care, 22, Suppl. 1, 1999    CBG: Recent Labs  Lab 11/11/24 1234 11/11/24 1841 11/12/24 0306  GLUCAP 187* 189* 217*    Review of Systems:   See HPI  Past Medical History:  He,  has a past medical history of  CHF (congestive heart failure) (HCC), Coronary artery disease (2008), CVA (cerebral vascular accident) (HCC) (2008), Diabetes mellitus, Edema, Heart murmur, Hyperlipidemia, Hypertension, Long term current use of anticoagulant, Mitral insufficiency, Myocardial infarction, old (2008), and Obesity.   Surgical History:   Past Surgical History:  Procedure Laterality Date   CORONARY ANGIOPLASTY WITH STENT PLACEMENT  04/10/2007   with drug-eluting stent placement of the mid LAD   KNEE SURGERY       Social History:   reports that he quit smoking about 49 years ago. His smoking use included cigarettes. He has never used smokeless tobacco. He reports that he does not drink alcohol and does not use drugs.   Family History:  His family history includes Dementia in his mother; Heart Problems in his father. There is no history of Coronary artery disease or Congestive Heart Failure.   Allergies Allergies[1]   Home Medications  Prior to Admission medications  Medication Sig Start Date End Date Taking? Authorizing Provider  aspirin 81 MG tablet Take 81 mg by mouth daily.    [provider]  atorvastatin  (LIPITOR) 80 MG tablet Take 1 tablet (80 mg total) by mouth daily. 11/03/24   Jordan, Peter M, MD  carvedilol  (COREG ) 12.5 MG tablet Take 1 tablet (12.5 mg total) by mouth 2 (two) times daily. 11/03/24 02/01/25  Jordan, Peter M, MD  dapagliflozin propanediol (FARXIGA) 5 MG TABS tablet Take 5 mg by mouth daily. Pt stated that he takes 1 tab a day    [provider]  furosemide  (LASIX ) 20 MG tablet Take 1 tablet (20 mg total) by mouth daily. 11/03/24   Jordan, Peter M, MD  losartan  (COZAAR ) 100 MG tablet Take 1 tablet (100 mg total) by mouth daily. 11/03/24   Jordan, Peter M, MD  Lutein-Zeaxanthin (OCUVITE LUTEIN 25 PO)  Take 1 capsule by mouth daily.    [provider]  Multiple Vitamins-Minerals (CENTRUM SILVER PO) Take 1 tablet by mouth daily.     [provider]  nitroGLYCERIN   (NITROSTAT ) 0.4 MG SL tablet Place 1 tablet (0.4 mg total) under the tongue every 5 (five) minutes as needed for chest pain (x 3 doses). 11/03/24   Jordan, Peter M, MD  spironolactone  (ALDACTONE ) 25 MG tablet Take 1/2 tablet by mouth (12.5 mg) daily 11/03/24   Jordan, Peter M, MD     Critical care time: 45 minutes     Peyton Crozier, PA-C [facility] For pager details, please see AMION or use Epic chat  After 1900, please call [facility] for cross coverage needs 11/12/2024, 7:06 AM       [1] No Known Allergies  "

## 2024-11-12 NOTE — Plan of Care (Signed)
" °  Problem: Clinical Measurements: Goal: Ability to maintain clinical measurements within normal limits will improve Outcome: Progressing   Problem: Clinical Measurements: Goal: Will remain free from infection Outcome: Progressing   Problem: Clinical Measurements: Goal: Diagnostic test results will improve Outcome: Progressing   Problem: Clinical Measurements: Goal: Cardiovascular complication will be avoided Outcome: Progressing   Plan of care, monitoring, assessment, treatment, and intervention (s) ongoing, see MAR see flowsheet "

## 2024-11-12 NOTE — Procedures (Signed)
 Central Venous Catheter Insertion Procedure Note  [name]  [id number]  Nov 07, 1940  Date:11/12/24  Time:5:57 PM   Provider Performing:Josslyn Ciolek Slater Staff MD  Procedure: Insertion of Non-tunneled Central Venous Catheter(36556) with US  guidance (23062)   Indication(s) Medication administration  Consent Risks of the procedure as well as the alternatives and risks of each were explained to the patient and/or caregiver.  Consent for the procedure was obtained and is signed in the bedside chart  Anesthesia Topical only with 1% lidocaine   Timeout Verified patient identification, verified procedure, site/side was marked, verified correct patient position, special equipment/implants available, medications/allergies/relevant history reviewed, required imaging and test results available.  Sterile Technique Maximal sterile technique including full sterile barrier drape, hand hygiene, sterile gown, sterile gloves, mask, hair covering, sterile ultrasound probe cover (if used).  Procedure Description Area of catheter insertion was cleaned with chlorhexidine  and draped in sterile fashion.  With real-time ultrasound guidance a central venous catheter was placed into the left internal jugular vein. Nonpulsatile blood flow and easy flushing noted in all ports.  The catheter was sutured in place and sterile dressing applied.  Complications/Tolerance None; patient tolerated the procedure well. Chest X-ray is ordered to verify placement for internal jugular or subclavian cannulation.   Chest x-ray is not ordered for femoral cannulation.  EBL Minimal  Specimen(s) None

## 2024-11-12 NOTE — Progress Notes (Signed)
 Admit: 11/11/2024 LOS: 1  61M with severe AKI (baseline GFR unclear), severe life threatening hyperkalmeia, metabolic acidosis.   Subjective:  Has remained on norepinephrine  overnight for septic shock Potassium has trended to normal BUN and creatinine are slowly improving 1.2 L urine output recorded but has bloody and purulent urine still  01/14 0701 - 01/15 0700 In: 2957.4 [I.V.:1947.3; IV Piggyback:1000.1] Out: 1200 [Urine:1200]  Filed Weights   11/11/24 1243 11/11/24 1845 11/12/24 0630  Weight: 72.6 kg 75.7 kg 75.7 kg    Scheduled Meds:  Chlorhexidine  Gluconate Cloth  6 each Topical Daily   heparin   5,000 Units Subcutaneous Q8H   insulin  aspart  0-9 Units Subcutaneous TID WC   Continuous Infusions:  sodium chloride  Stopped (11/12/24 0741)   ceFEPime  (MAXIPIME ) IV     norepinephrine  (LEVOPHED ) Adult infusion 8 mcg/min (11/12/24 0800)   sodium bicarbonate  150 mEq in dextrose  5 % 1,150 mL infusion 75 mL/hr at 11/12/24 0800   sodium bicarbonate  150 mEq in sterile water  1,150 mL infusion 1,150 mL/hr at 11/12/24 0941   PRN Meds:.acetaminophen , polyethylene glycol, senna  Current Labs: reviewed  Blood and urine cultures from admission are pending  Physical Exam:  Blood pressure (!) 108/51, pulse 76, temperature 98.1 F (36.7 C), temperature source Oral, resp. rate 13, height 5' 5 (1.651 m), weight 75.7 kg, SpO2 97%. GEN: Elderly male awake, NAD ENT: NCAT EYES: EOMI CV: Regular, no murmur or rub PULM: Clear throughout, normal work of breathing ABD: Soft nontender  SKIN: No significant lower extremity edema EXT: No LEE  A Severe AKI likely from obstructive uropathy; slowly improving but remains severe Hyperkalemia, severe, life threatening from obstructive uropathy using ARB and MRB; CKD: Resolved Metabolic Acidosis, lactate WNL, likely due to #1; improving and remains on bicarbonate drip Hemorrhagic and purulent UTI with underlying progressive BPH and BOO; cefepime  per  CCM, blood and urine cultures pending Hypotension / shock, likely from septic shock/hypovolemia; remains on pressors Leukocytosis, WBC 21 at presentation, improved today Macrocytic anemia  P Continue supportive care No need for dialysis Discussed periodic flushing of Foley catheter with nursing to make sure no clots obstructing flow of urine Can start on tamsulosin once blood pressures improved Medication Issues; Preferred narcotic agents for pain control are hydromorphone, fentanyl, and methadone. Morphine should not be used.  Baclofen should be avoided Avoid oral sodium phosphate and magnesium  citrate based laxatives / bowel preps    Bernardino Gasman MD 11/12/2024, 10:26 AM  Recent Labs  Lab 11/11/24 1923 11/11/24 2352 11/12/24 0500  NA 134* 132* 132*  K 5.8* 5.0 4.1  CL 101 100 101  CO2 10* 14* 15*  GLUCOSE 211* 231* 219*  BUN 182* 171* 161*  CREATININE 10.80* 10.30* 9.70*  CALCIUM  8.0* 7.7* 7.5*  PHOS  --   --  4.0   Recent Labs  Lab 11/11/24 1230 11/11/24 1232 11/11/24 1336 11/11/24 1643 11/12/24 0500  WBC 20.7*  --   --  21.8* 13.5*  NEUTROABS 18.9*  --   --   --   --   HGB 9.2*   < > 7.8* 7.7* 7.5*  HCT 28.7*   < > 23.0* 23.5* 22.1*  MCV 102.1*  --   --  97.9 96.5  PLT 276  --   --  232 236   < > = values in this interval not displayed.

## 2024-11-12 NOTE — Progress Notes (Signed)
 Echocardiogram 2D Echocardiogram has been performed.  Juliene JINNY Rucks 11/12/2024, 11:59 AM

## 2024-11-12 NOTE — Plan of Care (Signed)
" °  Problem: Education: Goal: Knowledge of General Education information will improve Description: Including pain rating scale, medication(s)/side effects and non-pharmacologic comfort measures Outcome: Progressing   Problem: Health Behavior/Discharge Planning: Goal: Ability to manage health-related needs will improve Outcome: Progressing   Problem: Clinical Measurements: Goal: Ability to maintain clinical measurements within normal limits will improve Outcome: Progressing Goal: Will remain free from infection Outcome: Progressing Goal: Diagnostic test results will improve Outcome: Progressing Goal: Respiratory complications will improve Outcome: Progressing Goal: Cardiovascular complication will be avoided Outcome: Progressing   Problem: Activity: Goal: Risk for activity intolerance will decrease Outcome: Progressing   Problem: Nutrition: Goal: Adequate nutrition will be maintained Outcome: Progressing   Problem: Coping: Goal: Level of anxiety will decrease Outcome: Progressing   Problem: Elimination: Goal: Will not experience complications related to bowel motility Outcome: Progressing Goal: Will not experience complications related to urinary retention Outcome: Progressing   Problem: Pain Managment: Goal: General experience of comfort will improve and/or be controlled Outcome: Progressing   Problem: Skin Integrity: Goal: Risk for impaired skin integrity will decrease Outcome: Progressing   Problem: Metabolic: Goal: Ability to maintain appropriate glucose levels will improve Outcome: Progressing   Problem: Nutritional: Goal: Maintenance of adequate nutrition will improve Outcome: Progressing Goal: Progress toward achieving an optimal weight will improve Outcome: Progressing   Problem: Skin Integrity: Goal: Risk for impaired skin integrity will decrease Outcome: Progressing   Problem: Tissue Perfusion: Goal: Adequacy of tissue perfusion will  improve Outcome: Progressing   "

## 2024-11-12 NOTE — Progress Notes (Signed)
 Chest x-ray demonstrated CVC in the right axillary vein. Patient and family advised that the central line would need to be removed and a new left central venous catheter would need to be placed. All questions were answered. Left CVC to follow.

## 2024-11-12 NOTE — Procedures (Signed)
 Central Venous Catheter Insertion Procedure Note  [name]  [id number]  [birth year]  Date:11/12/24  Time:3:53 PM   Provider Performing:Phuc Kluttz   Procedure: Insertion of Non-tunneled Central Venous [phone]) with US  guidance (23062)   Indication(s) Medication administration  Consent Risks of the procedure as well as the alternatives and risks of each were explained to the patient and/or caregiver.  Consent for the procedure was obtained and is signed in the bedside chart  Anesthesia Topical only with 1% lidocaine   Timeout Verified patient identification, verified procedure, site/side was marked, verified correct patient position, special equipment/implants available, medications/allergies/relevant history reviewed, required imaging and test results available.  Sterile Technique Maximal sterile technique including full sterile barrier drape, hand hygiene, sterile gown, sterile gloves, mask, hair covering, sterile ultrasound probe cover (if used).  Procedure Description Area of catheter insertion was cleaned with chlorhexidine  and draped in sterile fashion.  With real-time ultrasound guidance a central venous catheter was placed into the right internal jugular vein. Nonpulsatile blood flow and easy flushing noted in all ports.  The catheter was sutured in place and sterile dressing applied.  Complications/Tolerance None; patient tolerated the procedure well. Stat chest x-ray ordered to confirm location.  EBL Minimal  Specimen(s) None

## 2024-11-12 NOTE — Progress Notes (Addendum)
 eLink Physician-Brief Progress Note Patient Name: [name] DOB: October 30, 1940 MRN: [id number]   Date of Service  11/12/2024  HPI/Events of Note  Notified of bleeding from the coude catheter.  Pt has heparin  for DVT prophylaxis due tonight.   eICU Interventions  Ok to hold heparin  dose tonight.      Intervention Category Intermediate Interventions: Bleeding - evaluation and treatment with blood products  Shanda Busman 11/12/2024, 8:10 PM  5:39 AM Hgb down to 6.9 from 7.1.  BP 96/49, MAP 62, HR 72, RR 22, O2 sats 94%  Plan> Transfuse 1 unit pRBC.  Hold heparin .

## 2024-11-13 ENCOUNTER — Other Ambulatory Visit: Payer: Self-pay

## 2024-11-13 DIAGNOSIS — D631 Anemia in chronic kidney disease: Secondary | ICD-10-CM | POA: Diagnosis not present

## 2024-11-13 DIAGNOSIS — E1122 Type 2 diabetes mellitus with diabetic chronic kidney disease: Secondary | ICD-10-CM

## 2024-11-13 DIAGNOSIS — E872 Acidosis, unspecified: Secondary | ICD-10-CM | POA: Diagnosis not present

## 2024-11-13 DIAGNOSIS — R6521 Severe sepsis with septic shock: Secondary | ICD-10-CM | POA: Diagnosis not present

## 2024-11-13 DIAGNOSIS — I959 Hypotension, unspecified: Secondary | ICD-10-CM

## 2024-11-13 DIAGNOSIS — A419 Sepsis, unspecified organism: Secondary | ICD-10-CM

## 2024-11-13 DIAGNOSIS — E861 Hypovolemia: Secondary | ICD-10-CM

## 2024-11-13 DIAGNOSIS — E1165 Type 2 diabetes mellitus with hyperglycemia: Secondary | ICD-10-CM | POA: Diagnosis not present

## 2024-11-13 DIAGNOSIS — I129 Hypertensive chronic kidney disease with stage 1 through stage 4 chronic kidney disease, or unspecified chronic kidney disease: Secondary | ICD-10-CM

## 2024-11-13 DIAGNOSIS — N184 Chronic kidney disease, stage 4 (severe): Secondary | ICD-10-CM | POA: Diagnosis not present

## 2024-11-13 DIAGNOSIS — L8990 Pressure ulcer of unspecified site, unspecified stage: Secondary | ICD-10-CM | POA: Insufficient documentation

## 2024-11-13 DIAGNOSIS — R339 Retention of urine, unspecified: Secondary | ICD-10-CM

## 2024-11-13 DIAGNOSIS — N390 Urinary tract infection, site not specified: Secondary | ICD-10-CM | POA: Diagnosis not present

## 2024-11-13 DIAGNOSIS — N179 Acute kidney failure, unspecified: Secondary | ICD-10-CM | POA: Diagnosis not present

## 2024-11-13 LAB — BASIC METABOLIC PANEL WITH GFR
Anion gap: 14 (ref 5–15)
Anion gap: 15 (ref 5–15)
Anion gap: 15 (ref 5–15)
Anion gap: 15 (ref 5–15)
BUN: 140 mg/dL — ABNORMAL HIGH (ref 8–23)
BUN: 136 mg/dL — ABNORMAL HIGH (ref 8–23)
BUN: 132 mg/dL — ABNORMAL HIGH (ref 8–23)
BUN: 131 mg/dL — ABNORMAL HIGH (ref 8–23)
CO2: 21 mmol/L — ABNORMAL LOW (ref 22–32)
CO2: 21 mmol/L — ABNORMAL LOW (ref 22–32)
CO2: 21 mmol/L — ABNORMAL LOW (ref 22–32)
CO2: 22 mmol/L (ref 22–32)
Calcium: 7.3 mg/dL — ABNORMAL LOW (ref 8.9–10.3)
Calcium: 7.2 mg/dL — ABNORMAL LOW (ref 8.9–10.3)
Calcium: 7.3 mg/dL — ABNORMAL LOW (ref 8.9–10.3)
Calcium: 7.4 mg/dL — ABNORMAL LOW (ref 8.9–10.3)
Chloride: 96 mmol/L — ABNORMAL LOW (ref 98–111)
Chloride: 96 mmol/L — ABNORMAL LOW (ref 98–111)
Chloride: 94 mmol/L — ABNORMAL LOW (ref 98–111)
Chloride: 95 mmol/L — ABNORMAL LOW (ref 98–111)
Creatinine, Ser: 8.06 mg/dL — ABNORMAL HIGH (ref 0.61–1.24)
Creatinine, Ser: 7.98 mg/dL — ABNORMAL HIGH (ref 0.61–1.24)
Creatinine, Ser: 7.61 mg/dL — ABNORMAL HIGH (ref 0.61–1.24)
Creatinine, Ser: 7.55 mg/dL — ABNORMAL HIGH (ref 0.61–1.24)
GFR, Estimated: 6 mL/min — ABNORMAL LOW
GFR, Estimated: 6 mL/min — ABNORMAL LOW
GFR, Estimated: 7 mL/min — ABNORMAL LOW
GFR, Estimated: 7 mL/min — ABNORMAL LOW
Glucose, Bld: 163 mg/dL — ABNORMAL HIGH (ref 70–99)
Glucose, Bld: 165 mg/dL — ABNORMAL HIGH (ref 70–99)
Glucose, Bld: 154 mg/dL — ABNORMAL HIGH (ref 70–99)
Glucose, Bld: 126 mg/dL — ABNORMAL HIGH (ref 70–99)
Potassium: 3.8 mmol/L (ref 3.5–5.1)
Potassium: 3.6 mmol/L (ref 3.5–5.1)
Potassium: 3.6 mmol/L (ref 3.5–5.1)
Potassium: 3.6 mmol/L (ref 3.5–5.1)
Sodium: 131 mmol/L — ABNORMAL LOW (ref 135–145)
Sodium: 132 mmol/L — ABNORMAL LOW (ref 135–145)
Sodium: 130 mmol/L — ABNORMAL LOW (ref 135–145)
Sodium: 132 mmol/L — ABNORMAL LOW (ref 135–145)

## 2024-11-13 LAB — GLUCOSE, CAPILLARY
Glucose-Capillary: 165 mg/dL — ABNORMAL HIGH (ref 70–99)
Glucose-Capillary: 161 mg/dL — ABNORMAL HIGH (ref 70–99)
Glucose-Capillary: 141 mg/dL — ABNORMAL HIGH (ref 70–99)
Glucose-Capillary: 117 mg/dL — ABNORMAL HIGH (ref 70–99)

## 2024-11-13 LAB — HEMOGLOBIN AND HEMATOCRIT, BLOOD
HCT: 21.7 % — ABNORMAL LOW (ref 39.0–52.0)
Hemoglobin: 7.5 g/dL — ABNORMAL LOW (ref 13.0–17.0)

## 2024-11-13 LAB — CBC
HCT: 20 % — ABNORMAL LOW (ref 39.0–52.0)
Hemoglobin: 6.9 g/dL — CL (ref 13.0–17.0)
MCH: 33 pg (ref 26.0–34.0)
MCHC: 34.5 g/dL (ref 30.0–36.0)
MCV: 95.7 fL (ref 80.0–100.0)
Platelets: 193 K/uL (ref 150–400)
RBC: 2.09 MIL/uL — ABNORMAL LOW (ref 4.22–5.81)
RDW: 16.6 % — ABNORMAL HIGH (ref 11.5–15.5)
WBC: 13.9 K/uL — ABNORMAL HIGH (ref 4.0–10.5)
nRBC: 0 % (ref 0.0–0.2)

## 2024-11-13 LAB — ABO/RH: ABO/RH(D): A NEG

## 2024-11-13 LAB — URINE CULTURE: Culture: NO GROWTH

## 2024-11-13 LAB — MAGNESIUM: Magnesium: 2.1 mg/dL (ref 1.7–2.4)

## 2024-11-13 LAB — PREPARE RBC (CROSSMATCH)

## 2024-11-13 LAB — PHOSPHORUS: Phosphorus: 4 mg/dL (ref 2.5–4.6)

## 2024-11-13 MED ORDER — SODIUM CHLORIDE 0.9 % IV SOLN
1.0000 g | INTRAVENOUS | Status: AC
  Administered 2024-11-13 – 2024-11-17 (×5): 1 g via INTRAVENOUS
  Filled 2024-11-13 (×5): qty 10

## 2024-11-13 MED ORDER — RENA-VITE PO TABS
1.0000 | ORAL_TABLET | Freq: Every day | ORAL | Status: DC
  Administered 2024-11-20 – 2024-12-01 (×12): 1 via ORAL
  Filled 2024-11-13 (×14): qty 1

## 2024-11-13 MED ORDER — ENSURE PLUS HIGH PROTEIN PO LIQD
237.0000 mL | Freq: Two times a day (BID) | ORAL | Status: DC
  Administered 2024-11-14 – 2024-11-16 (×4): 237 mL via ORAL

## 2024-11-13 MED ORDER — SODIUM CHLORIDE 0.9% IV SOLUTION: Freq: Once | INTRAVENOUS | Status: AC

## 2024-11-13 NOTE — Progress Notes (Signed)
 Initial Nutrition Assessment  DOCUMENTATION CODES:  Non-severe (moderate) malnutrition in context of acute illness/injury  INTERVENTION:  Renal Multivitamin w/ minerals daily Liberalize diet to regular due to ongoing poor PO intake and malnutrition  Encourage PO intake Ensure Plus High Protein po BID, each supplement provides 350 kcal and 20 grams of protein Recommend placement of Cortrak and initiation of enteral nutrition if within patients GOC: Osmolite 1.5 at 45 mL/hr (1080 mL per day) Start at 20 mL/hr, advance by 10 mL every 8 hours to goal rate of 45 mL/hr. 60 mL ProSource TF20 - Daily Regimen at goal provides 1700 calories, 88 gm protein, and 823 mL free water  daily.  NUTRITION DIAGNOSIS:  Moderate Malnutrition related to acute illness as evidenced by mild fat depletion, mild muscle depletion.  GOAL:  Patient will meet greater than or equal to 90% of their needs  MONITOR:  PO intake, Labs, Weight trends, I & O's  REASON FOR ASSESSMENT:  Consult Assessment of nutrition requirement/status  ASSESSMENT:  84 y.o. male presented to the ED with weakness, fatigue, and decreased appetite/PO. PMH includes CVA, CHF, HLD, HTN, DM, and CAD. Pt admitted with severe AKI on CKD 4, shock, and UTI.   1/14 - Admitted  Met with patient and family in room. Reports that his PO intake was very poor prior to admission with only bites of meals during the day. Was eating foods such as eggs, yogurt, and Keifer. Daughter had fixed Christmas dinner and patient did not eat as much or foods that they know that he really enjoys. He was drinking some protein shakes at home attempting to increase his protein intake. Since being at the hospital, patient with minimal to no PO intake. Also reporting significant nausea with the smell of food. Denies any emesis. Reports that he has no desire to eat and that he is just not interested in eating.  RD discussed enteral nutrition with patient and family given  prolonged poor PO intake and no desire to eat. Family and patient wanted additional time to think about. MD notified by RD of discussion with patient regarding placement of Cortrak.   Family report that they have noticed weight loss. Some was slightly intentional prior to admission as son reports limiting sodium intake to help with fluid retention given known heart failure. They endorse some weight loss has just occurred with decreased PO intake. Patient typically ambulated with a cane at home but was getting weaker prior to admission. Limited weight history within chart from the past year. Patient with recent outpatient visit and has had a 3.9 kg or 5% weight loss within the past week. Unable to confirm if true weight loss versus fluid loss.   Admission Weight: 72.6 kg Current Weight: 73.6 kg  Patient with edema present on all extremities. Edema may be masking depletions of fat and muscle depletions during physical exam.   Nutrition Related Medications: Novolog  0--9 units TID Drips Ceftriaxone  Levophed  Vasopressin  Labs: Sodium 132, Potassium 3.6, BUN 136, Creatinine 7.98, Phosphorus 4.0, Magnesium  2.1, Hgb A1c 6.4 CBG: 147-205 mg/dL x 24 hrs   UOP: 8119 mL x 24 hrs   NUTRITION - FOCUSED PHYSICAL EXAM: Flowsheet Row Most Recent Value  Orbital Region Mild depletion  Upper Arm Region Mild depletion  Thoracic and Lumbar Region No depletion  Buccal Region Mild depletion  Temple Region No depletion  Clavicle Bone Region Mild depletion  Clavicle and Acromion Bone Region Mild depletion  Scapular Bone Region Mild depletion  Dorsal Hand No depletion  [  Edema]  Patellar Region No depletion  Anterior Thigh Region No depletion  Posterior Calf Region No depletion  Edema (RD Assessment) Moderate  Hair Reviewed  Eyes Reviewed  Mouth Reviewed  Skin Reviewed  Nails Reviewed   Diet Order:   Diet Order             Diet regular Room service appropriate? Yes with Assist; Fluid consistency:  Thin; Fluid restriction: 1200 mL Fluid  Diet effective now                  EDUCATION NEEDS: Education needs have been addressed  Skin:  Skin Assessment: Reviewed RN Assessment (DTI: head, back; Stage 2: Sacrum)  Last BM:  1/14  Height:  Ht Readings from Last 1 Encounters:  11/11/24 5' 5 (1.651 m)   Weight:  Wt Readings from Last 1 Encounters:  11/13/24 73.6 kg   Ideal Body Weight:  61.8 kg  BMI:  Body mass index is 27 kg/m.  Estimated Nutritional Needs:  Kcal:  1650-1850 Protein:  80-100 grams Fluid:  >/= 1.5 L   Nestora Glatter RD, LDN Registered Dietitian I Please see AMION for contact information

## 2024-11-13 NOTE — Procedures (Signed)
" ° °  Procedure: insert indwelling catheter bedside   Urology Procedure Note:  84 year old male with bladder outlet obstruction, remarkably thickened bladder wall, severe bilateral hydronephrosis, with gross hematuria following traumatic catheter insertion.  Patient was resting comfortably on arrival and after Case and plan was reviewed, patient provided consent for Foley catheter exchange and initiation of CBI.  Please see separate consult note.  Existing 42F was removed without difficulty.  Patient was prepped and draped in the usual sterile fashion.  10 cc of viscous lidocaine  lubricant was injected directly into the urethra.  After adequate time for anesthetic effect had been provided, a 67F three-way hematuria coud was advanced to the level of the bladder without significant resistance.  This was relatively well-tolerated by the patient and there was an immediate return of lightly blood-tinged urine.  30 mL of sterile water  were placed in the retention balloon.  Next I hand irrigated a few 100 cc of sterile water , pulling out a number of small older clots.  He did not appear to have a significant amount of active bleeding, if any at all.  Catheter was placed to gravity drainage and CBI was started at slow gtt.  This concluded the procedure.  Nursing to titrate CBI to light pink irrigant.   Ole Bourdon, NP [facility] Pager: [phone]  "

## 2024-11-13 NOTE — Progress Notes (Signed)
" °  Progress Note   Date: 11/13/2024  Patient Name: [name]        MRN#: [id number]  Review the patients clinical findings supports the diagnosis of:   Diabetes mellitus type 2 uncontrolled with hyperglycemia     "

## 2024-11-13 NOTE — Progress Notes (Addendum)
 "  NAME:  [name], MRN:  [id number], DOB:  03/03/1941, LOS: 2 ADMISSION DATE:  11/11/2024, CONSULTATION DATE:  11/11/24 REFERRING MD:  Dr. Dreama , CHIEF COMPLAINT:  acute renal failure, hypotension    History of Present Illness:  [name] is a 84 y.o. male with PMH of HTN, HLD, NSTEMI s/p DES to LAD (2008), ischemic cardiomyopathy, HFmrEF, and evidence of prior renal insufficiency who presents to the ED today with complains of generalized weakness and fatigue, lack of energy, and decreased appetite with poor PO intake since christmas (Dec 2025). Patient reports during this time he had noticed decreased urine output as well. Patient reports no prior history of this.  On arrival to the ED patient was found to be hypotensive 80/40s, started on levophed . CBC with leukocytosis and anemia with hgb 7-9. BMP with cr 14, BUN > 130, K+ 7.0 and received 1L NS bolus, calcium , insulin , D50, lokelma  with repeat K+ 5.9. Foley placed in ED, urinalysis with large leukocytes and received cefepime , vancomycin , flagyl  in the ED.    PCCM was consulted for shock  Pertinent  Medical History  has a past medical history of CHF (congestive heart failure) (HCC), Coronary artery disease (2008), CVA (cerebral vascular accident) (HCC) (2008), Diabetes mellitus, Edema, Heart murmur, Hyperlipidemia, Hypertension, Long term current use of anticoagulant, Mitral insufficiency, Myocardial infarction, old (2008), and Obesity.   Significant Hospital Events: Including procedures, antibiotic start and stop dates in addition to other pertinent events   1/14: admit to [facility]  1/15: CVC placement   Interim History / Subjective:  Saw pt at bedside. Denies abdominal pain, suprapubic pain, CP, dizziness. No acute complaints from the pt. Family concerned about low appetite and poor PO intake.   Objective    Blood pressure (!) 100/45, pulse 74, temperature 97.8 F (36.6 C), temperature source Oral, resp. rate 15, height 5' 5  (1.651 m), weight 73.6 kg, SpO2 96%.        Intake/Output Summary (Last 24 hours) at 11/13/2024 0805 Last data filed at 11/13/2024 0700 Gross per 24 hour  Intake 6247 ml  Output 1880 ml  Net 4367 ml   Filed Weights   11/11/24 1845 11/12/24 0630 11/13/24 0437  Weight: 75.7 kg 75.7 kg 73.6 kg    Examination: Constitutional:      Comments:   pale, resting comfortably in bed  HENT:     Head: Normocephalic and atraumatic.     Nose: Nose normal.  Cardiovascular:     Rate and Rhythm: Normal rate and regular rhythm.  Pulmonary:     Effort: Pulmonary effort is normal. No respiratory distress.     Breath sounds: Normal breath sounds. No wheezing.  Abdominal:     General: Bowel sounds are normal. There is no distension.     Palpations: Abdomen is soft.     Tenderness: There is no abdominal tenderness.     Comments: No suprapubic tenderness   Genitourinary:    Comments: foley with bloody OP Musculoskeletal:        Comments: No swelling in BL ankles   Skin:    General: Skin is warm and dry.  Neurological:     Mental Status: alert, at baseline   Resolved problem list   Assessment and Plan  Severe AKI on CKD 4 Shock 2/2 septic shock from UTI, hypovolemic due to poor intake Hyperkalemia secondary to renal failure, resolved  Metabolic Acidosis, improving    Imaging on admission showed loculated fluid around bladder  with enlarged prostate and BL hydronephrosis. AKI likely due to obstructive uropathy. BUN and Cr improving. Good urine output, however bloody despite regular flushes by RN. No indication for dialysis today. Consulted Urology for further evaluation of hematuria.   Increased pressure requirements with low BP on admission. CVC was required for the same. CVC was repositioned to L side due to improper initial placement on 1/15. Pressor requirements have further increased despite 2 L bolus yesterday with the addition of vasopressin . BP this AM low with MAPS< 65. Low BP likely due  to poor PO intake. Will consult RD for nutritional requirements.   Treatment for UTI as discussed below.  Potassium levels wnl.    - CVC placement 1/15 -On Levophed  and vasopressin  -Urology consulted, appreciate recommendations   -RD consult placed, appreciate recommendations   -Hold home farxiga, lasix , aldactone , losartan   -Foley placed on admission -Nephrology consulted, appreciate recommendations     Urinary tract infection, POA -Imaging showed loculated fluid around bladder with enlarged prostate and BL hydronephrosis.  Treated with cefepime . Urine cx were negative. Will deescalate to CTX.    Plan: -start CTX today  -Urine Cx negative  -Bcx NGTD  Afib Developed afib with RVR yesterday and was started on amiodarone . Likely due to acute condition. Rates between 67-78 this AM. Telemetry still shows irregular rhythm. Will continue with amiodarone  for at least 24 hrs and reassess.  -Continue amiodarone     Acute on Chronic anemia Further drop in Hgb today that required transfusion. Hgb baseline 11-12. B12 and folate levels not low. Fe/TIBC levels appropriate for likely undergoing infection. No concerns for GI bleed at present. Acute drop in hgb likely due to hematuria. Nephrology has been consulted per above. Drop in hgb could also be dilutional from fluid resuscitation yesterday. Will monitor for any worsening and transfuse as need.    -Undergoing transfusion at present  -f/u H&H after transfusion  -CBC daily  -Transfuse if hgb  <7   History of NSTEMI s/p DES to LAD (2008) History of ischemic cardiomyopathy History of HFmrEF Echo 2022: EF 45-50%. Repeat echo showed EF: 55-60 %, improved with regional wall motion abnormalities.     Plan:  -Holding home medications including carvedilol , lasix , losartan , aldactone    Hx of HTN  Increased pressor requirements as discussed above.  -holding home medications while on pressors and renal failure    DM CBGs are appropriate.  On  SSI.  -Continue SSI   Labs   CBC: Recent Labs  Lab 11/11/24 1230 11/11/24 1232 11/11/24 1336 11/11/24 1643 11/12/24 0500 11/12/24 1813 11/13/24 0432  WBC 20.7*  --   --  21.8* 13.5* 12.4* 13.9*  NEUTROABS 18.9*  --   --   --   --   --   --   HGB 9.2*   < > 7.8* 7.7* 7.5* 7.1* 6.9*  HCT 28.7*   < > 23.0* 23.5* 22.1* 20.9* 20.0*  MCV 102.1*  --   --  97.9 96.5 96.3 95.7  PLT 276  --   --  232 236 220 193   < > = values in this interval not displayed.    Basic Metabolic Panel: Recent Labs  Lab 11/12/24 0500 11/12/24 1813 11/12/24 2142 11/13/24 0037 11/13/24 0432  NA 132* 132* 132* 131* 132*  K 4.1 3.7 3.7 3.8 3.6  CL 101 98 97* 96* 96*  CO2 15* 18* 20* 21* 21*  GLUCOSE 219* 167* 155* 163* 165*  BUN 161* 146* 141* 140* 136*  CREATININE 9.70* 8.42* 8.11* 8.06* 7.98*  CALCIUM  7.5* 7.3* 7.3* 7.3* 7.2*  MG 1.9  --   --   --  2.1  PHOS 4.0  --   --   --  4.0   GFR: Estimated Creatinine Clearance: 6.2 mL/min (A) (by C-G formula based on SCr of 7.98 mg/dL (H)). Recent Labs  Lab 11/11/24 1239 11/11/24 1437 11/11/24 1643 11/12/24 0500 11/12/24 1813 11/13/24 0432  WBC  --   --  21.8* 13.5* 12.4* 13.9*  LATICACIDVEN 1.4 1.2  --   --   --   --     Liver Function Tests: Recent Labs  Lab 11/11/24 1425 11/11/24 1643  AST 19 19  ALT 20 18  ALKPHOS 78 62  BILITOT 0.3 0.2  PROT 5.7* 4.9*  ALBUMIN 2.5* 2.2*   No results for input(s): LIPASE, AMYLASE in the last 168 hours. No results for input(s): AMMONIA in the last 168 hours.  ABG    Component Value Date/Time   HCO3 8.7 (L) 11/11/2024 1336   TCO2 9 (L) 11/11/2024 1336   ACIDBASEDEF 18.0 (H) 11/11/2024 1336   O2SAT 77 11/11/2024 1336     Coagulation Profile: No results for input(s): INR, PROTIME in the last 168 hours.  Cardiac Enzymes: No results for input(s): CKTOTAL, CKMB, CKMBINDEX, TROPONINI in the last 168 hours.  HbA1C: Hgb A1c MFr Bld  Date/Time Value Ref Range Status   11/12/2024 05:00 AM 6.4 (H) 4.8 - 5.6 % Final    Comment:    (NOTE) Diagnosis of Diabetes The following HbA1c ranges recommended by the American Diabetes Association (ADA) may be used as an aid in the diagnosis of diabetes mellitus.  Hemoglobin             Suggested A1C NGSP%              Diagnosis  <5.7                   Non Diabetic  5.7-6.4                Pre-Diabetic  >6.4                   Diabetic  <7.0                   Glycemic control for                       adults with diabetes.    04/11/2007 03:20 AM (H)  Final   8.8 (NOTE)   The ADA recommends the following therapeutic goals for glycemic   control related to Hgb A1C measurement:   Goal of Therapy:   < 7.0% Hgb A1C   Action Suggested:  > 8.0% Hgb A1C   Ref:  Diabetes Care, 22, Suppl. 1, 1999  04/11/2007 03:20 AM (H)  Final   9.4 (NOTE)   The ADA recommends the following therapeutic goals for glycemic   control related to Hgb A1C measurement:   Goal of Therapy:   < 7.0% Hgb A1C   Action Suggested:  > 8.0% Hgb A1C   Ref:  Diabetes Care, 22, Suppl. 1, 1999    CBG: Recent Labs  Lab 11/12/24 0306 11/12/24 1156 11/12/24 1605 11/12/24 2152 11/13/24 0743  GLUCAP 217* 205* 194* 147* 165*    Review of Systems:   Per above  Past Medical History:  He,  has a past medical history of CHF (  congestive heart failure) (HCC), Coronary artery disease (2008), CVA (cerebral vascular accident) (HCC) (2008), Diabetes mellitus, Edema, Heart murmur, Hyperlipidemia, Hypertension, Long term current use of anticoagulant, Mitral insufficiency, Myocardial infarction, old (2008), and Obesity.   Surgical History:   Past Surgical History:  Procedure Laterality Date   CORONARY ANGIOPLASTY WITH STENT PLACEMENT  04/10/2007   with drug-eluting stent placement of the mid LAD   KNEE SURGERY       Social History:   reports that he quit smoking about 49 years ago. His smoking use included cigarettes. He has never used smokeless tobacco.  He reports that he does not drink alcohol and does not use drugs.   Family History:  His family history includes Dementia in his mother; Heart Problems in his father. There is no history of Coronary artery disease or Congestive Heart Failure.   Allergies Allergies[1]   Home Medications  Prior to Admission medications  Medication Sig Start Date End Date Taking? Authorizing Provider  aspirin  81 MG tablet Take 81 mg by mouth at bedtime.   Yes [provider]  atorvastatin  (LIPITOR) 80 MG tablet Take 1 tablet (80 mg total) by mouth daily. Patient taking differently: Take 80 mg by mouth at bedtime. 11/03/24  Yes Jordan, Peter M, MD  carvedilol  (COREG ) 12.5 MG tablet Take 1 tablet (12.5 mg total) by mouth 2 (two) times daily. 11/03/24 02/01/25 Yes Jordan, Peter M, MD  dapagliflozin propanediol (FARXIGA) 5 MG TABS tablet Take 5 mg by mouth at bedtime.   Yes [provider]  furosemide  (LASIX ) 20 MG tablet Take 1 tablet (20 mg total) by mouth daily. 11/03/24  Yes Jordan, Peter M, MD  losartan  (COZAAR ) 100 MG tablet Take 1 tablet (100 mg total) by mouth daily. 11/03/24  Yes Jordan, Peter M, MD  Lutein-Zeaxanthin (OCUVITE LUTEIN 25 PO) Take 1 capsule by mouth at bedtime.   Yes [provider]  Multiple Vitamins-Minerals (CENTRUM SILVER PO) Take 1 tablet by mouth daily.    Yes [provider]  nitroGLYCERIN  (NITROSTAT ) 0.4 MG SL tablet Place 1 tablet (0.4 mg total) under the tongue every 5 (five) minutes as needed for chest pain (x 3 doses). 11/03/24  Yes Jordan, Peter M, MD  spironolactone  (ALDACTONE ) 25 MG tablet Take 1/2 tablet by mouth (12.5 mg) daily 11/03/24  Yes Jordan, Peter M, MD     Critical care time:                [1] No Known Allergies  "

## 2024-11-13 NOTE — Progress Notes (Signed)
 Admit: 11/11/2024 LOS: 2  68M with severe AKI (baseline GFR unclear), severe life threatening hyperkalmeia, metabolic acidosis.   Subjective:  Remains on norepinephrine   Potassium remains normal BUN and creatinine are slowly improving 1.9 L urine output recorded, remains bloody, regularly flushed catheter by RN  01/15 0701 - 01/16 0700 In: 6513.9 [I.V.:3222; IV Piggyback:3151.9] Out: 1880 [Urine:1880]  Filed Weights   11/11/24 1845 11/12/24 0630 11/13/24 0437  Weight: 75.7 kg 75.7 kg 73.6 kg    Scheduled Meds:  Chlorhexidine  Gluconate Cloth  6 each Topical Daily   insulin  aspart  0-9 Units Subcutaneous TID WC   Continuous Infusions:  amiodarone  30 mg/hr (11/13/24 1000)   cefTRIAXone  (ROCEPHIN )  IV     norepinephrine  (LEVOPHED ) Adult infusion 3 mcg/min (11/13/24 1027)   vasopressin  0.02 Units/min (11/13/24 1027)   PRN Meds:.acetaminophen , polyethylene glycol, senna  Current Labs: reviewed  Blood and urine cultures from admission are pending  Physical Exam:  Blood pressure 114/65, pulse 69, temperature 98 F (36.7 C), temperature source Oral, resp. rate 11, height 5' 5 (1.651 m), weight 73.6 kg, SpO2 96%. GEN: Elderly male awake, NAD ENT: NCAT EYES: EOMI CV: Regular, no murmur or rub PULM: Clear throughout, normal work of breathing ABD: Soft nontender  SKIN: No significant lower extremity edema EXT: No LEE  A Severe AKI likely from obstructive uropathy; slowly improving; has not req RRT Hyperkalemia, severe, life threatening from obstructive uropathy using ARB and MRB; CKD: Resolved Metabolic Acidosis, lactate WNL, likely due to #1; improving Hemorrhagic and purulent UTI with underlying progressive BPH and BOO; ceftriaxone  per CCM, blood and urine cultures pending Hypotension / shock, likely from septic shock/hypovolemia; remains on pressors Leukocytosis, WBC 21 at presentation, improved  Macrocytic anemia, worsened from presentation for PRBC today  P Continue  supportive care No need for dialysis If bloody urine persists might want to consider urology input regarding CBI Can start on tamsulosin once blood pressures improved Medication Issues; Preferred narcotic agents for pain control are hydromorphone, fentanyl , and methadone. Morphine should not be used.  Baclofen should be avoided Avoid oral sodium phosphate and magnesium  citrate based laxatives / bowel preps    Bernardino Gasman MD 11/13/2024, 10:54 AM  Recent Labs  Lab 11/12/24 0500 11/12/24 1813 11/12/24 2142 11/13/24 0037 11/13/24 0432  NA 132*   < > 132* 131* 132*  K 4.1   < > 3.7 3.8 3.6  CL 101   < > 97* 96* 96*  CO2 15*   < > 20* 21* 21*  GLUCOSE 219*   < > 155* 163* 165*  BUN 161*   < > 141* 140* 136*  CREATININE 9.70*   < > 8.11* 8.06* 7.98*  CALCIUM  7.5*   < > 7.3* 7.3* 7.2*  PHOS 4.0  --   --   --  4.0   < > = values in this interval not displayed.   Recent Labs  Lab 11/11/24 1230 11/11/24 1232 11/12/24 0500 11/12/24 1813 11/13/24 0432  WBC 20.7*   < > 13.5* 12.4* 13.9*  NEUTROABS 18.9*  --   --   --   --   HGB 9.2*   < > 7.5* 7.1* 6.9*  HCT 28.7*   < > 22.1* 20.9* 20.0*  MCV 102.1*   < > 96.5 96.3 95.7  PLT 276   < > 236 220 193   < > = values in this interval not displayed.

## 2024-11-13 NOTE — Plan of Care (Signed)
" °  Problem: Education: Goal: Knowledge of General Education information will improve Description: Including pain rating scale, medication(s)/side effects and non-pharmacologic comfort measures Outcome: Not Progressing   Problem: Health Behavior/Discharge Planning: Goal: Ability to manage health-related needs will improve Outcome: Not Progressing   Problem: Clinical Measurements: Goal: Ability to maintain clinical measurements within normal limits will improve Outcome: Not Progressing Goal: Will remain free from infection Outcome: Not Progressing Goal: Diagnostic test results will improve Outcome: Not Progressing Goal: Respiratory complications will improve Outcome: Not Progressing Goal: Cardiovascular complication will be avoided Outcome: Not Progressing   Problem: Activity: Goal: Risk for activity intolerance will decrease Outcome: Not Progressing   Problem: Nutrition: Goal: Adequate nutrition will be maintained Outcome: Not Progressing   Problem: Coping: Goal: Level of anxiety will decrease Outcome: Not Progressing   Problem: Elimination: Goal: Will not experience complications related to bowel motility Outcome: Not Progressing Goal: Will not experience complications related to urinary retention Outcome: Not Progressing   Problem: Pain Managment: Goal: General experience of comfort will improve and/or be controlled Outcome: Not Progressing   Problem: Skin Integrity: Goal: Risk for impaired skin integrity will decrease Outcome: Not Progressing   Problem: Education: Goal: Ability to describe self-care measures that may prevent or decrease complications (Diabetes Survival Skills Education) will improve Outcome: Not Progressing Goal: Individualized Educational Video(s) Outcome: Not Progressing   Problem: Coping: Goal: Ability to adjust to condition or change in health will improve Outcome: Not Progressing   Problem: Fluid Volume: Goal: Ability to maintain a  balanced intake and output will improve Outcome: Not Progressing   Problem: Health Behavior/Discharge Planning: Goal: Ability to identify and utilize available resources and services will improve Outcome: Not Progressing Goal: Ability to manage health-related needs will improve Outcome: Not Progressing   Problem: Metabolic: Goal: Ability to maintain appropriate glucose levels will improve Outcome: Not Progressing   Problem: Nutritional: Goal: Maintenance of adequate nutrition will improve Outcome: Not Progressing Goal: Progress toward achieving an optimal weight will improve Outcome: Not Progressing   Problem: Skin Integrity: Goal: Risk for impaired skin integrity will decrease Outcome: Not Progressing   Problem: Tissue Perfusion: Goal: Adequacy of tissue perfusion will improve Outcome: Not Progressing   "

## 2024-11-13 NOTE — Consult Note (Signed)
 "  Urology Consult Note   Requesting Attending Physician:  Gatha Pence, MD Service Providing Consult: Urology  Consulting Attending: Dr. Watt   Reason for Consult: Gross hematuria  HPI: [name] is seen in consultation for reasons noted above at the request of Gatha Pence, MD. [facility] significant for HTN, HLD, CVA, mitral valve insufficiency, NSTEMI s/p DES to LAD in 2008, ischemic cardiomyopathy, HFmrEF, and renal insufficiency.  Patient is a 84 y.o. male presenting for generalized weakness, fatigue, decreased appetite, and poor intake since Christmas of 2025.  Patient reported decreased urine output.  In [facility] emergency department he was found to be hypertensive, anemic, hyperkalemic and urinalysis was suggestive of infection.  Patient was admitted to the ICU for vasopressor support, cardiac monitoring, and general critical care.  During this time he had difficult catheter placement, with eventual return of purulent urine and scattered blood clots.  Patient has continued to experience hematuria while in the ICU.  On my arrival patient was resting in bed, alert, oriented, and in no distress.  We reviewed the case and plan and he was amenable to proceeding with catheter exchange and continuous bladder irrigation if necessary.  Please see separate procedure note ------------------  Assessment:   84 y.o. male with gross hematuria following traumatic Foley insertion   Recommendations: # Gross hematuria # Anemia # ARF # BPH  Trend labs.  Interval improvement in serum creatinine from 14.0-->8.0.  No development for the last 3 days.  Enlarged prostate with bladder wall thickening and trabeculation on CT imaging.  Will collect another renal ultrasound tomorrow morning to see if there is any improvement.  Will make consider attempting bilateral stents versus percutaneous nephrostomy tubes at that time.  Difficult to tell patient's baseline renal function but is probably had some  component of bladder outlet obstruction from his prostate for some time.  Bladder wall thickening.  Questionable fluid collection at dome of bladder very odd.  I have seen abscess formation within the bladder wall along with other occasion, though this does not appear to be the case.  Few bacteria on urinalysis and no growth on urine culture. Patient would benefit from repeat CT imaging to assess kidneys as well as bladder wall.  Would recommend repeat CT A/P after 1 week IV ABX.  Initial catheter was placed appropriately on CT imaging.  Exchanged for 108f three-way hematuria coud placed and clot material hand irrigated.  CBI started on low gtt.  Titrate to pink lemonade color.  Expect that he will recover without surgical intervention.  Failing CBI, will consider taking him to the OR for cystoscopy with clot evacuation and fulguration.  Nursing to hand irrigate as needed  Trend H&H.  Patient was significantly anemic on arrival and from my understanding his gross hematuria did not begin until after catheter placement.  His hematuria was not that bad on my arrival and I would not attribute this to acute blood loss from a urinary source.  Nephrology following for ARF  Will follow  Case and plan discussed with Dr. Watt  Past Medical History: Past Medical History:  Diagnosis Date   CHF (congestive heart failure) (HCC)    past echo in 2010 showed an EF of 50 to 55% with mild AS and mild mitral insufficiency   Coronary artery disease 2008   Prior anterior MI (with syncope as the initial presentation) with single vessel LAD occlusive artherosclerotic CAD -- post stent of the mid LAD   CVA (cerebral vascular accident) (  HCC) 2008   at the time of his MI   Diabetes mellitus    Edema    Heart murmur    Hyperlipidemia    Hypertension    Long term current use of anticoagulant    previously on coumadin therapy.    Mitral insufficiency    Myocardial infarction, old 2008   Obesity     Past  Surgical History:  Past Surgical History:  Procedure Laterality Date   CORONARY ANGIOPLASTY WITH STENT PLACEMENT  04/10/2007   with drug-eluting stent placement of the mid LAD   KNEE SURGERY      Medication: Current Facility-Administered Medications  Medication Dose Route Frequency Provider Last Rate Last Admin   acetaminophen  (TYLENOL ) tablet 650 mg  650 mg Oral Q4H PRN Crozier, Peyton, PA-C   650 mg at 11/12/24 1202   amiodarone  (NEXTERONE  PREMIX) 360-4.14 MG/200ML-% (1.8 mg/mL) IV infusion  30 mg/hr Intravenous Continuous Syeda, Raeeha, DO 16.67 mL/hr at 11/13/24 1100 30 mg/hr at 11/13/24 1100   cefTRIAXone  (ROCEPHIN ) 1 g in sodium chloride  0.9 % 100 mL IVPB  1 g Intravenous Q24H Syeda, Raeeha, DO       Chlorhexidine  Gluconate Cloth 2 % PADS 6 each  6 each Topical Daily Crozier, Peyton, PA-C   6 each at 11/12/24 0940   insulin  aspart (novoLOG ) injection 0-9 Units  0-9 Units Subcutaneous TID WC Kassie Acquanetta Bradley, MD   2 Units at 11/13/24 0751   norepinephrine  (LEVOPHED ) 4mg  in (0.016 mg/mL) premix infusion  0-40 mcg/min Intravenous Titrated Kassie Acquanetta Bradley, MD 7.5 mL/hr at 11/13/24 1102 2 mcg/min at 11/13/24 1102   polyethylene glycol (MIRALAX  / GLYCOLAX ) packet 17 g  17 g Oral Daily PRN Hussein, Abdullahi, MD       senna (SENOKOT) tablet 8.6 mg  1 tablet Oral BID PRN Sharie Bourbon, MD       vasopressin  (PITRESSIN) 20 Units in 100 mL (0.2 unit/mL) infusion-*FOR SHOCK*  0-0.03 Units/min Intravenous Continuous Syeda, Raeeha, DO 6 mL/hr at 11/13/24 1100 0.02 Units/min at 11/13/24 1100    Allergies: Allergies[1]  Social History: Social History[2]  Family History Family History  Problem Relation Age of Onset   Dementia Mother    Heart Problems Father    Coronary artery disease Neg Hx    Congestive Heart Failure Neg Hx     Review of Systems  Genitourinary:  Positive for hematuria. Negative for dysuria, flank pain, frequency and urgency.     Objective   Vital signs  in last 24 hours: BP (!) 106/58   Pulse 62   Temp 99 F (37.2 C) (Oral)   Resp 14   Ht 5' 5 (1.651 m)   Wt 73.6 kg   SpO2 95%   BMI 27.00 kg/m   Physical Exam General: A&O, resting, appropriate HEENT: [place]/AT Pulmonary: Normal work of breathing Cardiovascular: no cyanosis GU: 52f three-way hematuria coud in place with CBI on low gtt.  Light pink irrigant   Most Recent Labs: Lab Results  Component Value Date   WBC 13.9 (H) 11/13/2024   HGB 6.9 (LL) 11/13/2024   HCT 20.0 (L) 11/13/2024   PLT 193 11/13/2024    Lab Results  Component Value Date   NA 132 (L) 11/13/2024   K 3.6 11/13/2024   CL 96 (L) 11/13/2024   CO2 21 (L) 11/13/2024   BUN 136 (H) 11/13/2024   CREATININE 7.98 (H) 11/13/2024   CALCIUM  7.2 (L) 11/13/2024   MG 2.1 11/13/2024   PHOS  4.0 11/13/2024    Lab Results  Component Value Date   INR 1.9 (H) 04/17/2007     Urine Culture: @[id number] (laburin,org,[id number],[id number])@   IMAGING: DG CHEST PORT 1 VIEW Result Date: 11/12/2024 CLINICAL DATA:  Central line placement. EXAM: PORTABLE CHEST 1 VIEW COMPARISON:  Earlier film, same date. FINDINGS: The right sided catheter has been removed. The left IJ catheter tip is in the region of the brachiocephalic SVC junction. No complicating features are identified. Stable heart and lungs. No acute findings. IMPRESSION: Left IJ catheter tip in the region of the brachiocephalic SVC junction. No complicating features. Electronically Signed   By: MYRTIS Stammer M.D.   On: 11/12/2024 18:29   DG CHEST PORT 1 VIEW Result Date: 11/12/2024 EXAM: 1 VIEW(S) XRAY OF THE CHEST 11/12/2024 04:30:00 PM COMPARISON: 11/11/2024 CLINICAL HISTORY: Encounter for central line placement [id number] FINDINGS: LINES, TUBES AND DEVICES: Right neck central line in place with tip within the region of the right axillary vein in the right axilla. LUNGS AND PLEURA: Mild left basilar atelectasis. No pleural effusion. No pneumothorax. HEART AND MEDIASTINUM: Aortic  arch calcifications. Coronary stent noted. No acute abnormality of the cardiac and mediastinal silhouettes. BONES AND SOFT TISSUES: No acute osseous abnormality. IMPRESSION: 1. Right IJ approach central line tip within the region of the right axillary vein. Repositioning recommended. No pneumothorax. These results will be called to the ordering clinician or representative by the radiologist assistant and communication documented in the pacs or clario dashboard. Electronically signed by: Rogelia Myers MD 11/12/2024 04:44 PM EST RP Workstation: [id number]   ECHOCARDIOGRAM COMPLETE Result Date: 11/12/2024    ECHOCARDIOGRAM REPORT   Patient Name:   JAMEIL WHITMOYER [facility] Date of Exam: 11/12/2024 Medical Rec #:  [id number]      Height:       65.0 in Accession #:    [id number]     Weight:       166.9 lb Date of Birth:  1941-07-31       BSA:          1.832 m Patient Age:    84 years       BP:           87/64 mmHg Patient Gender: M              HR:           138 bpm. Exam Location:  Inpatient Procedure: 2D Echo, Color Doppler and Cardiac Doppler (Both Spectral and Color            Flow Doppler were utilized during procedure). Indications:    Shock  History:        Patient has prior history of Echocardiogram examinations, most                 recent 07/13/2021. CHF, CAD and Previous Myocardial Infarction,                 Signs/Symptoms:Edema and Murmur; Risk Factors:Hypertension,                 Diabetes, Dyslipidemia and Former Smoker.  Sonographer:    Juliene Rucks Referring Phys: [id number] PEYTON CROZIER  Sonographer Comments: Image acquisition challenging due to respiratory motion. IMPRESSIONS  1. Apical akinesis with overall preserved LV function.  2. Left ventricular ejection fraction, by estimation, is 55 to 60%. The left ventricle has normal function. The left ventricle demonstrates regional wall motion abnormalities (see scoring diagram/findings for description). Left ventricular diastolic parameters are indeterminate.  3.  Right ventricular systolic function is normal. The right ventricular size is normal.  4. The mitral valve is normal in structure. No evidence of mitral valve regurgitation. No evidence of mitral stenosis.  5. The aortic valve has an indeterminant number of cusps. Aortic valve regurgitation is not visualized. No aortic stenosis is present. FINDINGS  Left Ventricle: Left ventricular ejection fraction, by estimation, is 55 to 60%. The left ventricle has normal function. The left ventricle demonstrates regional wall motion abnormalities. The left ventricular internal cavity size was normal in size. There is no left ventricular hypertrophy. Left ventricular diastolic parameters are indeterminate. Right Ventricle: The right ventricular size is normal. Right ventricular systolic function is normal. Left Atrium: Left atrial size was normal in size. Right Atrium: Right atrial size was normal in size. Pericardium: There is no evidence of pericardial effusion. Mitral Valve: The mitral valve is normal in structure. Mild mitral annular calcification. No evidence of mitral valve regurgitation. No evidence of mitral valve stenosis. Tricuspid Valve: The tricuspid valve is normal in structure. Tricuspid valve regurgitation is trivial. No evidence of tricuspid stenosis. Aortic Valve: The aortic valve has an indeterminant number of cusps. Aortic valve regurgitation is not visualized. No aortic stenosis is present. Pulmonic Valve: The pulmonic valve was normal in structure. Pulmonic valve regurgitation is not visualized. No evidence of pulmonic stenosis. Aorta: The aortic root is normal in size and structure. Venous: The inferior vena cava was not well visualized. IAS/Shunts: The interatrial septum was not well visualized. Additional Comments: Apical akinesis with overall preserved LV function.  LEFT VENTRICLE PLAX 2D LVIDd:         4.70 cm   Diastology LVIDs:         3.70 cm   LV e' medial:    8.70 cm/s LV PW:         1.10 cm   LV  E/e' medial:  10.1 LV IVS:        0.90 cm   LV e' lateral:   15.10 cm/s LVOT diam:     2.20 cm   LV E/e' lateral: 5.8 LV SV:         81 LV SV Index:   44 LVOT Area:     3.80 cm  RIGHT VENTRICLE RV Basal diam:  2.80 cm RV Mid diam:    2.50 cm TAPSE (M-mode): 1.4 cm LEFT ATRIUM             Index LA Vol (A2C):   57.9 ml 31.61 ml/m LA Vol (A4C):   51.6 ml 28.17 ml/m LA Biplane Vol: 54.8 ml 29.92 ml/m  AORTIC VALVE LVOT Vmax:   132.00 cm/s LVOT Vmean:  105.000 cm/s LVOT VTI:    0.213 m  AORTA Ao Root diam: 2.90 cm MITRAL VALVE MV Area (PHT): 3.89 cm    SHUNTS MV Decel Time: 195 msec    Systemic VTI:  0.21 m MV E velocity: 88.30 cm/s  Systemic Diam: 2.20 cm Redell Shallow MD Electronically signed by Redell Shallow MD Signature Date/Time: 11/12/2024/1:14:22 PM    Final    US  RENAL Result Date: 11/11/2024 CLINICAL DATA:  Renal failure EXAM: RENAL / URINARY TRACT ULTRASOUND COMPLETE COMPARISON:  11/11/2024 FINDINGS: Right Kidney: Renal measurements: 13.0 x 7.7 by 4.7 cm = volume: 248.2 mL. Moderate renal cortical thinning. Normal echotexture. Severe hydronephrosis similar to recent CT. Left Kidney: Renal measurements: 9.3 x 5.0 x 4.1 cm = volume: 101.3 mL. Moderate renal cortical atrophy. Normal renal cortical echotexture. Severe hydronephrosis similar  to recent CT. Bladder: The bladder is decompressed by Foley catheter, with limited visualization. Marked bladder wall thickening and trabeculation were seen on preceding CT. Other: None. IMPRESSION: 1. Moderate bilateral renal cortical thinning. 2. Severe bilateral hydronephrosis, likely due to chronic bladder outlet obstruction from an enlarged prostate. 3. Nonvisualization of the bladder due to decompression by Foley catheter. Please refer to recent CT exam for bladder findings. Electronically Signed   By: [name] Daring M.D.   On: 11/11/2024 16:18   CT Head Wo Contrast Result Date: 11/11/2024 CLINICAL DATA:  Head trauma EXAM: CT HEAD WITHOUT CONTRAST TECHNIQUE:  Contiguous axial images were obtained from the base of the skull through the vertex without intravenous contrast. RADIATION DOSE REDUCTION: This exam was performed according to the departmental dose-optimization program which includes automated exposure control, adjustment of the mA and/or kV according to patient size and/or use of iterative reconstruction technique. COMPARISON:  04/11/2007 FINDINGS: Brain: Encephalomalacia related to prior right parietal infarct. No evidence of acute infarct or hemorrhage. Lateral ventricles and midline structures are unremarkable. No acute extra-axial fluid collections. No mass effect. Vascular: No hyperdense vessel or unexpected calcification. Skull: Normal. Negative for fracture or focal lesion. Sinuses/Orbits: No acute finding. Other: None. IMPRESSION: 1. Chronic right parietal infarct. 2. No acute intracranial process. Electronically Signed   By: [name] Daring M.D.   On: 11/11/2024 14:58   CT ABDOMEN PELVIS WO CONTRAST Result Date: 11/11/2024 CLINICAL DATA:  Fall, abdominal trauma, evaluate for retroperitoneal hematoma. EXAM: CT ABDOMEN AND PELVIS WITHOUT CONTRAST TECHNIQUE: Multidetector CT imaging of the abdomen and pelvis was performed following the standard protocol without IV contrast. RADIATION DOSE REDUCTION: This exam was performed according to the departmental dose-optimization program which includes automated exposure control, adjustment of the mA and/or kV according to patient size and/or use of iterative reconstruction technique. COMPARISON:  None Available. FINDINGS: Lower chest: Possible mild basilar interstitial lung abnormality. Atherosclerotic calcification of the aorta, aortic valve and coronary arteries. Heart is mildly enlarged. No pericardial or pleural effusion. Distal esophagus is grossly unremarkable. Hepatobiliary: Liver and gallbladder are unremarkable. No biliary ductal dilatation. Pancreas: Negative. Spleen: Negative. Adrenals/Urinary Tract:  Adrenal glands are unremarkable. Severe bilateral hydronephrosis. Marked bladder wall thickening. Foley catheter and air in the bladder which is largely decompressed. Somewhat loculated perivesical fluid associated stranding. Stomach/Bowel: Stomach, small bowel, appendix and colon are unremarkable. Vascular/Lymphatic: Atherosclerotic calcification of the aorta. No pathologically enlarged lymph nodes. Reproductive: Enlarged prostate. Other: No free fluid. Musculoskeletal: Degenerative changes in the spine. No fracture. Mild levoconvex scoliosis. IMPRESSION: 1. Somewhat loculated appearing fluid around the bladder with inflammatory haziness and stranding, findings which may be due to trauma/contusion. Alternatively, given bladder wall thickening and an enlarged prostate, an element of outlet obstruction with associated cystitis could have this appearance. 2. Associated severe bilateral hydronephrosis. 3. Otherwise, no evidence of acute trauma. 4. Suspect mild basilar interstitial lung abnormality (ILA). Electronically Signed   By: Newell Eke M.D.   On: 11/11/2024 14:53   DG Chest Portable 1 View Result Date: 11/11/2024 CLINICAL DATA:  Weakness. EXAM: PORTABLE CHEST 1 VIEW COMPARISON:  04/11/2007 FINDINGS: Lungs are adequately inflated and otherwise clear. Cardiomediastinal silhouette and remainder of the exam is unchanged. IMPRESSION: No active disease. Electronically Signed   By: Toribio Agreste M.D.   On: 11/11/2024 13:47    ------  Ole Bourdon, NP Pager: [phone]   Please contact the urology consult pager with any further questions/concerns.     [1] No Known Allergies [2]  Social  History Tobacco Use   Smoking status: Former    Current packs/day: 0.00    Types: Cigarettes    Quit date: 03/30/1975    Years since quitting: 49.6   Smokeless tobacco: Never  Substance Use Topics   Alcohol use: No   Drug use: No   "

## 2024-11-14 ENCOUNTER — Inpatient Hospital Stay (HOSPITAL_COMMUNITY)

## 2024-11-14 DIAGNOSIS — N179 Acute kidney failure, unspecified: Secondary | ICD-10-CM | POA: Diagnosis not present

## 2024-11-14 DIAGNOSIS — I129 Hypertensive chronic kidney disease with stage 1 through stage 4 chronic kidney disease, or unspecified chronic kidney disease: Secondary | ICD-10-CM | POA: Diagnosis not present

## 2024-11-14 DIAGNOSIS — N184 Chronic kidney disease, stage 4 (severe): Secondary | ICD-10-CM | POA: Diagnosis not present

## 2024-11-14 DIAGNOSIS — E1122 Type 2 diabetes mellitus with diabetic chronic kidney disease: Secondary | ICD-10-CM | POA: Diagnosis not present

## 2024-11-14 LAB — CBC
HCT: 22.8 % — ABNORMAL LOW (ref 39.0–52.0)
HCT: 25.1 % — ABNORMAL LOW (ref 39.0–52.0)
Hemoglobin: 7.9 g/dL — ABNORMAL LOW (ref 13.0–17.0)
Hemoglobin: 8.5 g/dL — ABNORMAL LOW (ref 13.0–17.0)
MCH: 32.6 pg (ref 26.0–34.0)
MCH: 32.3 pg (ref 26.0–34.0)
MCHC: 34.6 g/dL (ref 30.0–36.0)
MCHC: 33.9 g/dL (ref 30.0–36.0)
MCV: 94.2 fL (ref 80.0–100.0)
MCV: 95.4 fL (ref 80.0–100.0)
Platelets: 173 K/uL (ref 150–400)
Platelets: 167 K/uL (ref 150–400)
RBC: 2.42 MIL/uL — ABNORMAL LOW (ref 4.22–5.81)
RBC: 2.63 MIL/uL — ABNORMAL LOW (ref 4.22–5.81)
RDW: 16.6 % — ABNORMAL HIGH (ref 11.5–15.5)
RDW: 16.6 % — ABNORMAL HIGH (ref 11.5–15.5)
WBC: 13.4 K/uL — ABNORMAL HIGH (ref 4.0–10.5)
WBC: 15.1 K/uL — ABNORMAL HIGH (ref 4.0–10.5)
nRBC: 0 % (ref 0.0–0.2)
nRBC: 0 % (ref 0.0–0.2)

## 2024-11-14 LAB — BPAM RBC
Blood Product Expiration Date: 202602112359
ISSUE DATE / TIME: 202601160726
Unit Type and Rh: 600

## 2024-11-14 LAB — GLUCOSE, CAPILLARY
Glucose-Capillary: 111 mg/dL — ABNORMAL HIGH (ref 70–99)
Glucose-Capillary: 126 mg/dL — ABNORMAL HIGH (ref 70–99)
Glucose-Capillary: 104 mg/dL — ABNORMAL HIGH (ref 70–99)
Glucose-Capillary: 88 mg/dL (ref 70–99)

## 2024-11-14 LAB — TYPE AND SCREEN
ABO/RH(D): A NEG
Antibody Screen: NEGATIVE
Unit division: 0

## 2024-11-14 LAB — BASIC METABOLIC PANEL WITH GFR
Anion gap: 17 — ABNORMAL HIGH (ref 5–15)
Anion gap: 15 (ref 5–15)
BUN: 129 mg/dL — ABNORMAL HIGH (ref 8–23)
BUN: 122 mg/dL — ABNORMAL HIGH (ref 8–23)
CO2: 21 mmol/L — ABNORMAL LOW (ref 22–32)
CO2: 21 mmol/L — ABNORMAL LOW (ref 22–32)
Calcium: 7.6 mg/dL — ABNORMAL LOW (ref 8.9–10.3)
Calcium: 7.6 mg/dL — ABNORMAL LOW (ref 8.9–10.3)
Chloride: 97 mmol/L — ABNORMAL LOW (ref 98–111)
Chloride: 99 mmol/L (ref 98–111)
Creatinine, Ser: 7.26 mg/dL — ABNORMAL HIGH (ref 0.61–1.24)
Creatinine, Ser: 7.11 mg/dL — ABNORMAL HIGH (ref 0.61–1.24)
GFR, Estimated: 7 mL/min — ABNORMAL LOW
GFR, Estimated: 7 mL/min — ABNORMAL LOW
Glucose, Bld: 116 mg/dL — ABNORMAL HIGH (ref 70–99)
Glucose, Bld: 95 mg/dL (ref 70–99)
Potassium: 3.5 mmol/L (ref 3.5–5.1)
Potassium: 4.2 mmol/L (ref 3.5–5.1)
Sodium: 134 mmol/L — ABNORMAL LOW (ref 135–145)
Sodium: 134 mmol/L — ABNORMAL LOW (ref 135–145)

## 2024-11-14 LAB — PROTIME-INR
INR: 1.3 — ABNORMAL HIGH (ref 0.8–1.2)
Prothrombin Time: 16.5 s — ABNORMAL HIGH (ref 11.4–15.2)

## 2024-11-14 LAB — PHOSPHORUS: Phosphorus: 4.9 mg/dL — ABNORMAL HIGH (ref 2.5–4.6)

## 2024-11-14 LAB — MAGNESIUM: Magnesium: 2.2 mg/dL (ref 1.7–2.4)

## 2024-11-14 MED ORDER — HEPARIN SODIUM (PORCINE) 5000 UNIT/ML IJ SOLN
5000.0000 [IU] | Freq: Three times a day (TID) | INTRAMUSCULAR | Status: DC
  Administered 2024-11-14 – 2024-11-17 (×7): 5000 [IU] via SUBCUTANEOUS
  Filled 2024-11-14 (×7): qty 1

## 2024-11-14 MED ORDER — POTASSIUM CHLORIDE 20 MEQ PO PACK: 60.0000 meq | PACK | Freq: Once | ORAL | Status: DC

## 2024-11-14 MED ORDER — POTASSIUM CHLORIDE 10 MEQ/100ML IV SOLN
10.0000 meq | INTRAVENOUS | Status: AC
  Administered 2024-11-14 (×6): 10 meq via INTRAVENOUS
  Filled 2024-11-14 (×6): qty 100

## 2024-11-14 MED ORDER — MIRTAZAPINE 15 MG PO TBDP
15.0000 mg | ORAL_TABLET | Freq: Every day | ORAL | Status: DC
  Administered 2024-11-15 – 2024-12-01 (×17): 15 mg via ORAL
  Filled 2024-11-14 (×19): qty 1

## 2024-11-14 NOTE — Plan of Care (Signed)

## 2024-11-14 NOTE — Progress Notes (Signed)
 "  NAME:  [name], MRN:  [id number], DOB:  30-Aug-1941, LOS: 3 ADMISSION DATE:  11/11/2024, CONSULTATION DATE:  11/11/24 REFERRING MD:  Dr. Dreama , CHIEF COMPLAINT:  acute renal failure, hypotension    History of Present Illness:  [name] is a 84 y.o. male with PMH of HTN, HLD, NSTEMI s/p DES to LAD (2008), ischemic cardiomyopathy, HFmrEF, and evidence of prior renal insufficiency who presents to the ED today with complains of generalized weakness and fatigue, lack of energy, and decreased appetite with poor PO intake since christmas (Dec 2025). Patient reports during this time he had noticed decreased urine output as well. Patient reports no prior history of this.  On arrival to the ED patient was found to be hypotensive 80/40s, started on levophed . CBC with leukocytosis and anemia with hgb 7-9. BMP with cr 14, BUN > 130, K+ 7.0 and received 1L NS bolus, calcium , insulin , D50, lokelma  with repeat K+ 5.9. Foley placed in ED, urinalysis with large leukocytes and received cefepime , vancomycin , flagyl  in the ED.    PCCM was consulted for shock  Pertinent  Medical History  has a past medical history of CHF (congestive heart failure) (HCC), Coronary artery disease (2008), CVA (cerebral vascular accident) (HCC) (2008), Diabetes mellitus, Edema, Heart murmur, Hyperlipidemia, Hypertension, Long term current use of anticoagulant, Mitral insufficiency, Myocardial infarction, old (2008), and Obesity.   Significant Hospital Events: Including procedures, antibiotic start and stop dates in addition to other pertinent events   1/14: admit to [facility]  1/15: CVC placement   Interim History / Subjective:  Saw pt at bedside. Denies abdominal pain, CP. No acute complaints from the pt. Was only able to eat a spoon full of rice last night. Denies dysphagia.   Objective    Blood pressure (!) 109/53, pulse 72, temperature 98.5 F (36.9 C), temperature source Oral, resp. rate 13, height 5' 5 (1.651 m),  weight 75.5 kg, SpO2 96%.        Intake/Output Summary (Last 24 hours) at 11/14/2024 0842 Last data filed at 11/14/2024 0800 Gross per 24 hour  Intake 3331.22 ml  Output 2200 ml  Net 1131.22 ml   Filed Weights   11/12/24 0630 11/13/24 0437 11/14/24 0545  Weight: 75.7 kg 73.6 kg 75.5 kg    Examination: Constitutional:      Comments:   pale, resting comfortably in bed  HENT:     Head: Normocephalic and atraumatic.     Nose: Nose normal.  Cardiovascular:     Rate and Rhythm: Normal rate  Pulmonary:     Effort: Pulmonary effort is normal. No respiratory distress.     Breath sounds: Normal breath sounds. No wheezing.  Abdominal:     General: Bowel sounds are normal. There is no distension.     Palpations: Abdomen is soft.     Tenderness: There is no abdominal tenderness.     Comments: No suprapubic tenderness   Genitourinary:    Comments: no bloody UOP from foley Musculoskeletal:        Comments: No swelling in BL ankles , +2 DP BL Skin:    General: Skin is warm and dry.  Neurological:     Mental Status: alert, at baseline   Resolved problem list   Assessment and Plan  Severe AKI on CKD 4 Shock 2/2 septic shock from UTI, hypovolemic due to poor intake Hyperkalemia secondary to renal failure, resolved  Metabolic Acidosis, improving    Imaging on admission showed loculated fluid  around bladder with enlarged prostate and BL hydronephrosis. AKI likely due to obstructive uropathy. BUN and Cr improving. Good urine output, Hematuria has resolved this AM.   Urology to evaluate for bilateral stents versus percutaneous nephrostomy tubes requirements following renal US  results today.   Pt on CBI since 1/16. Foley bag with clear UOP. ABL was likely not from urinary source.    Increased pressure requirements with low BP on admission. Weaned off of vasopressin  and levophed  this AM. BP this AM soft but MAPS mostly > 65.  Low BP likely due to poor PO intake, see below for more  information.    Treatment for UTI as discussed below.  Potassium levels wnl.    - CVC placement 1/15 -off pressors  -f/u renal US  -On CBI -Hematuria resolved  -Urology consulted, appreciate recommendations   -RD consult placed, appreciate recommendations   -Hold home farxiga, lasix , aldactone , losartan   -Foley placed on admission -Nephrology consulted, appreciate recommendations     Urinary tract infection, POA -Imaging on admission showed loculated fluid around bladder with enlarged prostate and BL hydronephrosis.  Urine cx negative. Continue CTX for 7 days for complicated UTI.    Plan: -continue CTX today  -Urine Cx negative  -Bcx NGTD  Afib Developed afib with RVR 1/15 and was started on amiodarone . Likely due to acute condition. Rates between 67-78 this AM. Telemetry still shows alternating irregular to normal rhythm. EKG shows NSR with first degree AV block. Will d/c amiodarone .  -d/c amiodarone     Acute on Chronic anemia S/p 1 unit prbcs 1/16. Hgb baseline 11-12. Hgb stable this AM. No concerns for GI bleed at present.  Hematuria resolved, likely not the reason behind hgb drop. Drop in hgb could be to dilutional reasons from fluid resuscitation on admission to CCM. PT/INR slightly high, not concerning for  coagulopathy. Will monitor for any worsening of hgb and transfuse as need.    -CBC daily  -Transfuse if hgb  <7  Inadequate Nutrition  Pt Still having difficulty with low appetite. Pt said he will try to eat some more today. Coretrack to be placed on Monday 1/19. Had extensive discussion about pt and family that pt's condition is improving, however, long term recovery requires adequate nutrition. Pt denies dysphagia. Denies depression and was unsure why he has lost interest in eating, mentions the texture is making uncomfortable. Unclear reason behind low-appetie but do suspect depression might have a role to play. Will start on mirtazipine. After further discussion,  family was open to GOC conversation and wanted to talk to palliative care. Palliative care consulted.   -Coretrack on Monday 1/19 -Mirtazapine  15 mg nightly  -Palliative care consulted for GOC, appreciate recommendations    History of NSTEMI s/p DES to LAD (2008) History of ischemic cardiomyopathy History of HFmrEF Echo 2022: EF 45-50%. Repeat echo showed EF: 55-60 %, improved with regional wall motion abnormalities.     Plan:  -Holding home medications including carvedilol , lasix , losartan , aldactone    Hx of HTN  Increased pressor requirements as discussed above.  -holding home medications while on pressors and renal failure    DM CBGs are appropriate.  On SSI.  -Continue SSI   Labs   CBC: Recent Labs  Lab 11/11/24 1230 11/11/24 1232 11/11/24 1643 11/12/24 0500 11/12/24 1813 11/13/24 0432 11/13/24 1657 11/14/24 0447  WBC 20.7*  --  21.8* 13.5* 12.4* 13.9*  --  13.4*  NEUTROABS 18.9*  --   --   --   --   --   --   --  HGB 9.2*   < > 7.7* 7.5* 7.1* 6.9* 7.5* 7.9*  HCT 28.7*   < > 23.5* 22.1* 20.9* 20.0* 21.7* 22.8*  MCV 102.1*  --  97.9 96.5 96.3 95.7  --  94.2  PLT 276  --  232 236 220 193  --  173   < > = values in this interval not displayed.    Basic Metabolic Panel: Recent Labs  Lab 11/12/24 0500 11/12/24 1813 11/13/24 0037 11/13/24 0432 11/13/24 1325 11/13/24 1626 11/14/24 0447  NA 132*   < > 131* 132* 130* 132* 134*  K 4.1   < > 3.8 3.6 3.6 3.6 3.5  CL 101   < > 96* 96* 94* 95* 97*  CO2 15*   < > 21* 21* 21* 22 21*  GLUCOSE 219*   < > 163* 165* 154* 126* 116*  BUN 161*   < > 140* 136* 132* 131* 129*  CREATININE 9.70*   < > 8.06* 7.98* 7.61* 7.55* 7.26*  CALCIUM  7.5*   < > 7.3* 7.2* 7.3* 7.4* 7.6*  MG 1.9  --   --  2.1  --   --  2.2  PHOS 4.0  --   --  4.0  --   --  4.9*   < > = values in this interval not displayed.   GFR: Estimated Creatinine Clearance: 7.4 mL/min (A) (by C-G formula based on SCr of 7.26 mg/dL (H)). Recent Labs  Lab  11/11/24 1239 11/11/24 1437 11/11/24 1643 11/12/24 0500 11/12/24 1813 11/13/24 0432 11/14/24 0447  WBC  --   --    < > 13.5* 12.4* 13.9* 13.4*  LATICACIDVEN 1.4 1.2  --   --   --   --   --    < > = values in this interval not displayed.    Liver Function Tests: Recent Labs  Lab 11/11/24 1425 11/11/24 1643  AST 19 19  ALT 20 18  ALKPHOS 78 62  BILITOT 0.3 0.2  PROT 5.7* 4.9*  ALBUMIN 2.5* 2.2*   No results for input(s): LIPASE, AMYLASE in the last 168 hours. No results for input(s): AMMONIA in the last 168 hours.  ABG    Component Value Date/Time   HCO3 8.7 (L) 11/11/2024 1336   TCO2 9 (L) 11/11/2024 1336   ACIDBASEDEF 18.0 (H) 11/11/2024 1336   O2SAT 77 11/11/2024 1336     Coagulation Profile: Recent Labs  Lab 11/14/24 0447  INR 1.3*    Cardiac Enzymes: No results for input(s): CKTOTAL, CKMB, CKMBINDEX, TROPONINI in the last 168 hours.  HbA1C: Hgb A1c MFr Bld  Date/Time Value Ref Range Status  11/12/2024 05:00 AM 6.4 (H) 4.8 - 5.6 % Final    Comment:    (NOTE) Diagnosis of Diabetes The following HbA1c ranges recommended by the American Diabetes Association (ADA) may be used as an aid in the diagnosis of diabetes mellitus.  Hemoglobin             Suggested A1C NGSP%              Diagnosis  <5.7                   Non Diabetic  5.7-6.4                Pre-Diabetic  >6.4                   Diabetic  <7.0  Glycemic control for                       adults with diabetes.    04/11/2007 03:20 AM (H)  Final   8.8 (NOTE)   The ADA recommends the following therapeutic goals for glycemic   control related to Hgb A1C measurement:   Goal of Therapy:   < 7.0% Hgb A1C   Action Suggested:  > 8.0% Hgb A1C   Ref:  Diabetes Care, 22, Suppl. 1, 1999  04/11/2007 03:20 AM (H)  Final   9.4 (NOTE)   The ADA recommends the following therapeutic goals for glycemic   control related to Hgb A1C measurement:   Goal of Therapy:   < 7.0% Hgb  A1C   Action Suggested:  > 8.0% Hgb A1C   Ref:  Diabetes Care, 22, Suppl. 1, 1999    CBG: Recent Labs  Lab 11/13/24 0743 11/13/24 1115 11/13/24 1532 11/13/24 1919 11/14/24 0710  GLUCAP 165* 161* 141* 117* 111*    Review of Systems:   Per above  Past Medical History:  He,  has a past medical history of CHF (congestive heart failure) (HCC), Coronary artery disease (2008), CVA (cerebral vascular accident) (HCC) (2008), Diabetes mellitus, Edema, Heart murmur, Hyperlipidemia, Hypertension, Long term current use of anticoagulant, Mitral insufficiency, Myocardial infarction, old (2008), and Obesity.   Surgical History:   Past Surgical History:  Procedure Laterality Date   CORONARY ANGIOPLASTY WITH STENT PLACEMENT  04/10/2007   with drug-eluting stent placement of the mid LAD   KNEE SURGERY       Social History:   reports that he quit smoking about 49 years ago. His smoking use included cigarettes. He has never used smokeless tobacco. He reports that he does not drink alcohol and does not use drugs.   Family History:  His family history includes Dementia in his mother; Heart Problems in his father. There is no history of Coronary artery disease or Congestive Heart Failure.   Allergies Allergies[1]   Home Medications  Prior to Admission medications  Medication Sig Start Date End Date Taking? Authorizing Provider  aspirin  81 MG tablet Take 81 mg by mouth at bedtime.   Yes [provider]  atorvastatin  (LIPITOR) 80 MG tablet Take 1 tablet (80 mg total) by mouth daily. Patient taking differently: Take 80 mg by mouth at bedtime. 11/03/24  Yes Jordan, Peter M, MD  carvedilol  (COREG ) 12.5 MG tablet Take 1 tablet (12.5 mg total) by mouth 2 (two) times daily. 11/03/24 02/01/25 Yes Jordan, Peter M, MD  dapagliflozin propanediol (FARXIGA) 5 MG TABS tablet Take 5 mg by mouth at bedtime.   Yes [provider]  furosemide  (LASIX ) 20 MG tablet Take 1 tablet (20 mg total) by mouth  daily. 11/03/24  Yes Jordan, Peter M, MD  losartan  (COZAAR ) 100 MG tablet Take 1 tablet (100 mg total) by mouth daily. 11/03/24  Yes Jordan, Peter M, MD  Lutein-Zeaxanthin (OCUVITE LUTEIN 25 PO) Take 1 capsule by mouth at bedtime.   Yes [provider]  Multiple Vitamins-Minerals (CENTRUM SILVER PO) Take 1 tablet by mouth daily.    Yes [provider]  nitroGLYCERIN  (NITROSTAT ) 0.4 MG SL tablet Place 1 tablet (0.4 mg total) under the tongue every 5 (five) minutes as needed for chest pain (x 3 doses). 11/03/24  Yes Jordan, Peter M, MD  spironolactone  (ALDACTONE ) 25 MG tablet Take 1/2 tablet by mouth (12.5 mg) daily 11/03/24  Yes Jordan, Peter  M, MD     Critical care time:                 [1] No Known Allergies  "

## 2024-11-14 NOTE — Plan of Care (Signed)
" °  Problem: Clinical Measurements: Goal: Ability to maintain clinical measurements within normal limits will improve Outcome: Progressing   Problem: Clinical Measurements: Goal: Will remain free from infection Outcome: Progressing   Problem: Clinical Measurements: Goal: Diagnostic test results will improve Outcome: Progressing   Problem: Clinical Measurements: Goal: Cardiovascular complication will be avoided Outcome: Progressing   Problem: Tissue Perfusion: Goal: Adequacy of tissue perfusion will improve Outcome: Progressing   Plan of care, monitoring, assessment, treatment, and intervention (s) ongoing, see MAR see flowsheet "

## 2024-11-14 NOTE — Progress Notes (Signed)
 1745 protein shakes given today were not consumed

## 2024-11-14 NOTE — Progress Notes (Signed)
 Admit: 11/11/2024 LOS: 3  53M with severe AKI (baseline GFR unclear), severe life threatening hyperkalmeia, metabolic acidosis.   Subjective:  Remains on low-dose norepinephrine   Receiving continuous bladder irrigation and urine has cleared Improvement in creatinine has stalled, 7.26 today, BUN 129, K3.5 ?2.2 L urine output recorded  01/16 0701 - 01/17 0700 In: 3307.1 [I.V.:1012.1; Blood:375; IV Piggyback:100] Out: 2200 [Urine:2200]  Filed Weights   11/12/24 0630 11/13/24 0437 11/14/24 0545  Weight: 75.7 kg 73.6 kg 75.5 kg    Scheduled Meds:  Chlorhexidine  Gluconate Cloth  6 each Topical Daily   feeding supplement  237 mL Oral BID BM   insulin  aspart  0-9 Units Subcutaneous TID WC   multivitamin  1 tablet Oral QHS   Continuous Infusions:  amiodarone  30 mg/hr (11/14/24 0800)   cefTRIAXone  (ROCEPHIN )  IV Stopped (11/13/24 1338)   norepinephrine  (LEVOPHED ) Adult infusion 2 mcg/min (11/14/24 0800)   vasopressin  Stopped (11/13/24 1348)   PRN Meds:.acetaminophen , polyethylene glycol, senna  Current Labs: reviewed  Blood and urine cultures from admission are pending  Physical Exam:  Blood pressure (!) 109/53, pulse 72, temperature 98.5 F (36.9 C), temperature source Oral, resp. rate 13, height 5' 5 (1.651 m), weight 75.5 kg, SpO2 96%. GEN: Elderly male awake, NAD ENT: NCAT EYES: EOMI CV: Regular, no murmur or rub PULM: Clear throughout, normal work of breathing ABD: Soft nontender  SKIN: No significant lower extremity edema EXT: No LEE  A Severe AKI likely from obstructive uropathy; recovery has reached a plateau; has not req RRT Hyperkalemia, Resolved Metabolic Acidosis, improved Hemorrhagic and purulent UTI with underlying progressive BPH and BOO; ceftriaxone  per CCM, culture data negative to date.  Now receiving CBI with urology.  Repeat imaging today to demonstrate resolution of hydronephrosis and make sure that obstruction is not more proximal. Hypotension / shock,  likely from septic shock/hypovolemia; remains on low-dose norepinephrine  Leukocytosis, WBC 21 at presentation, improved  Macrocytic anemia, worsened from presentation; PRBC 1/16  P Agree with repeat imaging and if persistent hydronephrosis need to consider ureteral stents or PCNs. No need for dialysis Can start on tamsulosin once blood pressures improved Medication Issues; Preferred narcotic agents for pain control are hydromorphone, fentanyl , and methadone. Morphine should not be used.  Baclofen should be avoided Avoid oral sodium phosphate and magnesium  citrate based laxatives / bowel preps    Bernardino Gasman MD 11/14/2024, 8:53 AM  Recent Labs  Lab 11/12/24 0500 11/12/24 1813 11/13/24 0432 11/13/24 1325 11/13/24 1626 11/14/24 0447  NA 132*   < > 132* 130* 132* 134*  K 4.1   < > 3.6 3.6 3.6 3.5  CL 101   < > 96* 94* 95* 97*  CO2 15*   < > 21* 21* 22 21*  GLUCOSE 219*   < > 165* 154* 126* 116*  BUN 161*   < > 136* 132* 131* 129*  CREATININE 9.70*   < > 7.98* 7.61* 7.55* 7.26*  CALCIUM  7.5*   < > 7.2* 7.3* 7.4* 7.6*  PHOS 4.0  --  4.0  --   --  4.9*   < > = values in this interval not displayed.   Recent Labs  Lab 11/11/24 1230 11/11/24 1232 11/12/24 1813 11/13/24 0432 11/13/24 1657 11/14/24 0447  WBC 20.7*   < > 12.4* 13.9*  --  13.4*  NEUTROABS 18.9*  --   --   --   --   --   HGB 9.2*   < > 7.1* 6.9* 7.5* 7.9*  HCT 28.7*   < > 20.9* 20.0* 21.7* 22.8*  MCV 102.1*   < > 96.3 95.7  --  94.2  PLT 276   < > 220 193  --  173   < > = values in this interval not displayed.

## 2024-11-15 ENCOUNTER — Other Ambulatory Visit: Payer: Self-pay

## 2024-11-15 DIAGNOSIS — A419 Sepsis, unspecified organism: Secondary | ICD-10-CM | POA: Diagnosis not present

## 2024-11-15 DIAGNOSIS — D631 Anemia in chronic kidney disease: Secondary | ICD-10-CM | POA: Diagnosis not present

## 2024-11-15 DIAGNOSIS — E1122 Type 2 diabetes mellitus with diabetic chronic kidney disease: Secondary | ICD-10-CM | POA: Diagnosis not present

## 2024-11-15 DIAGNOSIS — I129 Hypertensive chronic kidney disease with stage 1 through stage 4 chronic kidney disease, or unspecified chronic kidney disease: Secondary | ICD-10-CM | POA: Diagnosis not present

## 2024-11-15 DIAGNOSIS — E440 Moderate protein-calorie malnutrition: Secondary | ICD-10-CM | POA: Insufficient documentation

## 2024-11-15 DIAGNOSIS — N184 Chronic kidney disease, stage 4 (severe): Secondary | ICD-10-CM | POA: Diagnosis not present

## 2024-11-15 DIAGNOSIS — N179 Acute kidney failure, unspecified: Secondary | ICD-10-CM | POA: Diagnosis not present

## 2024-11-15 LAB — BASIC METABOLIC PANEL WITH GFR
Anion gap: 16 — ABNORMAL HIGH (ref 5–15)
Anion gap: 22 — ABNORMAL HIGH (ref 5–15)
BUN: 122 mg/dL — ABNORMAL HIGH (ref 8–23)
BUN: 118 mg/dL — ABNORMAL HIGH (ref 8–23)
CO2: 21 mmol/L — ABNORMAL LOW (ref 22–32)
CO2: 16 mmol/L — ABNORMAL LOW (ref 22–32)
Calcium: 7.6 mg/dL — ABNORMAL LOW (ref 8.9–10.3)
Calcium: 8 mg/dL — ABNORMAL LOW (ref 8.9–10.3)
Chloride: 100 mmol/L (ref 98–111)
Chloride: 101 mmol/L (ref 98–111)
Creatinine, Ser: 6.86 mg/dL — ABNORMAL HIGH (ref 0.61–1.24)
Creatinine, Ser: 6.74 mg/dL — ABNORMAL HIGH (ref 0.61–1.24)
GFR, Estimated: 7 mL/min — ABNORMAL LOW
GFR, Estimated: 8 mL/min — ABNORMAL LOW
Glucose, Bld: 86 mg/dL (ref 70–99)
Glucose, Bld: 84 mg/dL (ref 70–99)
Potassium: 3.8 mmol/L (ref 3.5–5.1)
Potassium: 4 mmol/L (ref 3.5–5.1)
Sodium: 136 mmol/L (ref 135–145)
Sodium: 140 mmol/L (ref 135–145)

## 2024-11-15 LAB — CBC
HCT: 24.7 % — ABNORMAL LOW (ref 39.0–52.0)
HCT: 29.4 % — ABNORMAL LOW (ref 39.0–52.0)
Hemoglobin: 8.5 g/dL — ABNORMAL LOW (ref 13.0–17.0)
Hemoglobin: 9.9 g/dL — ABNORMAL LOW (ref 13.0–17.0)
MCH: 32.8 pg (ref 26.0–34.0)
MCH: 33 pg (ref 26.0–34.0)
MCHC: 34.4 g/dL (ref 30.0–36.0)
MCHC: 33.7 g/dL (ref 30.0–36.0)
MCV: 95.4 fL (ref 80.0–100.0)
MCV: 98 fL (ref 80.0–100.0)
Platelets: 172 K/uL (ref 150–400)
Platelets: 200 K/uL (ref 150–400)
RBC: 2.59 MIL/uL — ABNORMAL LOW (ref 4.22–5.81)
RBC: 3 MIL/uL — ABNORMAL LOW (ref 4.22–5.81)
RDW: 16.6 % — ABNORMAL HIGH (ref 11.5–15.5)
RDW: 16.8 % — ABNORMAL HIGH (ref 11.5–15.5)
WBC: 13.3 K/uL — ABNORMAL HIGH (ref 4.0–10.5)
WBC: 20 K/uL — ABNORMAL HIGH (ref 4.0–10.5)
nRBC: 0 % (ref 0.0–0.2)
nRBC: 0 % (ref 0.0–0.2)

## 2024-11-15 LAB — PHOSPHORUS: Phosphorus: 5.4 mg/dL — ABNORMAL HIGH (ref 2.5–4.6)

## 2024-11-15 LAB — GLUCOSE, CAPILLARY
Glucose-Capillary: 81 mg/dL (ref 70–99)
Glucose-Capillary: 82 mg/dL (ref 70–99)
Glucose-Capillary: 83 mg/dL (ref 70–99)
Glucose-Capillary: 79 mg/dL (ref 70–99)
Glucose-Capillary: 92 mg/dL (ref 70–99)

## 2024-11-15 LAB — MAGNESIUM: Magnesium: 2 mg/dL (ref 1.7–2.4)

## 2024-11-15 MED ORDER — SIMETHICONE 80 MG PO CHEW
80.0000 mg | CHEWABLE_TABLET | Freq: Four times a day (QID) | ORAL | Status: DC | PRN
  Administered 2024-11-15 – 2024-11-18 (×4): 80 mg via ORAL
  Filled 2024-11-15 (×5): qty 1

## 2024-11-15 NOTE — Plan of Care (Signed)

## 2024-11-15 NOTE — Progress Notes (Signed)
 "   Subjective: His Cr is down to 6.86 and his urine is clear with good output.  He has no catheter complaints ROS:  Review of Systems  All other systems reviewed and are negative.   Anti-infectives: Anti-infectives (From admission, onward)    Start     Dose/Rate Route Frequency Ordered Stop   11/13/24 1400  cefTRIAXone  (ROCEPHIN ) 1 g in sodium chloride  0.9 % 100 mL IVPB        1 g 200 mL/hr over 30 Minutes Intravenous Every 24 hours 11/13/24 0935 11/16/24 1359   11/12/24 1400  ceFEPIme  (MAXIPIME ) 1 g in sodium chloride  0.9 % 100 mL IVPB  Status:  Discontinued        1 g 200 mL/hr over 30 Minutes Intravenous Every 24 hours 11/12/24 0824 11/13/24 0935   11/12/24 0800  cefTRIAXone  (ROCEPHIN ) 2 g in sodium chloride  0.9 % 100 mL IVPB  Status:  Discontinued        2 g 200 mL/hr over 30 Minutes Intravenous Every 24 hours 11/11/24 1531 11/12/24 0824   11/11/24 1345  vancomycin  (VANCOREADY) IVPB 1500 mg/300 mL        1,500 mg 150 mL/hr over 120 Minutes Intravenous  Once 11/11/24 1332 11/11/24 1702   11/11/24 1330  metroNIDAZOLE  (FLAGYL ) IVPB 500 mg        500 mg 100 mL/hr over 60 Minutes Intravenous  Once 11/11/24 1321 11/11/24 1558   11/11/24 1330  ceFEPIme  (MAXIPIME ) 2 g in sodium chloride  0.9 % 100 mL IVPB        2 g 200 mL/hr over 30 Minutes Intravenous  Once 11/11/24 1332 11/11/24 1457       Current Facility-Administered Medications  Medication Dose Route Frequency Provider Last Rate Last Admin   acetaminophen  (TYLENOL ) tablet 650 mg  650 mg Oral Q4H PRN Crozier, Peyton, PA-C   650 mg at 11/12/24 1202   cefTRIAXone  (ROCEPHIN ) 1 g in sodium chloride  0.9 % 100 mL IVPB  1 g Intravenous Q24H Syeda, Raeeha, DO   Stopped at 11/14/24 1448   Chlorhexidine  Gluconate Cloth 2 % PADS 6 each  6 each Topical Daily Crozier, Peyton, PA-C   6 each at 11/14/24 1459   feeding supplement (ENSURE PLUS HIGH PROTEIN) liquid 237 mL  237 mL Oral BID BM Gatha Pence, MD   237 mL at 11/14/24 1414    heparin  injection 5,000 Units  5,000 Units Subcutaneous Q8H Mohammed, Shahid, MD   5,000 Units at 11/14/24 1818   insulin  aspart (novoLOG ) injection 0-9 Units  0-9 Units Subcutaneous TID WC Kassie Acquanetta Bradley, MD   1 Units at 11/14/24 1155   mirtazapine  (REMERON  SOL-TAB) disintegrating tablet 15 mg  15 mg Oral QHS Syeda, Raeeha, DO       multivitamin (RENA-VIT) tablet 1 tablet  1 tablet Oral QHS Mohammed, Shahid, MD       norepinephrine  (LEVOPHED ) 4mg  in (0.016 mg/mL) premix infusion  0-40 mcg/min Intravenous Titrated Kassie Acquanetta Bradley, MD   Stopped at 11/14/24 0940   polyethylene glycol (MIRALAX  / GLYCOLAX ) packet 17 g  17 g Oral Daily PRN Hussein, Abdullahi, MD       senna (SENOKOT) tablet 8.6 mg  1 tablet Oral BID PRN Sharie Bourbon, MD       vasopressin  (PITRESSIN) 20 Units in 100 mL (0.2 unit/mL) infusion-*FOR SHOCK*  0-0.03 Units/min Intravenous Continuous Syeda, Raeeha, DO   Stopped at 11/13/24 1348     Objective: Vital signs in last 24 hours: Temp:  [  97.6 F (36.4 C)-100.1 F (37.8 C)] 98.6 F (37 C) (01/18 0708) Pulse Rate:  [66-87] 80 (01/18 0530) Resp:  [8-24] 8 (01/18 0530) BP: (100-147)/(47-71) 100/58 (01/18 0500) SpO2:  [85 %-99 %] 97 % (01/18 0530) Weight:  [76.7 kg] 76.7 kg (01/18 0500)  Intake/Output from previous day: 01/17 0701 - 01/18 0700 In: 4616.6 [I.V.:84.3; IV Piggyback:732.3] Out: 2675 [Urine:875] Intake/Output this shift: No intake/output data recorded.   Physical Exam Vitals reviewed.  Constitutional:      Appearance: Normal appearance.  Neurological:     Mental Status: He is alert.     Lab Results:  Recent Labs    11/14/24 1639 11/15/24 0416  WBC 15.1* 13.3*  HGB 8.5* 8.5*  HCT 25.1* 24.7*  PLT 167 172   BMET Recent Labs    11/14/24 2000 11/15/24 0416  NA 134* 136  K 4.2 3.8  CL 99 100  CO2 21* 21*  GLUCOSE 95 86  BUN 122* 122*  CREATININE 7.11* 6.86*  CALCIUM  7.6* 7.6*   PT/INR Recent Labs    11/14/24 0447   LABPROT 16.5*  INR 1.3*   ABG No results for input(s): PHART, HCO3 in the last 72 hours.  Invalid input(s): PCO2, PO2  Studies/Results: US  RENAL Result Date: 11/14/2024 EXAM: RETROPERITONEAL ULTRASOUND OF THE KIDNEYS 11/14/2024 08:34:00 AM TECHNIQUE: Real-time ultrasonography of the retroperitoneum, specifically the kidneys and urinary bladder, was performed. COMPARISON: CT and ultrasound dated 11/11/2024. CLINICAL HISTORY: Bilateral hydronephrosis. FINDINGS: RIGHT KIDNEY: Right kidney measures 13.2 x 5.8 x 5.5 cm. Unchanged bilateral lobulated kidneys with cortical thinning. Severe hydronephrosis, similar in appearance to prior study. No calculus. No mass. LEFT KIDNEY: Left kidney measures 11.5 x 5.5 x 5.6 cm. Unchanged bilateral lobulated kidneys with cortical thinning. Severe hydronephrosis, similar in appearance to prior study. No calculus. No mass. BLADDER: Limited evaluation of the bladder as the bladder is decompressed with a foley catheter in place. Partially imaged small right pleural effusion and trace perihepatic free fluid. IMPRESSION: 1. Severe bilateral hydronephrosis, similar to prior study. 2. Partially imaged right pleural effusion and trace perihepatic free fluid. Electronically signed by: Michaeline Blanch MD 11/14/2024 10:21 AM EST RP Workstation: [id number]   Labs reviewed.   Assessment and Plan:  AKI  is improving with catheter drainage.  Cr is down to 6.86.  BPH with retention.  He is tolerating the foley and his urine is clear with good output.    Rec: continues observation and foley drainage at this time.  Consider a renal US  in 2-3 days to reassess the degree of hydro and bladder wall thickening.  He may need an MRI of the pelvis at some point to reassess the bladder.      LOS: 4 days    Myliah Medel 11/15/2024 "

## 2024-11-15 NOTE — Plan of Care (Signed)
" °  Problem: Tissue Perfusion: Goal: Adequacy of tissue perfusion will improve Outcome: Progressing   Problem: Skin Integrity: Goal: Risk for impaired skin integrity will decrease Outcome: Progressing   Problem: Nutritional: Goal: Maintenance of adequate nutrition will improve Outcome: Progressing   Problem: Fluid Volume: Goal: Ability to maintain a balanced intake and output will improve Outcome: Progressing   Problem: Coping: Goal: Ability to adjust to condition or change in health will improve Outcome: Progressing  Plan of care, monitoring, assessment, treatment, and intervention (s) ongoing, see MAR see flowsheet   "

## 2024-11-15 NOTE — Progress Notes (Addendum)
 0800 patient alert x4 able to make all need known. Patient refusing to eat he doesn't even want food mentioned around him he states It makes me feel bad and I don't know why. Please don't talk about food around me  0830 per DR Watt CBI discontinued, no plans for any interventions today creatine decreasing   0900 updated family that Palliative unable top come around today   0930 CBI plugged off and discontinued as verbally ordered of urine after CBI  subtracted   1100 100 ML URINE 1200 45 ml urine

## 2024-11-15 NOTE — Progress Notes (Addendum)
 "  NAME:  [name], MRN:  [id number], DOB:  [birth year], LOS: 4 ADMISSION DATE:  11/11/2024, CONSULTATION DATE:  11/11/24 REFERRING MD:  Dr. Dreama , CHIEF COMPLAINT:  acute renal failure, hypotension    History of Present Illness:  [name] is a 84 y.o. male with PMH of HTN, HLD, NSTEMI s/p DES to LAD (2008), ischemic cardiomyopathy, HFmrEF, and evidence of prior renal insufficiency who presents to the ED today with complains of generalized weakness and fatigue, lack of energy, and decreased appetite with poor PO intake since christmas (Dec 2025). Patient reports during this time he had noticed decreased urine output as well. Patient reports no prior history of this.  On arrival to the ED patient was found to be hypotensive 80/40s, started on levophed . CBC with leukocytosis and anemia with hgb 7-9. BMP with cr 14, BUN > 130, K+ 7.0 and received 1L NS bolus, calcium , insulin , D50, lokelma  with repeat K+ 5.9. Foley placed in ED, urinalysis with large leukocytes and received cefepime , vancomycin , flagyl  in the ED.    PCCM was consulted for shock  Pertinent  Medical History  has a past medical history of CHF (congestive heart failure) (HCC), Coronary artery disease (2008), CVA (cerebral vascular accident) (HCC) (2008), Diabetes mellitus, Edema, Heart murmur, Hyperlipidemia, Hypertension, Long term current use of anticoagulant, Mitral insufficiency, Myocardial infarction, old (2008), and Obesity.   Significant Hospital Events: Including procedures, antibiotic start and stop dates in addition to other pertinent events   1/14: admit to [facility]  1/15: CVC placement  1/18: Transferred out of ICU  Interim History / Subjective:  Patient is doing well.  His creatinine has slightly improved.  Urine output is good.  No further hematuria.  He is off Levophed  for 24 hours.  Does not have much of an appetite does not want to talk about food.  Agreeable for [place] tomorrow.  Renal ultrasound shows severe  bilateral hydronephrosis.  Objective    Blood pressure 109/66, pulse 83, temperature 98.6 F (37 C), temperature source Axillary, resp. rate 18, height 5' 5 (1.651 m), weight 76.7 kg, SpO2 97%.        Intake/Output Summary (Last 24 hours) at 11/15/2024 1225 Last data filed at 11/15/2024 1200 Gross per 24 hour  Intake 4228 ml  Output 2795 ml  Net 1433 ml   Filed Weights   11/13/24 0437 11/14/24 0545 11/15/24 0500  Weight: 73.6 kg 75.5 kg 76.7 kg    Examination: Physical exam: General: Chronically ill-appearing male, lying on the bed HEENT: [place]/AT, eyes anicteric.  moist mucus membranes Neuro: Alert, awake following commands Chest: Coarse breath sounds, no wheezes or rhonchi Heart: Regular rate and rhythm, no murmurs or gallops Abdomen: Soft, nontender, nondistended, bowel sounds present GU: Has a foley, clearing urine.    Resolved problem list   Assessment and Plan  Severe AKI on CKD 4 Obstructive uropathy Bilateral hydronephrosis Shock 2/2 septic shock from UTI, hypovolemic due to poor intake -resolved Hyperkalemia secondary to renal failure, resolved  Metabolic Acidosis, improving    Imaging on admission showed loculated fluid around bladder with enlarged prostate and BL hydronephrosis. AKI likely due to obstructive uropathy. BUN and Cr improving. Good urine output, Hematuria has resolved.  Appreciate urology input.  Currently deferring bilateral stents versus percutaneous nephrostomy tubes requirements.  Pt on CBI since 1/16.  CBI will be stopped today.  Increased pressure requirements with low BP on admission. Weaned off of vasopressin  and levophed  this AM. BP this AM soft  but MAPS mostly > 65.  Low BP likely due to poor PO intake, see below for more information.    Treatment for UTI as discussed below.  Potassium levels wnl.    -CVC placement 1/15 -we will keep it in at the time of transfer could be removed whenever the hospitalist team deem necessary. -off  pressors  -On CBI from 1/16 in 20 26 through 1/18 2026. -Hematuria resolved  -Urology consulted, appreciate recommendations  -differing nephrostomies and cystoscopy and stents for now. -RD consult placed, appreciate recommendations   -plans for core track tomorrow. -Hold home farxiga, lasix , aldactone , losartan   -Foley placed on admission -Nephrology consulted, appreciate recommendations     Urinary tract infection, POA -Imaging on admission showed loculated fluid around bladder with enlarged prostate and BL hydronephrosis.  Urine cx negative. Continue CTX for 7 days for complicated UTI.    Plan: -continue CTX -for 7 days. -Urine Cx negative  -Bcx NGTD  Afib - Converted to sinus rhythm now. Developed afib with RVR 1/15 and was started on amiodarone . Likely due to acute condition. Rates between 67-78 this AM. Telemetry still shows alternating irregular to normal rhythm. EKG shows NSR with first degree AV block. Will d/c amiodarone .  - In sinus now. Amiodarone  discontinue 1/17. - We cannot do anticoagulation at this stage given the recent hematuria.    Acute on Chronic anemia S/p 1 unit prbcs 1/16. Hgb baseline 11-12. Hgb stable this AM. No concerns for GI bleed at present.  Hematuria resolved, likely not the reason behind hgb drop.   -CBC daily  -globin today is 8.5. -Transfuse if hgb  <7  Inadequate Nutrition  Pt Still having difficulty with low appetite. Pt said he will try to eat some more today. Coretrack to be placed on Monday 1/19. Had extensive discussion about pt and family that pt's condition is improving, however, long term recovery requires adequate nutrition. Pt denies dysphagia. Denies depression and was unsure why he has lost interest in eating, mentions the texture is making uncomfortable. Unclear reason behind low-appetie but do suspect depression might have a role to play.  He is started on mirtazipine. After further discussion, family was open to GOC conversation  and wanted to talk to palliative care. Palliative care consulted.   -Coretrack on Monday 1/19 -Mirtazapine  15 mg nightly  -Palliative care consulted for GOC, appreciate recommendations    History of NSTEMI s/p DES to LAD (2008) History of ischemic cardiomyopathy History of HFmrEF Echo 2022: EF 45-50%. Repeat echo showed EF: 55-60 %, improved with regional wall motion abnormalities.     Plan:  -Holding home medications including carvedilol , lasix , losartan , aldactone    Hx of HTN  Increased pressor requirements as discussed above.  -holding home medications while on pressors and renal failure    DM CBGs are appropriate.  On SSI.  -Continue SSI  Patient is stable to be transferred out of ICU.  Discussed with nephrology and neurology they are agreeable to this transfer.  We will leave the central line in for electrolyte replacement this could be taken out as soon as patient is started to be fed with core track and tolerating it.  TRH to pick up 11/16/2024 PCCM will sign off.  Labs   CBC: Recent Labs  Lab 11/11/24 1230 11/11/24 1232 11/12/24 1813 11/13/24 0432 11/13/24 1657 11/14/24 0447 11/14/24 1639 11/15/24 0416  WBC 20.7*   < > 12.4* 13.9*  --  13.4* 15.1* 13.3*  NEUTROABS 18.9*  --   --   --   --   --   --   --  HGB 9.2*   < > 7.1* 6.9* 7.5* 7.9* 8.5* 8.5*  HCT 28.7*   < > 20.9* 20.0* 21.7* 22.8* 25.1* 24.7*  MCV 102.1*   < > 96.3 95.7  --  94.2 95.4 95.4  PLT 276   < > 220 193  --  173 167 172   < > = values in this interval not displayed.    Basic Metabolic Panel: Recent Labs  Lab 11/12/24 0500 11/12/24 1813 11/13/24 0432 11/13/24 1325 11/13/24 1626 11/14/24 0447 11/14/24 2000 11/15/24 0416  NA 132*   < > 132* 130* 132* 134* 134* 136  K 4.1   < > 3.6 3.6 3.6 3.5 4.2 3.8  CL 101   < > 96* 94* 95* 97* 99 100  CO2 15*   < > 21* 21* 22 21* 21* 21*  GLUCOSE 219*   < > 165* 154* 126* 116* 95 86  BUN 161*   < > 136* 132* 131* 129* 122* 122*  CREATININE  9.70*   < > 7.98* 7.61* 7.55* 7.26* 7.11* 6.86*  CALCIUM  7.5*   < > 7.2* 7.3* 7.4* 7.6* 7.6* 7.6*  MG 1.9  --  2.1  --   --  2.2  --  2.0  PHOS 4.0  --  4.0  --   --  4.9*  --  5.4*   < > = values in this interval not displayed.   GFR: Estimated Creatinine Clearance: 7.9 mL/min (A) (by C-G formula based on SCr of 6.86 mg/dL (H)). Recent Labs  Lab 11/11/24 1239 11/11/24 1437 11/11/24 1643 11/13/24 0432 11/14/24 0447 11/14/24 1639 11/15/24 0416  WBC  --   --    < > 13.9* 13.4* 15.1* 13.3*  LATICACIDVEN 1.4 1.2  --   --   --   --   --    < > = values in this interval not displayed.    Liver Function Tests: Recent Labs  Lab 11/11/24 1425 11/11/24 1643  AST 19 19  ALT 20 18  ALKPHOS 78 62  BILITOT 0.3 0.2  PROT 5.7* 4.9*  ALBUMIN 2.5* 2.2*   No results for input(s): LIPASE, AMYLASE in the last 168 hours. No results for input(s): AMMONIA in the last 168 hours.  ABG    Component Value Date/Time   HCO3 8.7 (L) 11/11/2024 1336   TCO2 9 (L) 11/11/2024 1336   ACIDBASEDEF 18.0 (H) 11/11/2024 1336   O2SAT 77 11/11/2024 1336     Coagulation Profile: Recent Labs  Lab 11/14/24 0447  INR 1.3*    Cardiac Enzymes: No results for input(s): CKTOTAL, CKMB, CKMBINDEX, TROPONINI in the last 168 hours.  HbA1C: Hgb A1c MFr Bld  Date/Time Value Ref Range Status  11/12/2024 05:00 AM 6.4 (H) 4.8 - 5.6 % Final    Comment:    (NOTE) Diagnosis of Diabetes The following HbA1c ranges recommended by the American Diabetes Association (ADA) may be used as an aid in the diagnosis of diabetes mellitus.  Hemoglobin             Suggested A1C NGSP%              Diagnosis  <5.7                   Non Diabetic  5.7-6.4                Pre-Diabetic  >6.4  Diabetic  <7.0                   Glycemic control for                       adults with diabetes.    04/11/2007 03:20 AM (H)  Final   8.8 (NOTE)   The ADA recommends the following therapeutic goals  for glycemic   control related to Hgb A1C measurement:   Goal of Therapy:   < 7.0% Hgb A1C   Action Suggested:  > 8.0% Hgb A1C   Ref:  Diabetes Care, 22, Suppl. 1, 1999  04/11/2007 03:20 AM (H)  Final   9.4 (NOTE)   The ADA recommends the following therapeutic goals for glycemic   control related to Hgb A1C measurement:   Goal of Therapy:   < 7.0% Hgb A1C   Action Suggested:  > 8.0% Hgb A1C   Ref:  Diabetes Care, 22, Suppl. 1, 1999    CBG: Recent Labs  Lab 11/14/24 1457 11/14/24 2106 11/15/24 0605 11/15/24 0707 11/15/24 1124  GLUCAP 104* 88 81 82 83    Review of Systems:   Per above  Past Medical History:  He,  has a past medical history of CHF (congestive heart failure) (HCC), Coronary artery disease (2008), CVA (cerebral vascular accident) (HCC) (2008), Diabetes mellitus, Edema, Heart murmur, Hyperlipidemia, Hypertension, Long term current use of anticoagulant, Mitral insufficiency, Myocardial infarction, old (2008), and Obesity.   Surgical History:   Past Surgical History:  Procedure Laterality Date   CORONARY ANGIOPLASTY WITH STENT PLACEMENT  04/10/2007   with drug-eluting stent placement of the mid LAD   KNEE SURGERY       Social History:   reports that he quit smoking about 49 years ago. His smoking use included cigarettes. He has never used smokeless tobacco. He reports that he does not drink alcohol and does not use drugs.   Family History:  His family history includes Dementia in his mother; Heart Problems in his father. There is no history of Coronary artery disease or Congestive Heart Failure.   Allergies Allergies[1]   Home Medications  Prior to Admission medications  Medication Sig Start Date End Date Taking? Authorizing Provider  aspirin  81 MG tablet Take 81 mg by mouth at bedtime.   Yes [provider]  atorvastatin  (LIPITOR) 80 MG tablet Take 1 tablet (80 mg total) by mouth daily. Patient taking differently: Take 80 mg by mouth at bedtime. 11/03/24   Yes Jordan, Peter M, MD  carvedilol  (COREG ) 12.5 MG tablet Take 1 tablet (12.5 mg total) by mouth 2 (two) times daily. 11/03/24 02/01/25 Yes Jordan, Peter M, MD  dapagliflozin propanediol (FARXIGA) 5 MG TABS tablet Take 5 mg by mouth at bedtime.   Yes [provider]  furosemide  (LASIX ) 20 MG tablet Take 1 tablet (20 mg total) by mouth daily. 11/03/24  Yes Jordan, Peter M, MD  losartan  (COZAAR ) 100 MG tablet Take 1 tablet (100 mg total) by mouth daily. 11/03/24  Yes Jordan, Peter M, MD  Lutein-Zeaxanthin (OCUVITE LUTEIN 25 PO) Take 1 capsule by mouth at bedtime.   Yes [provider]  Multiple Vitamins-Minerals (CENTRUM SILVER PO) Take 1 tablet by mouth daily.    Yes [provider]  nitroGLYCERIN  (NITROSTAT ) 0.4 MG SL tablet Place 1 tablet (0.4 mg total) under the tongue every 5 (five) minutes as needed for chest pain (x 3 doses). 11/03/24  Yes Jordan, Peter  M, MD  spironolactone  (ALDACTONE ) 25 MG tablet Take 1/2 tablet by mouth (12.5 mg) daily 11/03/24  Yes Jordan, Peter M, MD     Critical care time: NA    Tamela Stakes, MD  Attending Physician, Critical Care Medicine [facility] See Amion for pager If no response to pager, please call [phone] until 7pm After 7pm, Please call E-link [phone]              [1] No Known Allergies  "

## 2024-11-15 NOTE — Progress Notes (Signed)
 Admit: 11/11/2024 LOS: 4  89M with severe AKI (baseline GFR unclear), severe life threatening hyperkalmeia, metabolic acidosis.   Subjective:  Off pressors Renal ultrasound today showed persistent bilateral hydronephrosis Discussed with Dr. Watt, plan for ongoing close observation given good urine output and slow but steady improvement in BUN and creatinine Family at bedside, updated Urine has cleared  01/17 0701 - 01/18 0700 In: 4616.6 [I.V.:84.3; IV Piggyback:732.3] Out: 2675 [Urine:875]  Filed Weights   11/13/24 0437 11/14/24 0545 11/15/24 0500  Weight: 73.6 kg 75.5 kg 76.7 kg    Scheduled Meds:  Chlorhexidine  Gluconate Cloth  6 each Topical Daily   feeding supplement  237 mL Oral BID BM   heparin  injection (subcutaneous)  5,000 Units Subcutaneous Q8H   insulin  aspart  0-9 Units Subcutaneous TID WC   mirtazapine   15 mg Oral QHS   multivitamin  1 tablet Oral QHS   Continuous Infusions:  cefTRIAXone  (ROCEPHIN )  IV Stopped (11/14/24 1448)   norepinephrine  (LEVOPHED ) Adult infusion Stopped (11/14/24 0940)   vasopressin  Stopped (11/13/24 1348)   PRN Meds:.acetaminophen , polyethylene glycol, senna  Current Labs: reviewed  Blood and urine cultures from admission are pending  Physical Exam:  Blood pressure (!) 101/57, pulse 84, temperature 98.6 F (37 C), temperature source Axillary, resp. rate 16, height 5' 5 (1.651 m), weight 76.7 kg, SpO2 95%. GEN: Elderly male awake, NAD ENT: NCAT EYES: EOMI CV: Regular, no murmur or rub PULM: Clear throughout, normal work of breathing ABD: Soft nontender  SKIN: No significant lower extremity edema EXT: No LEE GU: Foley catheter with clear yellow urine at this time  A Severe AKI likely from obstructive uropathy; slow recovery; has not req RRT Hyperkalemia, Resolved Metabolic Acidosis, improved Hemorrhagic and purulent UTI with underlying progressive BPH and BOO; ceftriaxone  per CCM, culture data negative to date.  Now receiving  CBI with urology.  Repeat imaging 1/17 with residual bilateral hydronephrosis.  Plan to watch over the next 24 to 48 hours and if recovery of GFR stalls consider ureteral stents or PCN.  Hypotension / shock, likely from septic shock/hypovolemia; off pressors now Leukocytosis, WBC 21 at presentation, improved  Macrocytic anemia, worsened from presentation; PRBC 1/16  P Largely as above.  Continue supportive care.  Plan for repeat imaging no later than 1/20 to demonstrate status of hydronephrosis.  Would do sooner if kidney function recovery stalls out. No need for dialysis Medication Issues; Preferred narcotic agents for pain control are hydromorphone, fentanyl , and methadone. Morphine should not be used.  Baclofen should be avoided Avoid oral sodium phosphate and magnesium  citrate based laxatives / bowel preps    Bernardino Gasman MD 11/15/2024, 10:47 AM  Recent Labs  Lab 11/13/24 0432 11/13/24 1325 11/14/24 0447 11/14/24 2000 11/15/24 0416  NA 132*   < > 134* 134* 136  K 3.6   < > 3.5 4.2 3.8  CL 96*   < > 97* 99 100  CO2 21*   < > 21* 21* 21*  GLUCOSE 165*   < > 116* 95 86  BUN 136*   < > 129* 122* 122*  CREATININE 7.98*   < > 7.26* 7.11* 6.86*  CALCIUM  7.2*   < > 7.6* 7.6* 7.6*  PHOS 4.0  --  4.9*  --  5.4*   < > = values in this interval not displayed.   Recent Labs  Lab 11/11/24 1230 11/11/24 1232 11/14/24 0447 11/14/24 1639 11/15/24 0416  WBC 20.7*   < > 13.4* 15.1* 13.3*  NEUTROABS 18.9*  --   --   --   --   HGB 9.2*   < > 7.9* 8.5* 8.5*  HCT 28.7*   < > 22.8* 25.1* 24.7*  MCV 102.1*   < > 94.2 95.4 95.4  PLT 276   < > 173 167 172   < > = values in this interval not displayed.

## 2024-11-15 NOTE — Progress Notes (Addendum)
 eLink Physician-Brief Progress Note Patient Name: NORMAN PIACENTINI DOB: Aug 24, 1941 MRN: [id number]   Date of Service  11/15/2024  HPI/Events of Note  Feeling bloated, belching and gassy. Not wanting to take any medications.  Will not be taking night time PO meds.   eICU Interventions  Simethicone  PRN, no clear IV alternative.  Coretrack 1/19, meds by tube at that point   0001 - little relief from simethicon but abd still distended; had a bout of emesis and a bowel movement, nauseous.  Obtain KUB.  Add as needed antiemetics  Intervention Category Minor Interventions: Routine modifications to care plan (e.g. PRN medications for pain, fever)  Taelor Moncada 11/15/2024, 8:52 PM

## 2024-11-16 ENCOUNTER — Inpatient Hospital Stay (HOSPITAL_COMMUNITY)

## 2024-11-16 DIAGNOSIS — A419 Sepsis, unspecified organism: Principal | ICD-10-CM

## 2024-11-16 DIAGNOSIS — I959 Hypotension, unspecified: Secondary | ICD-10-CM

## 2024-11-16 DIAGNOSIS — E872 Acidosis, unspecified: Secondary | ICD-10-CM

## 2024-11-16 DIAGNOSIS — N179 Acute kidney failure, unspecified: Secondary | ICD-10-CM | POA: Diagnosis not present

## 2024-11-16 LAB — GLUCOSE, CAPILLARY
Glucose-Capillary: 76 mg/dL (ref 70–99)
Glucose-Capillary: 117 mg/dL — ABNORMAL HIGH (ref 70–99)
Glucose-Capillary: 132 mg/dL — ABNORMAL HIGH (ref 70–99)
Glucose-Capillary: 217 mg/dL — ABNORMAL HIGH (ref 70–99)

## 2024-11-16 LAB — CBC
HCT: 29.2 % — ABNORMAL LOW (ref 39.0–52.0)
Hemoglobin: 9.5 g/dL — ABNORMAL LOW (ref 13.0–17.0)
MCH: 32 pg (ref 26.0–34.0)
MCHC: 32.5 g/dL (ref 30.0–36.0)
MCV: 98.3 fL (ref 80.0–100.0)
Platelets: 214 K/uL (ref 150–400)
RBC: 2.97 MIL/uL — ABNORMAL LOW (ref 4.22–5.81)
RDW: 17 % — ABNORMAL HIGH (ref 11.5–15.5)
WBC: 15.5 K/uL — ABNORMAL HIGH (ref 4.0–10.5)
nRBC: 0 % (ref 0.0–0.2)

## 2024-11-16 LAB — CULTURE, BLOOD (ROUTINE X 2)
Culture: NO GROWTH
Culture: NO GROWTH
Special Requests: ADEQUATE

## 2024-11-16 LAB — PHOSPHORUS: Phosphorus: 6.3 mg/dL — ABNORMAL HIGH (ref 2.5–4.6)

## 2024-11-16 LAB — BASIC METABOLIC PANEL WITH GFR
Anion gap: 20 — ABNORMAL HIGH (ref 5–15)
BUN: 119 mg/dL — ABNORMAL HIGH (ref 8–23)
CO2: 18 mmol/L — ABNORMAL LOW (ref 22–32)
Calcium: 8.2 mg/dL — ABNORMAL LOW (ref 8.9–10.3)
Chloride: 102 mmol/L (ref 98–111)
Creatinine, Ser: 6.84 mg/dL — ABNORMAL HIGH (ref 0.61–1.24)
GFR, Estimated: 7 mL/min — ABNORMAL LOW
Glucose, Bld: 87 mg/dL (ref 70–99)
Potassium: 3.5 mmol/L (ref 3.5–5.1)
Sodium: 139 mmol/L (ref 135–145)

## 2024-11-16 LAB — MAGNESIUM: Magnesium: 2.1 mg/dL (ref 1.7–2.4)

## 2024-11-16 MED ORDER — BOOST / RESOURCE BREEZE PO LIQD CUSTOM
1.0000 | Freq: Two times a day (BID) | ORAL | Status: DC
  Administered 2024-11-16 – 2024-11-17 (×2): 1 via ORAL

## 2024-11-16 MED ORDER — ONDANSETRON HCL 4 MG/2ML IJ SOLN
4.0000 mg | Freq: Four times a day (QID) | INTRAMUSCULAR | Status: DC | PRN
  Administered 2024-11-18 – 2024-11-19 (×4): 4 mg via INTRAVENOUS
  Filled 2024-11-16 (×4): qty 2

## 2024-11-16 MED ORDER — PANTOPRAZOLE SODIUM 40 MG PO TBEC
40.0000 mg | DELAYED_RELEASE_TABLET | Freq: Two times a day (BID) | ORAL | Status: DC
  Administered 2024-11-16 – 2024-12-02 (×33): 40 mg via ORAL
  Filled 2024-11-16 (×33): qty 1

## 2024-11-16 NOTE — Progress Notes (Signed)
 "                        PROGRESS NOTE        PATIENT DETAILS Name: [name] Age: 84 y.o. Sex: male Date of Birth: 03/26/1941 Admit Date: 11/11/2024 Admitting Physician Lenny Drought, MD ERE:Xzmm, Reyes, MD  Brief Summary: Patient is a 84 y.o.  male with history of CAD s/p PCI 2008, HFmrEF, HTN, HLD-presented with generalized weakness/fatigue-loss of appetite-found to be hypotensive with AKI-thought to have obstructive uropathy.  Initially admitted to the ICU for pressors-stabilized and subsequently transferred to [facility].  Significant events: 1/14>> admit to ICU 1/19>> transferred to [facility].  Significant studies: 1/14>> CXR: No PNA 1/14>> CT head: No acute intracranial process 1/14>> CT abdomen/pelvis: Severe bilateral hydronephrosis 1/14>> renal ultrasound: Severe bilateral hydronephrosis. 1/15>> echo: EF 55-60% 1/17>> renal ultrasound: Severe bilateral hydronephrosis 1/19>> x-ray KUB: Dilated loops of small bowel-at least consistent with a partial SBO.  Significant microbiology data: 1/14>> COVID/influenza/RSV PCR: Negative 1/14>> blood culture: No growth 1/15>> urine culture: No growth  Procedures: None  Consults: PCCM Nephrology Neurology  Subjective: Lying comfortably in bed-denies any chest pain or shortness of breath.  Had some bloating/dyspeptic feeling last night-x-ray suggestive of partial small bowel obstruction but patient abdomen is not distended-he has had multiple BMs overnight.  No vomiting.  Objective: Vitals: Blood pressure (!) 107/48, pulse 94, temperature 97.8 F (36.6 C), temperature source Oral, resp. rate 12, height 5' 5 (1.651 m), weight 85.6 kg, SpO2 96%.   Exam: Gen Exam:Alert awake-not in any distress HEENT:atraumatic, normocephalic Chest: B/L clear to auscultation anteriorly CVS:S1S2 regular Abdomen:soft non tender, non distended Extremities:no edema Neurology: Non focal Skin: no rash  Pertinent Labs/Radiology:    Latest  Ref Rng & Units 11/16/2024    7:53 AM 11/15/2024    4:59 PM 11/15/2024    4:16 AM  CBC  WBC 4.0 - 10.5 K/uL 15.5  20.0  13.3   Hemoglobin 13.0 - 17.0 g/dL 9.5  9.9  8.5   Hematocrit 39.0 - 52.0 % 29.2  29.4  24.7   Platelets 150 - 400 K/uL 214  200  172     Lab Results  Component Value Date   NA 139 11/16/2024   K 3.5 11/16/2024   CL 102 11/16/2024   CO2 18 (L) 11/16/2024     Assessment/Plan: AKI Secondary to obstructive uropathy Although renal function improved-it seems to have plateaued over the past several days Foley catheter in place Nephrology/urology following-await further recommendations  Hyperkalemia Metabolic acidosis Secondary to AKI Resolved/improved Follow electrolytes periodically  Multifactorial shock Secondary to septic shock due to UTI and hypovolemic shock from poor oral intake BP has stabilized with supportive care Briefly required pressors on initial presentation  Complicated UTI Continue Rocephin -although urine cultures negative-this was done after patient was given antibiotics  ?  Partial SBO Clinically does not appear to have any signs of obstruction-had multiple bowel movements overnight including this morning-abdomen is soft-no distention-no vomiting. Monitor/supportive care  GERD Has been having symptoms of dyspepsia/flatulence since yesterday Starting scheduled PPI As needed simethicone   Normocytic anemia Likely secondary to critical illness Follow CBC  History of CAD s/p PCI 2008 No anginal symptoms  Chronic HFmrEF Euvolemic Diuretics on hold  HTN Presented with hypotension-all antihypertensives on hold-resume when able  DM-2 (A1c 6.4 on 1/15) CBG stable-SSI  Anorexia/failure to thrive syndrome Poor oral intake for the past several months Patient not keen on getting NG tube  placed Encourage oral intake-if poor oral intake persists-May need NG tube/core track tube placement Palliative care following Continue  Remeron   Nutrition Status: Nutrition Problem: Moderate Malnutrition Etiology: acute illness Signs/Symptoms: mild fat depletion, mild muscle depletion Interventions: Refer to RD note for recommendations  Pressure Ulcer: Agree with assessment as outlined below Wound 11/11/24 1850 Pressure Injury Coccyx Mid Stage 2 -  Partial thickness loss of dermis presenting as a shallow open injury with a red, pink wound bed without slough. (Active)     Wound 11/11/24 1850 Pressure Injury Back Mid;Upper Deep Tissue Pressure Injury - Purple or maroon localized area of discolored intact skin or blood-filled blister due to damage of underlying soft tissue from pressure and/or shear. (Active)     Wound 11/11/24 1850 Pressure Injury Head Posterior;Medial Deep Tissue Pressure Injury - Purple or maroon localized area of discolored intact skin or blood-filled blister due to damage of underlying soft tissue from pressure and/or shear. (Active)    Class 1 Obesity: Estimated body mass index is 31.4 kg/m as calculated from the following:   Height as of this encounter: 5' 5 (1.651 m).   Weight as of this encounter: 85.6 kg.   Code status:   Code Status: Full Code   DVT Prophylaxis: heparin  injection 5,000 Units Start: 11/14/24 1800 SCDs Start: 11/11/24 1516   Family Communication: Daughter/Son at bedside   Disposition Plan: Status is: Inpatient Remains inpatient appropriate because: Severity of illness   Planned Discharge Destination:Skilled nursing facility   Diet: Diet Order             Diet regular Room service appropriate? Yes; Fluid consistency: Thin; Fluid restriction: 1200 mL Fluid  Diet effective now                     Antimicrobial agents: Anti-infectives (From admission, onward)    Start     Dose/Rate Route Frequency Ordered Stop   11/13/24 1400  cefTRIAXone  (ROCEPHIN ) 1 g in sodium chloride  0.9 % 100 mL IVPB        1 g 200 mL/hr over 30 Minutes Intravenous Every 24 hours  11/13/24 0935 11/17/24 2359   11/12/24 1400  ceFEPIme  (MAXIPIME ) 1 g in sodium chloride  0.9 % 100 mL IVPB  Status:  Discontinued        1 g 200 mL/hr over 30 Minutes Intravenous Every 24 hours 11/12/24 0824 11/13/24 0935   11/12/24 0800  cefTRIAXone  (ROCEPHIN ) 2 g in sodium chloride  0.9 % 100 mL IVPB  Status:  Discontinued        2 g 200 mL/hr over 30 Minutes Intravenous Every 24 hours 11/11/24 1531 11/12/24 0824   11/11/24 1345  vancomycin  (VANCOREADY) IVPB 1500 mg/300 mL        1,500 mg 150 mL/hr over 120 Minutes Intravenous  Once 11/11/24 1332 11/11/24 1702   11/11/24 1330  metroNIDAZOLE  (FLAGYL ) IVPB 500 mg        500 mg 100 mL/hr over 60 Minutes Intravenous  Once 11/11/24 1321 11/11/24 1558   11/11/24 1330  ceFEPIme  (MAXIPIME ) 2 g in sodium chloride  0.9 % 100 mL IVPB        2 g 200 mL/hr over 30 Minutes Intravenous  Once 11/11/24 1332 11/11/24 1457        MEDICATIONS: Scheduled Meds:  Chlorhexidine  Gluconate Cloth  6 each Topical Daily   feeding supplement  237 mL Oral BID BM   heparin  injection (subcutaneous)  5,000 Units Subcutaneous Q8H   insulin  aspart  0-9 Units Subcutaneous TID WC   mirtazapine   15 mg Oral QHS   multivitamin  1 tablet Oral QHS   pantoprazole   40 mg Oral BID   Continuous Infusions:  cefTRIAXone  (ROCEPHIN )  IV 1 g (11/15/24 1551)   PRN Meds:.acetaminophen , ondansetron  (ZOFRAN ) IV, polyethylene glycol, senna, simethicone    I have personally reviewed following labs and imaging studies  LABORATORY DATA: CBC: Recent Labs  Lab 11/11/24 1230 11/11/24 1232 11/14/24 0447 11/14/24 1639 11/15/24 0416 11/15/24 1659 11/16/24 0753  WBC 20.7*   < > 13.4* 15.1* 13.3* 20.0* 15.5*  NEUTROABS 18.9*  --   --   --   --   --   --   HGB 9.2*   < > 7.9* 8.5* 8.5* 9.9* 9.5*  HCT 28.7*   < > 22.8* 25.1* 24.7* 29.4* 29.2*  MCV 102.1*   < > 94.2 95.4 95.4 98.0 98.3  PLT 276   < > 173 167 172 200 214   < > = values in this interval not displayed.    Basic  Metabolic Panel: Recent Labs  Lab 11/12/24 0500 11/12/24 1813 11/13/24 0432 11/13/24 1325 11/14/24 0447 11/14/24 2000 11/15/24 0416 11/15/24 1659 11/16/24 0753  NA 132*   < > 132*   < > 134* 134* 136 140 139  K 4.1   < > 3.6   < > 3.5 4.2 3.8 4.0 3.5  CL 101   < > 96*   < > 97* 99 100 101 102  CO2 15*   < > 21*   < > 21* 21* 21* 16* 18*  GLUCOSE 219*   < > 165*   < > 116* 95 86 84 87  BUN 161*   < > 136*   < > 129* 122* 122* 118* 119*  CREATININE 9.70*   < > 7.98*   < > 7.26* 7.11* 6.86* 6.74* 6.84*  CALCIUM  7.5*   < > 7.2*   < > 7.6* 7.6* 7.6* 8.0* 8.2*  MG 1.9  --  2.1  --  2.2  --  2.0  --  2.1  PHOS 4.0  --  4.0  --  4.9*  --  5.4*  --  6.3*   < > = values in this interval not displayed.    GFR: Estimated Creatinine Clearance: 8.4 mL/min (A) (by C-G formula based on SCr of 6.84 mg/dL (H)).  Liver Function Tests: Recent Labs  Lab 11/11/24 1425 11/11/24 1643  AST 19 19  ALT 20 18  ALKPHOS 78 62  BILITOT 0.3 0.2  PROT 5.7* 4.9*  ALBUMIN 2.5* 2.2*   No results for input(s): LIPASE, AMYLASE in the last 168 hours. No results for input(s): AMMONIA in the last 168 hours.  Coagulation Profile: Recent Labs  Lab 11/14/24 0447  INR 1.3*    Cardiac Enzymes: No results for input(s): CKTOTAL, CKMB, CKMBINDEX, TROPONINI in the last 168 hours.  BNP (last 3 results) Recent Labs    11/11/24 1230  PROBNP 4,787.0*    Lipid Profile: No results for input(s): CHOL, HDL, LDLCALC, TRIG, CHOLHDL, LDLDIRECT in the last 72 hours.  Thyroid  Function Tests: No results for input(s): TSH, T4TOTAL, FREET4, T3FREE, THYROIDAB in the last 72 hours.  Anemia Panel: No results for input(s): VITAMINB12, FOLATE, FERRITIN, TIBC, IRON, RETICCTPCT in the last 72 hours.  Urine analysis:    Component Value Date/Time   COLORURINE AMBER (A) 11/11/2024 1222   APPEARANCEUR TURBID (A) 11/11/2024 1222   LABSPEC 1.010 11/11/2024  1222   PHURINE  5.0 11/11/2024 1222   GLUCOSEU NEGATIVE 11/11/2024 1222   HGBUR LARGE (A) 11/11/2024 1222   BILIRUBINUR NEGATIVE 11/11/2024 1222   KETONESUR NEGATIVE 11/11/2024 1222   PROTEINUR >=300 (A) 11/11/2024 1222   NITRITE NEGATIVE 11/11/2024 1222   LEUKOCYTESUR LARGE (A) 11/11/2024 1222    Sepsis Labs: Lactic Acid, Venous    Component Value Date/Time   LATICACIDVEN 1.2 11/11/2024 1437    MICROBIOLOGY: Recent Results (from the past 240 hours)  Resp panel by RT-PCR (RSV, Flu A&B, Covid) Anterior Nasal Swab     Status: None   Collection Time: 11/11/24 12:23 PM   Specimen: Anterior Nasal Swab  Result Value Ref Range Status   SARS Coronavirus 2 by RT PCR NEGATIVE NEGATIVE Final   Influenza A by PCR NEGATIVE NEGATIVE Final   Influenza B by PCR NEGATIVE NEGATIVE Final    Comment: (NOTE) The Xpert Xpress SARS-CoV-2/FLU/RSV plus assay is intended as an aid in the diagnosis of influenza from Nasopharyngeal swab specimens and should not be used as a sole basis for treatment. Nasal washings and aspirates are unacceptable for Xpert Xpress SARS-CoV-2/FLU/RSV testing.  Fact Sheet for Patients: bloggercourse.com  Fact Sheet for Healthcare Providers: seriousbroker.it  This test is not yet approved or cleared by the [place]  FDA and has been authorized for detection and/or diagnosis of SARS-CoV-2 by FDA under an Emergency Use Authorization (EUA). This EUA will remain in effect (meaning this test can be used) for the duration of the COVID-19 declaration under Section 564(b)(1) of the Act, 21 U.S.C. section 360bbb-3(b)(1), unless the authorization is terminated or revoked.     Resp Syncytial Virus by PCR NEGATIVE NEGATIVE Final    Comment: (NOTE) Fact Sheet for Patients: bloggercourse.com  Fact Sheet for Healthcare Providers: seriousbroker.it  This test is not yet approved or cleared by  the [place]  FDA and has been authorized for detection and/or diagnosis of SARS-CoV-2 by FDA under an Emergency Use Authorization (EUA). This EUA will remain in effect (meaning this test can be used) for the duration of the COVID-19 declaration under Section 564(b)(1) of the Act, 21 U.S.C. section 360bbb-3(b)(1), unless the authorization is terminated or revoked.  Performed at [facility], [address], [place], [name] [address]   Blood culture (routine x 2)     Status: None   Collection Time: 11/11/24  2:20 PM   Specimen: BLOOD RIGHT WRIST  Result Value Ref Range Status   Specimen Description BLOOD RIGHT WRIST  Final   Special Requests   Final    BOTTLES DRAWN AEROBIC AND ANAEROBIC Blood Culture results may not be optimal due to an inadequate volume of blood received in culture bottles   Culture   Final    NO GROWTH 5 DAYS Performed at [facility], [address] [place], [name] [address]    Report Status 11/16/2024 FINAL  Final  Blood culture (routine x 2)     Status: None   Collection Time: 11/11/24  2:25 PM   Specimen: BLOOD  Result Value Ref Range Status   Specimen Description BLOOD RIGHT ANTECUBITAL  Final   Special Requests   Final    BOTTLES DRAWN AEROBIC AND ANAEROBIC Blood Culture adequate volume   Culture   Final    NO GROWTH 5 DAYS Performed at [facility], [address], [place], [name] [address]    Report Status 11/16/2024 FINAL  Final  MRSA Next Gen by PCR, Nasal  Status: None   Collection Time: 11/11/24  6:55 PM   Specimen: Nasal Mucosa; Nasal Swab  Result Value Ref Range Status   MRSA by PCR Next Gen NOT DETECTED NOT DETECTED Final    Comment: (NOTE) The GeneXpert MRSA Assay (FDA approved for NASAL specimens only), is one component of a comprehensive MRSA colonization surveillance program. It is not intended to diagnose MRSA infection nor to guide or monitor treatment for MRSA infections. Test performance is not FDA  approved in patients less than 65 years old. Performed at [facility], [address], [place], [name] [address]   Urine Culture (for pregnant, neutropenic or urologic patients or patients with an indwelling urinary catheter)     Status: None   Collection Time: 11/12/24  9:01 AM   Specimen: Urine, Catheterized  Result Value Ref Range Status   Specimen Description URINE, CATHETERIZED  Final   Special Requests NONE  Final   Culture   Final    NO GROWTH Performed at [facility], [address] [place], [name] [address]    Report Status 11/13/2024 FINAL  Final    RADIOLOGY STUDIES/RESULTS: DG Abd 1 View Result Date: 11/16/2024 EXAM: 1 VIEW XRAY OF THE ABDOMEN 11/16/2024 12:32:00 AM COMPARISON: 11/11/2024 CT CLINICAL HISTORY: Distended abdomen. FINDINGS: BOWEL: Scattered large and small bowel gas is noted. Dilatation of the small bowel is noted consistent with at least a partial small bowel obstruction. No definitive free air is seen. SOFT TISSUES: No abnormal calcifications. BONES: Degenerative changes of the lumbar spine are noted. No acute fracture. IMPRESSION: 1. Dilatation of the small bowel consistent with at least a partial small bowel obstruction. These findings are new from the prior CT examination. 2. No definitive free air. Electronically signed by: Oneil Devonshire MD 11/16/2024 12:38 AM EST RP Workstation: [id number]     LOS: 5 days   Donalda Applebaum, MD  [facility]    To contact the attending provider between 7A-7P or the covering provider during after hours 7P-7A, please log into the web site [email or web address] and access using universal [place] password for that web site. If you do not have the password, please call the hospital operator.  11/16/2024, 10:59 AM    "

## 2024-11-16 NOTE — Evaluation (Signed)
 Occupational Therapy Evaluation Patient Details Name: [name] MRN: [id number] DOB: Dec 31, 1940 Today's Date: 11/16/2024   History of Present Illness   84 y.o. male admitted 11/11/24 with weakness, fatigue, decreased appetite, decreased urine output. Workup for severe AKI, septic shock secondary to UTI, severe bilateral hydronephrosis. Transfer out of ICU 1/18. Imaging 1/19 consistent with partial SBO. PMH includes HTN, HLD, DM, NSTEMI, ischemic cardiomyopathy, HF, CVA, heart murmur.     Clinical Impressions PTA, pt lived with son and reports being mod I with BADL, washing dishes after dinner, running errands and driving until 1/5 weeks ago. Upon eval, pt presents with decreased activity tolerance, generalized weakness, decreased balance. Pt needing up to mod A for LB ADL and min A for transfers this session. Pt with limited OOB standing tolerance due to weakness. Recommending inpatient rehab <3 hours/day.      If plan is discharge home, recommend the following:   A little help with walking and/or transfers;A little help with bathing/dressing/bathroom;Assistance with cooking/housework;Assist for transportation;Help with stairs or ramp for entrance     Functional Status Assessment   Patient has had a recent decline in their functional status and demonstrates the ability to make significant improvements in function in a reasonable and predictable amount of time.     Equipment Recommendations   Other (comment);BSC/3in1 (rollator)     Recommendations for Other Services   Rehab consult     Precautions/Restrictions   Precautions Precautions: Fall Recall of Precautions/Restrictions: Intact Restrictions Weight Bearing Restrictions Per Provider Order: No     Mobility Bed Mobility Overal bed mobility: Needs Assistance Bed Mobility: Supine to Sit, Sit to Sidelying, Rolling Rolling: Contact guard assist   Supine to sit: Supervision, HOB elevated, Used rails   Sit to  sidelying: Min assist General bed mobility comments: assist to bring BLE back into bed    Transfers Overall transfer level: Needs assistance Equipment used: Rolling walker (2 wheels) Transfers: Sit to/from Stand, Bed to chair/wheelchair/BSC Sit to Stand: Min assist     Step pivot transfers: Contact guard assist     General transfer comment: repeated cues for hand placement      Balance Overall balance assessment: Needs assistance Sitting-balance support: No upper extremity supported, Feet supported Sitting balance-Leahy Scale: Good Sitting balance - Comments: tolerated prolonged sitting EOB   Standing balance support: No upper extremity supported, During functional activity Standing balance-Leahy Scale: Fair                             ADL either performed or assessed with clinical judgement   ADL Overall ADL's : Needs assistance/impaired Eating/Feeding: Set up;Sitting   Grooming: Set up;Sitting   Upper Body Bathing: Set up;Sitting   Lower Body Bathing: Minimal assistance;Sit to/from stand   Upper Body Dressing : Set up;Sitting   Lower Body Dressing: Moderate assistance;Sit to/from stand Lower Body Dressing Details (indicate cue type and reason): socks; needing increased time overall and assist with R sock Toilet Transfer: Stand-pivot;Rollator (4 wheels)   Toileting- Clothing Manipulation and Hygiene: Total assistance;Sit to/from stand Toileting - Clothing Manipulation Details (indicate cue type and reason): incontient     Functional mobility during ADLs: Minimal assistance;Rollator (4 wheels)       Vision         Perception         Praxis         Pertinent Vitals/Pain Pain Assessment Pain Assessment: Faces Faces Pain Scale: No hurt  Pain Intervention(s): Monitored during session     Extremity/Trunk Assessment Upper Extremity Assessment Upper Extremity Assessment: Generalized weakness   Lower Extremity Assessment Lower Extremity  Assessment: Defer to PT evaluation       Communication Communication Communication: No apparent difficulties   Cognition Arousal: Alert Behavior During Therapy: WFL for tasks assessed/performed Cognition: No apparent impairments             OT - Cognition Comments: hard of hearing impacting cognitive assessment, however, pleasant, converdational, following commands during session                 Following commands: Intact Following commands impaired: Only follows one step commands consistently, Follows multi-step commands with increased time     Cueing  General Comments   Cueing Techniques: Verbal cues;Gestural cues  HR 90s-130   Exercises     Shoulder Instructions      Home Living Family/patient expects to be discharged to:: Private residence Living Arrangements: Children Available Help at Discharge: Family;Available PRN/intermittently Type of Home: House Home Access: Stairs to enter Entrance Stairs-Number of Steps: 3 + 1 Entrance Stairs-Rails: Right Home Layout: One level     Bathroom Shower/Tub: Chief Strategy Officer: Standard     Home Equipment: Cane - single point;Grab bars - tub/shower   Additional Comments: lives with son who works part-time      Prior Functioning/Environment Prior Level of Function : Independent/Modified Independent;Driving             Mobility Comments: mod indep with SPC, drives himself to appts, primarily sedentary; retired from work ADLs Comments: indep ADLs, stands to shower; son does majority of cooking and household tasks    OT Problem List: Decreased strength;Decreased activity tolerance;Impaired balance (sitting and/or standing);Decreased safety awareness   OT Treatment/Interventions: Self-care/ADL training;Therapeutic exercise;DME and/or AE instruction;Therapeutic activities;Patient/family education;Balance training      OT Goals(Current goals can be found in the care plan section)   Acute  Rehab OT Goals Patient Stated Goal: get better OT Goal Formulation: With patient Time For Goal Achievement: 11/30/24 Potential to Achieve Goals: Good   OT Frequency:  Min 2X/week    Co-evaluation              AM-PAC OT 6 Clicks Daily Activity     Outcome Measure Help from another person eating meals?: A Little Help from another person taking care of personal grooming?: A Little Help from another person toileting, which includes using toliet, bedpan, or urinal?: A Lot Help from another person bathing (including washing, rinsing, drying)?: A Little Help from another person to put on and taking off regular upper body clothing?: A Little Help from another person to put on and taking off regular lower body clothing?: A Lot 6 Click Score: 16   End of Session Equipment Utilized During Treatment: Gait belt;Rollator (4 wheels) Nurse Communication: Mobility status;Other (comment) (weepy BM, cleaned up but likely to need to be cleaned up again soon)  Activity Tolerance: Patient tolerated treatment well Patient left: in bed;with call bell/phone within reach;with bed alarm set;with family/visitor present  OT Visit Diagnosis: Unsteadiness on feet (R26.81);Muscle weakness (generalized) (M62.81);Other symptoms and signs involving cognitive function                Time: 8388-8354 OT Time Calculation (min): 34 min Charges:  OT General Charges $OT Visit: 1 Visit OT Evaluation $OT Eval Moderate Complexity: 1 Mod OT Treatments $Self Care/Home Management : 8-22 mins  Elma JONETTA Penner, OTD, OTR/L  [facility] Acute Rehabilitation Office: [phone]   Elma JONETTA Penner 11/16/2024, 5:19 PM

## 2024-11-16 NOTE — Evaluation (Signed)
 Physical Therapy Evaluation Patient Details Name: [name] MRN: [id number] DOB: June 22, 1941 Today's Date: 11/16/2024  History of Present Illness  84 y.o. male admitted 11/11/24 with weakness, fatigue, decreased appetite, decreased urine output. Workup for severe AKI, septic shock secondary to UTI, severe bilateral hydronephrosis. Transfer out of ICU 1/18. Imaging 1/19 consistent with partial SBO. PMH includes HTN, HLD, DM, NSTEMI, ischemic cardiomyopathy, HF, CVA, heart murmur.   Clinical Impression  Pt presents with an overall decrease in functional mobility secondary to above. PTA, pt mod indep with SPC, mod indep ADLs, drives, lives with son who assists with iADLs as needed. Today, pt able to initiate transfer and gait training with RW and CGA. Pt limited by generalized weakness, decreased activity tolerance, poor balance strategies, impaired cognition. Pending progress, may require post-acute rehab services (< 3 hrs/day) to maximize functional mobility and independence prior to d/c home.         If plan is discharge home, recommend the following: A little help with walking and/or transfers;A little help with bathing/dressing/bathroom;Assistance with cooking/housework;Assist for transportation;Help with stairs or ramp for entrance   Can travel by private vehicle   Yes    Equipment Recommendations  (TBD - potential RW vs rollator)  Recommendations for Other Services  OT consult    Functional Status Assessment Patient has had a recent decline in their functional status and demonstrates the ability to make significant improvements in function in a reasonable and predictable amount of time.     Precautions / Restrictions Precautions Precautions: Fall Recall of Precautions/Restrictions: Intact Restrictions Weight Bearing Restrictions Per Provider Order: No      Mobility  Bed Mobility Overal bed mobility: Needs Assistance Bed Mobility: Supine to Sit     Supine to sit:  Supervision, HOB elevated, Used rails     General bed mobility comments: pt coming to long sitting with bed rails without, laying self back then scooting towards EOB; repeated cues to stay on task    Transfers Overall transfer level: Needs assistance Equipment used: Rolling walker (2 wheels) Transfers: Sit to/from Stand, Bed to chair/wheelchair/BSC Sit to Stand: Contact guard assist   Step pivot transfers: Contact guard assist       General transfer comment: repeated cues and max encouragement to complete tasks; pt states, I may just stand up halfway and sit back down, my legs don't feel they can support me; able to stand and take steps to recliner with RW and CGA    Ambulation/Gait             Pre-gait activities: pt tolerated standing ~90 secs, including marching in place and step pivot to recliner, then static standing before requesting to sit down due to BLE fatigue, declining further ambulation distance    Stairs            Wheelchair Mobility     Tilt Bed    Modified Rankin (Stroke Patients Only)       Balance Overall balance assessment: Needs assistance Sitting-balance support: No upper extremity supported, Feet supported Sitting balance-Leahy Scale: Good Sitting balance - Comments: tolerated prolonged sitting EOB   Standing balance support: No upper extremity supported, During functional activity Standing balance-Leahy Scale: Fair Standing balance comment: can static stand without UE support, stability and confidence improved with RW                             Pertinent Vitals/Pain Pain Assessment Pain Assessment: Faces Faces  Pain Scale: No hurt Pain Intervention(s): Monitored during session    Home Living Family/patient expects to be discharged to:: Private residence Living Arrangements: Children Available Help at Discharge: Family;Available PRN/intermittently Type of Home: House Home Access: Stairs to enter Entrance  Stairs-Rails: Right Entrance Stairs-Number of Steps: 3 + 1   Home Layout: One level Home Equipment: Cane - single point;Grab bars - tub/shower Additional Comments: lives with son who works part-time    Prior Function Prior Level of Function : Independent/Modified Independent;Driving             Mobility Comments: mod indep with SPC, drives himself to appts, primarily sedentary; retired from work ADLs Comments: indep ADLs, stands to shower; son does majority of cooking and household tasks     Extremity/Trunk Assessment   Upper Extremity Assessment Upper Extremity Assessment: Generalized weakness    Lower Extremity Assessment Lower Extremity Assessment: Generalized weakness       Communication   Communication Communication: No apparent difficulties    Cognition Arousal: Alert Behavior During Therapy: WFL for tasks assessed/performed   PT - Cognitive impairments: Awareness, Attention, Sequencing, Problem solving                       PT - Cognition Comments: requires repeated cues to complete tasks, suspect more related to nervousness about first time geting up since admission Following commands: Intact Following commands impaired: Only follows one step commands consistently, Follows multi-step commands with increased time     Cueing Cueing Techniques: Verbal cues, Gestural cues     General Comments General comments (skin integrity, edema, etc.): HR 90s-120s with activity, post-mobility BP 123/64. pt's daughter and son present, supportive. educ re: role of acute PT, POC, activity recommendations, importance of OOB mobility, potential d/c needs    Exercises     Assessment/Plan    PT Assessment Patient needs continued PT services  PT Problem List Decreased strength;Decreased activity tolerance;Decreased balance;Decreased mobility;Decreased cognition;Decreased knowledge of use of DME;Cardiopulmonary status limiting activity       PT Treatment  Interventions DME instruction;Gait training;Stair training;Functional mobility training;Therapeutic activities;Therapeutic exercise;Balance training;Patient/family education    PT Goals (Current goals can be found in the Care Plan section)  Acute Rehab PT Goals Patient Stated Goal: return home; children interested in SNF rehab PT Goal Formulation: With patient Time For Goal Achievement: 11/30/24 Potential to Achieve Goals: Good    Frequency Min 3X/week     Co-evaluation               AM-PAC PT 6 Clicks Mobility  Outcome Measure Help needed turning from your back to your side while in a flat bed without using bedrails?: A Little Help needed moving from lying on your back to sitting on the side of a flat bed without using bedrails?: A Little Help needed moving to and from a bed to a chair (including a wheelchair)?: A Little Help needed standing up from a chair using your arms (e.g., wheelchair or bedside chair)?: A Little Help needed to walk in hospital room?: Total Help needed climbing 3-5 steps with a railing? : Total 6 Click Score: 14    End of Session Equipment Utilized During Treatment: Gait belt Activity Tolerance: Patient tolerated treatment well;Patient limited by fatigue Patient left: in chair;with call bell/phone within reach;with chair alarm set Nurse Communication: Mobility status PT Visit Diagnosis: Other abnormalities of gait and mobility (R26.89);Muscle weakness (generalized) (M62.81)    Time: 9057-8987 PT Time Calculation (min) (ACUTE ONLY): 30  min   Charges:   PT Evaluation $PT Eval Moderate Complexity: 1 Mod PT Treatments $Therapeutic Activity: 8-22 mins PT General Charges $$ ACUTE PT VISIT: 1 Visit       Darice Almas, PT, DPT Acute Rehabilitation Services  Personal: Secure Chat Rehab Office: [phone]  Darice LITTIE Almas 11/16/2024, 11:36 AM

## 2024-11-16 NOTE — TOC Progression Note (Signed)
 Transition of Care [facility]) - Progression Note    Patient Details  Name: [name] MRN: [id number] Date of Birth: [birth year]  Transition of [facility]) CM/SW Contact  Inocente GORMAN Kindle, LCSW Phone Number: 11/16/2024, 3:58 PM  Clinical Narrative:    Patient transferred to 5W. CSW following for SNF consult post palliative conversation.      Barriers to Discharge: Continued Medical Work up               Expected Discharge Plan and Services In-house Referral: Clinical Social Work     Living arrangements for the past 2 months: Single Family Home                                       Social Drivers of Health (SDOH) Interventions SDOH Screenings   Food Insecurity: No Food Insecurity (11/11/2024)  Housing: Low Risk (11/11/2024)  Transportation Needs: No Transportation Needs (11/11/2024)  Utilities: Not At Risk (11/11/2024)  Social Connections: Moderately Integrated (11/11/2024)  Tobacco Use: Medium Risk (11/11/2024)    Readmission Risk Interventions     No data to display

## 2024-11-16 NOTE — Care Management Important Message (Signed)
 Important Message  Patient Details  Name: [name] MRN: [id number] Date of Birth: Mar 23, 1941   Important Message Given:  Yes - Medicare IM     Claretta Deed 11/16/2024, 4:08 PM

## 2024-11-16 NOTE — Progress Notes (Signed)
 Patient ID: [name], male   DOB: [birth year], 84 y.o.   MRN: [id number] [facility] Progress Note   Assessment/ Plan:   1. Acute kidney Injury: Likely secondary to obstruction/obstructive uropathy.  Nonoliguric overnight with essentially unchanged renal function/absence of additional renal recovery seen on labs.  Hyperkalemia resolved and metabolic acidosis slightly improved.  I suspect he will need cystoscopy with ureteral stents versus PCN based on absence of renal recovery and persistent hydronephrosis at this time (raising concern for obstruction of ureteral orifices at posterior bladder wall).  No acute indication noted for dialysis. 2.  Urinary tract infection: With progressive BPH/bladder outlet obstruction.  On empiric antibiotic coverage with ceftriaxone  with urine cultures negative to date.  Will likely need additional intervention with recent imaging showing residual bilateral hydronephrosis and renal function not improving. 3.  Anemia: Likely secondary to chronic illness.  Status post PRBC transfusion 11/13/2024 and without overt blood loss. 4.  Leukocytosis: Improving slowly on empiric ceftriaxone  therapy.  Urine cultures negative to date.  Subjective:   Without acute events overnight   Objective:   BP (!) 107/48 (BP Location: Left Arm)   Pulse 94   Temp 97.8 F (36.6 C) (Oral)   Resp 12   Ht 5' 5 (1.651 m)   Wt 85.6 kg   SpO2 96%   BMI 31.40 kg/m   Intake/Output Summary (Last 24 hours) at 11/16/2024 9061 Last data filed at 11/15/2024 1800 Gross per 24 hour  Intake 2100 ml  Output 1800 ml  Net 300 ml   Weight change: 8.9 kg  Physical Exam: Gen: Comfortably resting in bed, family at bedside CVS: Pulse regular rhythm, normal rate, S1 and S2 normal Resp: Clear to auscultation, no rales/rhonchi Abd: Soft, flat, nontender, bowel sounds normal Ext: No lower extremity edema  Imaging: DG Abd 1 View Result Date: 11/16/2024 EXAM: 1 VIEW XRAY OF THE  ABDOMEN 11/16/2024 12:32:00 AM COMPARISON: 11/11/2024 CT CLINICAL HISTORY: Distended abdomen. FINDINGS: BOWEL: Scattered large and small bowel gas is noted. Dilatation of the small bowel is noted consistent with at least a partial small bowel obstruction. No definitive free air is seen. SOFT TISSUES: No abnormal calcifications. BONES: Degenerative changes of the lumbar spine are noted. No acute fracture. IMPRESSION: 1. Dilatation of the small bowel consistent with at least a partial small bowel obstruction. These findings are new from the prior CT examination. 2. No definitive free air. Electronically signed by: Oneil Devonshire MD 11/16/2024 12:38 AM EST RP Workstation: [id number]    Labs: BMET Recent Labs  Lab 11/12/24 0500 11/12/24 1813 11/13/24 0432 11/13/24 1325 11/13/24 1626 11/14/24 0447 11/14/24 2000 11/15/24 0416 11/15/24 1659 11/16/24 0753  NA 132*   < > 132* 130* 132* 134* 134* 136 140 139  K 4.1   < > 3.6 3.6 3.6 3.5 4.2 3.8 4.0 3.5  CL 101   < > 96* 94* 95* 97* 99 100 101 102  CO2 15*   < > 21* 21* 22 21* 21* 21* 16* 18*  GLUCOSE 219*   < > 165* 154* 126* 116* 95 86 84 87  BUN 161*   < > 136* 132* 131* 129* 122* 122* 118* 119*  CREATININE 9.70*   < > 7.98* 7.61* 7.55* 7.26* 7.11* 6.86* 6.74* 6.84*  CALCIUM  7.5*   < > 7.2* 7.3* 7.4* 7.6* 7.6* 7.6* 8.0* 8.2*  PHOS 4.0  --  4.0  --   --  4.9*  --  5.4*  --  6.3*   < > =  values in this interval not displayed.   CBC Recent Labs  Lab 11/11/24 1230 11/11/24 1232 11/14/24 1639 11/15/24 0416 11/15/24 1659 11/16/24 0753  WBC 20.7*   < > 15.1* 13.3* 20.0* 15.5*  NEUTROABS 18.9*  --   --   --   --   --   HGB 9.2*   < > 8.5* 8.5* 9.9* 9.5*  HCT 28.7*   < > 25.1* 24.7* 29.4* 29.2*  MCV 102.1*   < > 95.4 95.4 98.0 98.3  PLT 276   < > 167 172 200 214   < > = values in this interval not displayed.    Medications:     Chlorhexidine  Gluconate Cloth  6 each Topical Daily   feeding supplement  237 mL Oral BID BM   heparin   injection (subcutaneous)  5,000 Units Subcutaneous Q8H   insulin  aspart  0-9 Units Subcutaneous TID WC   mirtazapine   15 mg Oral QHS   multivitamin  1 tablet Oral QHS   pantoprazole   40 mg Oral BID    Gordy Blanch, MD 11/16/2024, 9:38 AM

## 2024-11-16 NOTE — Plan of Care (Signed)
   Problem: Clinical Measurements: Goal: Will remain free from infection Outcome: Progressing Goal: Diagnostic test results will improve Outcome: Progressing Goal: Respiratory complications will improve Outcome: Progressing Goal: Cardiovascular complication will be avoided Outcome: Progressing

## 2024-11-16 NOTE — Progress Notes (Signed)
 Nutrition Follow-up  DOCUMENTATION CODES:  Non-severe (moderate) malnutrition in context of acute illness/injury  INTERVENTION:  Magic cup TID with meals, each supplement provides 290 kcal and 9 grams of protein Mighty Shake TID with meals, each supplement provides 330 kcals and 9 grams of protein Ensure Plus High Protein po BID, each supplement provides 350 kcal and 20 grams of protein Boost Breeze po BID, each supplement provides 250 kcal and 9 grams of protein Continue MVI with minerals daily  NUTRITION DIAGNOSIS:  Moderate Malnutrition related to acute illness as evidenced by mild fat depletion, mild muscle depletion; ongoing.  GOAL:  Patient will meet greater than or equal to 90% of their needs; progressing.  MONITOR:  PO intake, Labs, Weight trends, I & O's  REASON FOR ASSESSMENT:  Consult Assessment of nutrition requirement/status  ASSESSMENT:  84 y.o. male presented to the ED with weakness, fatigue, and decreased appetite/PO. PMH includes CVA, CHF, HLD, HTN, DM, and CAD. Pt admitted with severe AKI on CKD 4, shock, and UTI.  1/19 KUB showed dilated loops of small bowel, consistent with partial SBO. Medical team monitoring; currently no clinical signs of obstruction.   Spoke with patient, his daughter, and his son at bedside. Patient has had a busy morning with multiple provider visits as well as Physical Therapy. He said he had a TUMS-like tablet from RN this weekend and he thinks it helped settle his stomach. Patient does not want to have a Cortrak feeding tube at this time. He drank a Glucerna shake this morning and feels like he is getting better and will be able to take adequate POs. For the past 2 days, daughter has been ordering vegetable broth for his lunch and dinner meals and nothing for breakfast. RD provided them with multiple supplement options and he agreed to try magic cups and mighty shakes with meals, and Ensure Plus High Protein and Boost Breeze between meals.  Provided daughter with a menu and reviewed all options and how to call to order his meals. Encouraged patient and family to consider Cortrak placement and notify MD if he decides he would like to have a feeding tube placed. Palliative care met with patient/family this morning.   Admit weight: 72.6 kg Current weight: 85.6 kg  Increasing with fluid retention  Average Meal Intake: Not recorded  Nutritionally Relevant Medications: Scheduled Meds:  Chlorhexidine  Gluconate Cloth  6 each Topical Daily   feeding supplement  237 mL Oral BID BM   heparin  injection (subcutaneous)  5,000 Units Subcutaneous Q8H   insulin  aspart  0-9 Units Subcutaneous TID WC   mirtazapine   15 mg Oral QHS   multivitamin  1 tablet Oral QHS   pantoprazole   40 mg Oral BID   Continuous Infusions:  cefTRIAXone  (ROCEPHIN )  IV 1 g (11/16/24 1403)   PRN Meds:.acetaminophen , ondansetron  (ZOFRAN ) IV, polyethylene glycol, senna, simethicone   Labs Reviewed: Phosphorus 6.3 CBG ranges from 79-117 mg/dL over the last 24 hours HgbA1c 6.4  Diet Order:   Diet Order             Diet regular Room service appropriate? Yes; Fluid consistency: Thin; Fluid restriction: 1200 mL Fluid  Diet effective now                  EDUCATION NEEDS:  Education needs have been addressed  Skin:  Skin Assessment: Reviewed RN Assessment (DTI: head, back; Stage 2: Coccyx)  Last BM:  1/19  Height:  Ht Readings from Last 1 Encounters:  11/11/24 5'  5 (1.651 m)   Weight:  Wt Readings from Last 1 Encounters:  11/16/24 85.6 kg   Ideal Body Weight:  61.8 kg  BMI:  Body mass index is 31.4 kg/m.  Estimated Nutritional Needs:  Kcal:  1650-1850 Protein:  80-100 grams Fluid:  >/= 1.5 L   Suzen HUNT RD, LDN, CNSC Contact via secure chat. If unavailable, use group chat RD Inpatient.

## 2024-11-16 NOTE — Consult Note (Signed)
 "                                                                                   Consultation Note Date: 11/16/2024   Patient Name: [name]  DOB: [birth year]  MRN: [id number]  Age / Sex: 84 y.o., male  PCP: Guy Purchase, MD Referring Physician: Raenelle Donalda CHRISTELLA, MD  Reason for Consultation: Establishing goals of care  HPI/Patient Profile: 84 y.o. male  with past medical history of hypertension, hyperlipidemia, non-STEMI status post DES to LAD in 2008, ischemic cardiomyopathy, heart failure, renal insufficiency admitted on 11/11/2024 with generalized complaints of weakness, fatigue lack of energy, decreased appetite and poor p.o. intake since December 2025  Admitted for treatment and stabilization.  Workup significant for AKI thought to have obstructive uropathy.  Nephrology/urology following and await further recommendations.  Patient and his family face ongoing decisions regarding treatment options, advanced directives and anticipatory care needs.  Clinical Assessment and Goals of Care:  This NP [name] reviewed medical records, received report from team, assessed the patient and then meet at the patient's bedside /along with his daughter [name] and son/ [name] to discuss diagnosis, prognosis, GOC, EOL wishes disposition and options.   Patient is out of bed to the chair for the first time during this hospital stay., it  feels good   Concept of Palliative Care was introduced as specialized medical care for people and their families living with serious illness.  If focuses on providing relief from the symptoms and stress of a serious illness.  The goal is to improve quality of life for both the patient and the family.Values and goals of care important to patient and family were attempted to be elicited.  Created space and opportunity for patient  and family to explore thoughts and feelings regarding current medical situation.  Both patient and his family  recognize the patient has had a continued physical and functional decline over the past 3 months.   He has had continued decreased oral intake with a 30 pound weight loss in the last year.    Education offered on the concept of adult failure to thrive, within the context of human mortality.  We discussed the limitations of medical interventions to prolong quality of life when the body does fail to thrive.     A  discussion was had today regarding advanced directives.  Concepts specific to code status, artifical feeding and hydration, continued IV antibiotics and rehospitalization was had.  Detailed education offered on concepts specific to artificial feeding and hydration versus comfort feeds.    Explored patient's thoughts and feelings on medical intervention of dialysis, its logistics and effect on quality of daily life.  The difference between an aggressive medical intervention path  and a palliative comfort care path for this patient at this time was had.     MOST form was reviewed and Hard Choices booklet left for review.    Natural trajectory and expectations at EOL were discussed.  Education offered on hospice benefit; philosophy and eligibility  Questions and concerns addressed.  Patient/family encouraged to call with questions or concerns.  Patient is able to verbalize feeling overwhelmed with his continued overall failure to thrive and pending healthcare decisions.  Both patient and family appreciate opportunity today to explore and process and verbalize need for more time to discuss and contemplate before any further healthcare decisions are made.  PMT will continue to support holistically.     Of note--patient lives at home with his son [name].  Up until a few months ago patient was independent for all ADLs,  and continued to drive short distances.  This rapid change in overall health and wellness and loss of independence has been difficult. 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      No documented HPOA or ACP documents noted.  Family believe there are documents at home and they will look forward to bring in for scanning.  At this time patient has capacity to make his own decisions with the support of his 2 children       SUMMARY OF RECOMMENDATIONS    Code Status/Advance Care Planning: Full code Educated patient/family to consider DNR/DNI status understanding evidenced based poor outcomes in similar hospitalized patient, as the cause of arrest is likely associated with advanced chronic illness rather than an easily reversible acute cardio-pulmonary event.   Symptom Management:  Per attending  Palliative Prophylaxis:  Aspiration, Delirium Protocol, Frequent Pain Assessment, and Oral Care  Additional Recommendations (Limitations, Scope, Preferences): Full Scope Treatment  Psycho-social/Spiritual:  Desire for further Chaplaincy support:no Additional Recommendations: Education on Hospice  Prognosis:  Unable to determine  Discharge Planning: To Be Determined      Primary Diagnoses: Present on Admission:  Renal failure  Kidney failure, acute   I have reviewed the medical record, interviewed the patient and family, and examined the patient. The following aspects are pertinent.  Past Medical History:  Diagnosis Date   CHF (congestive heart failure) (HCC)    past echo in 2010 showed an EF of 50 to 55% with mild AS and mild mitral insufficiency   Coronary artery disease 2008   Prior anterior MI (with syncope as the initial presentation) with single vessel LAD occlusive artherosclerotic CAD -- post stent of the mid LAD   CVA (cerebral vascular accident) (HCC) 2008   at the time of his MI   Diabetes mellitus    Edema    Heart murmur    Hyperlipidemia    Hypertension    Long term current use of anticoagulant    previously on coumadin therapy.    Mitral insufficiency    Myocardial infarction, old 2008   Obesity     Social History   Socioeconomic History   Marital status: Widowed    Spouse name: Not on file   Number of children: Not on file   Years of education: Not on file   Highest education level: Not on file  Occupational History   Not on file  Tobacco Use   Smoking status: Former    Current packs/day: 0.00    Types: Cigarettes    Quit date: 03/30/1975    Years since quitting: 49.6   Smokeless tobacco: Never  Substance and Sexual Activity   Alcohol use: No   Drug use: No   Sexual activity: Never  Other Topics Concern   Not on file  Social History Narrative   Not on file   Social Drivers of Health   Tobacco Use: Medium Risk (11/11/2024)   Patient History    Smoking Tobacco Use: Former  Smokeless Tobacco Use: Never    Passive Exposure: Not on file  Financial Resource Strain: Not on file  Food Insecurity: No Food Insecurity (11/11/2024)   Epic    Worried About Programme Researcher, Broadcasting/film/video in the Last Year: Never true    Ran Out of Food in the Last Year: Never true  Transportation Needs: No Transportation Needs (11/11/2024)   Epic    Lack of Transportation (Medical): No    Lack of Transportation (Non-Medical): No  Physical Activity: Not on file  Stress: Not on file  Social Connections: Moderately Integrated (11/11/2024)   Social Connection and Isolation Panel    Frequency of Communication with Friends and Family: More than three times a week    Frequency of Social Gatherings with Friends and Family: More than three times a week    Attends Religious Services: 1 to 4 times per year    Active Member of [employer] or Organizations: No    Attends Banker Meetings: 1 to 4 times per year    Marital Status: Widowed  Depression (PHQ2-9): Not on file  Alcohol Screen: Not on file  Housing: Low Risk (11/11/2024)   Epic    Unable to Pay for Housing in the Last Year: No    Number of Times Moved in the Last Year: 0    Homeless in the Last Year: No  Utilities: Not At Risk (11/11/2024)    Epic    Threatened with loss of utilities: No  Health Literacy: Not on file   Family History  Problem Relation Age of Onset   Dementia Mother    Heart Problems Father    Coronary artery disease Neg Hx    Congestive Heart Failure Neg Hx    Scheduled Meds:  Chlorhexidine  Gluconate Cloth  6 each Topical Daily   feeding supplement  237 mL Oral BID BM   heparin  injection (subcutaneous)  5,000 Units Subcutaneous Q8H   insulin  aspart  0-9 Units Subcutaneous TID WC   mirtazapine   15 mg Oral QHS   multivitamin  1 tablet Oral QHS   pantoprazole   40 mg Oral BID   Continuous Infusions:  cefTRIAXone  (ROCEPHIN )  IV 1 g (11/15/24 1551)   PRN Meds:.acetaminophen , ondansetron  (ZOFRAN ) IV, polyethylene glycol, senna, simethicone  Medications Prior to Admission:  Prior to Admission medications  Medication Sig Start Date End Date Taking? Authorizing Provider  aspirin  81 MG tablet Take 81 mg by mouth at bedtime.   Yes [provider]  atorvastatin  (LIPITOR) 80 MG tablet Take 1 tablet (80 mg total) by mouth daily. Patient taking differently: Take 80 mg by mouth at bedtime. 11/03/24  Yes Jordan, Peter M, MD  carvedilol  (COREG ) 12.5 MG tablet Take 1 tablet (12.5 mg total) by mouth 2 (two) times daily. 11/03/24 02/01/25 Yes Jordan, Peter M, MD  dapagliflozin propanediol (FARXIGA) 5 MG TABS tablet Take 5 mg by mouth at bedtime.   Yes [provider]  furosemide  (LASIX ) 20 MG tablet Take 1 tablet (20 mg total) by mouth daily. 11/03/24  Yes Jordan, Peter M, MD  losartan  (COZAAR ) 100 MG tablet Take 1 tablet (100 mg total) by mouth daily. 11/03/24  Yes Jordan, Peter M, MD  Lutein-Zeaxanthin (OCUVITE LUTEIN 25 PO) Take 1 capsule by mouth at bedtime.   Yes [provider]  Multiple Vitamins-Minerals (CENTRUM SILVER PO) Take 1 tablet by mouth daily.    Yes [provider]  nitroGLYCERIN  (NITROSTAT ) 0.4 MG SL tablet Place 1 tablet (0.4 mg total) under  the tongue every 5 (five) minutes as  needed for chest pain (x 3 doses). 11/03/24  Yes Jordan, Peter M, MD  spironolactone  (ALDACTONE ) 25 MG tablet Take 1/2 tablet by mouth (12.5 mg) daily 11/03/24  Yes Jordan, Peter M, MD   Allergies[1] Review of Systems  Neurological:  Positive for weakness.    Physical Exam Cardiovascular:     Rate and Rhythm: Normal rate.  Pulmonary:     Effort: Pulmonary effort is normal.  Skin:    General: Skin is warm and dry.  Neurological:     Mental Status: He is alert and oriented to person, place, and time.     Vital Signs: BP (!) 107/48 (BP Location: Left Arm)   Pulse 94   Temp 97.8 F (36.6 C) (Oral)   Resp 12   Ht 5' 5 (1.651 m)   Wt 85.6 kg   SpO2 96%   BMI 31.40 kg/m  Pain Scale: 0-10 POSS *See Group Information*: 1-Acceptable,Awake and alert Pain Score: 0-No pain   SpO2: SpO2: 96 % O2 Device:SpO2: 96 % O2 Flow Rate: .   IO: Intake/output summary:  Intake/Output Summary (Last 24 hours) at 11/16/2024 1030 Last data filed at 11/15/2024 1800 Gross per 24 hour  Intake 100 ml  Output 650 ml  Net -550 ml    LBM: Last BM Date : 11/16/24 Baseline Weight: Weight: 72.6 kg Most recent weight: Weight: 85.6 kg     Palliative Assessment/Data: 50 % at best     Time: 75 minutes  Discussed with attending/Dr. Raenelle Signed by: Guy Plants, NP   Please contact Palliative Medicine Team phone at [phone] for questions and concerns.  For individual provider: See Amion                 [1] No Known Allergies  "

## 2024-11-17 ENCOUNTER — Other Ambulatory Visit: Payer: Self-pay

## 2024-11-17 DIAGNOSIS — E872 Acidosis, unspecified: Secondary | ICD-10-CM | POA: Diagnosis not present

## 2024-11-17 DIAGNOSIS — N179 Acute kidney failure, unspecified: Secondary | ICD-10-CM | POA: Diagnosis not present

## 2024-11-17 DIAGNOSIS — I959 Hypotension, unspecified: Secondary | ICD-10-CM | POA: Diagnosis not present

## 2024-11-17 DIAGNOSIS — A419 Sepsis, unspecified organism: Secondary | ICD-10-CM | POA: Diagnosis not present

## 2024-11-17 LAB — RENAL FUNCTION PANEL
Albumin: 1.9 g/dL — ABNORMAL LOW (ref 3.5–5.0)
Anion gap: 15 (ref 5–15)
BUN: 121 mg/dL — ABNORMAL HIGH (ref 8–23)
CO2: 20 mmol/L — ABNORMAL LOW (ref 22–32)
Calcium: 7.6 mg/dL — ABNORMAL LOW (ref 8.9–10.3)
Chloride: 103 mmol/L (ref 98–111)
Creatinine, Ser: 6.68 mg/dL — ABNORMAL HIGH (ref 0.61–1.24)
GFR, Estimated: 8 mL/min — ABNORMAL LOW
Glucose, Bld: 154 mg/dL — ABNORMAL HIGH (ref 70–99)
Phosphorus: 5.1 mg/dL — ABNORMAL HIGH (ref 2.5–4.6)
Potassium: 3 mmol/L — ABNORMAL LOW (ref 3.5–5.1)
Sodium: 138 mmol/L (ref 135–145)

## 2024-11-17 LAB — GLUCOSE, CAPILLARY
Glucose-Capillary: 114 mg/dL — ABNORMAL HIGH (ref 70–99)
Glucose-Capillary: 128 mg/dL — ABNORMAL HIGH (ref 70–99)
Glucose-Capillary: 111 mg/dL — ABNORMAL HIGH (ref 70–99)
Glucose-Capillary: 130 mg/dL — ABNORMAL HIGH (ref 70–99)

## 2024-11-17 LAB — CBC
HCT: 25.9 % — ABNORMAL LOW (ref 39.0–52.0)
Hemoglobin: 8.2 g/dL — ABNORMAL LOW (ref 13.0–17.0)
MCH: 32 pg (ref 26.0–34.0)
MCHC: 31.7 g/dL (ref 30.0–36.0)
MCV: 101.2 fL — ABNORMAL HIGH (ref 80.0–100.0)
Platelets: 195 K/uL (ref 150–400)
RBC: 2.56 MIL/uL — ABNORMAL LOW (ref 4.22–5.81)
RDW: 16.7 % — ABNORMAL HIGH (ref 11.5–15.5)
WBC: 11.8 K/uL — ABNORMAL HIGH (ref 4.0–10.5)
nRBC: 0 % (ref 0.0–0.2)

## 2024-11-17 MED ORDER — POTASSIUM CHLORIDE CRYS ER 20 MEQ PO TBCR
40.0000 meq | EXTENDED_RELEASE_TABLET | Freq: Once | ORAL | Status: AC
  Administered 2024-11-17: 40 meq via ORAL
  Filled 2024-11-17: qty 2

## 2024-11-17 MED ORDER — MIDODRINE HCL 5 MG PO TABS
10.0000 mg | ORAL_TABLET | Freq: Three times a day (TID) | ORAL | Status: DC
  Administered 2024-11-17 – 2024-11-20 (×12): 10 mg via ORAL
  Filled 2024-11-17 (×12): qty 2

## 2024-11-17 MED ORDER — HEPARIN SODIUM (PORCINE) 5000 UNIT/ML IJ SOLN
5000.0000 [IU] | Freq: Three times a day (TID) | INTRAMUSCULAR | Status: DC
  Administered 2024-11-17 – 2024-11-19 (×4): 5000 [IU] via SUBCUTANEOUS
  Filled 2024-11-17 (×4): qty 1

## 2024-11-17 MED ORDER — HEPARIN SODIUM (PORCINE) 5000 UNIT/ML IJ SOLN: 5000.0000 [IU] | Freq: Three times a day (TID) | INTRAMUSCULAR | Status: DC

## 2024-11-17 MED ORDER — SODIUM CHLORIDE 0.9 % IV SOLN: 2.0000 g | INTRAVENOUS | Status: DC

## 2024-11-17 NOTE — Plan of Care (Signed)

## 2024-11-17 NOTE — Progress Notes (Signed)
" ° °  °  Subjective: [name]. Reviewed case and plan with patient and both children. Pt resting comfortably in bed.   Objective: Vital signs in last 24 hours: Temp:  [97.9 F (36.6 C)-98.8 F (37.1 C)] 97.9 F (36.6 C) (01/20 0814) Pulse Rate:  [76-101] 79 (01/20 0814) Resp:  [10-20] 20 (01/20 0814) BP: (81-123)/(39-64) 94/44 (01/20 0814) SpO2:  [90 %-100 %] 96 % (01/20 0814) Weight:  [85.1 kg] 85.1 kg (01/20 0627)  Assessment/Plan: # Gross hematuria # Anemia # ARF # BPH  Initial improvement in renal function has now stalled. Unlikely that UO would be visualized with his level of bladder wall thickening. Pt and family also hoping to avoid anesthesia. Consulted IR for bilateral PCNT. If renal function improves, can consider internalizing with antegrade stent at a later date.  Intake/Output from previous day: 01/19 0701 - 01/20 0700 In: -  Out: 1000 [Urine:1000]  Intake/Output this shift: No intake/output data recorded.  Physical Exam:  General: Alert and oriented CV: No cyanosis Lungs: equal chest rise Gu: foley catheter in with clear yellow urine  Lab Results: Recent Labs    11/15/24 1659 11/16/24 0753 11/17/24 0156  HGB 9.9* 9.5* 8.2*  HCT 29.4* 29.2* 25.9*   BMET Recent Labs    11/16/24 0753 11/17/24 0156  NA 139 138  K 3.5 3.0*  CL 102 103  CO2 18* 20*  GLUCOSE 87 154*  BUN 119* 121*  CREATININE 6.84* 6.68*  CALCIUM  8.2* 7.6*  HGB 9.5* 8.2*  WBC 15.5* 11.8*     Studies/Results: DG Abd 1 View Result Date: 11/16/2024 EXAM: 1 VIEW XRAY OF THE ABDOMEN 11/16/2024 12:32:00 AM COMPARISON: 11/11/2024 CT CLINICAL HISTORY: Distended abdomen. FINDINGS: BOWEL: Scattered large and small bowel gas is noted. Dilatation of the small bowel is noted consistent with at least a partial small bowel obstruction. No definitive free air is seen. SOFT TISSUES: No abnormal calcifications. BONES: Degenerative changes of the lumbar spine are noted. No acute fracture. IMPRESSION:  1. Dilatation of the small bowel consistent with at least a partial small bowel obstruction. These findings are new from the prior CT examination. 2. No definitive free air. Electronically signed by: Oneil Devonshire MD 11/16/2024 12:38 AM EST RP Workstation: [id number]      LOS: 6 days   Ole Bourdon, NP [facility] Pager: [phone]  11/17/2024, 11:14 AM  "

## 2024-11-17 NOTE — Progress Notes (Signed)
 "                        PROGRESS NOTE        PATIENT DETAILS Name: [name] Age: 84 y.o. Sex: male Date of Birth: [birth year] Admit Date: 11/11/2024 Admitting Physician Lenny Drought, MD ERE:Xzmm, Reyes, MD  Brief Summary: Patient is a 84 y.o.  male with history of CAD s/p PCI 2008, HFmrEF, HTN, HLD-presented with generalized weakness/fatigue-loss of appetite-found to be hypotensive with AKI-thought to have obstructive uropathy.  Initially admitted to the ICU for pressors-stabilized and subsequently transferred to [facility].  Significant events: 1/14>> admit to ICU 1/19>> transferred to [facility].  Significant studies: 1/14>> CXR: No PNA 1/14>> CT head: No acute intracranial process 1/14>> CT abdomen/pelvis: Severe bilateral hydronephrosis 1/14>> renal ultrasound: Severe bilateral hydronephrosis. 1/15>> echo: EF 55-60% 1/17>> renal ultrasound: Severe bilateral hydronephrosis 1/19>> x-ray KUB: Dilated loops of small bowel-at least consistent with a partial SBO.  Significant microbiology data: 1/14>> COVID/influenza/RSV PCR: Negative 1/14>> blood culture: No growth 1/15>> urine culture: No growth  Procedures: None  Consults: PCCM Nephrology Neurology  Subjective: Had mild BMs overnight-starting to eat a bit more.  Dyspeptic symptoms are much better.  Objective: Vitals: Blood pressure (!) 94/44, pulse 79, temperature 97.9 F (36.6 C), temperature source Oral, resp. rate 20, height 5' 5 (1.651 m), weight 85.1 kg, SpO2 96%.   Exam: Gen Exam:Alert awake-not in any distress HEENT:atraumatic, normocephalic Chest: B/L clear to auscultation anteriorly CVS:S1S2 regular Abdomen:soft non tender, non distended Extremities:no edema Neurology: Non focal Skin: no rash  Pertinent Labs/Radiology:    Latest Ref Rng & Units 11/17/2024    1:56 AM 11/16/2024    7:53 AM 11/15/2024    4:59 PM  CBC  WBC 4.0 - 10.5 K/uL 11.8  15.5  20.0   Hemoglobin 13.0 - 17.0 g/dL 8.2  9.5  9.9    Hematocrit 39.0 - 52.0 % 25.9  29.2  29.4   Platelets 150 - 400 K/uL 195  214  200     Lab Results  Component Value Date   NA 138 11/17/2024   K 3.0 (L) 11/17/2024   CL 103 11/17/2024   CO2 20 (L) 11/17/2024     Assessment/Plan: AKI Secondary to obstructive uropathy Although renal function improved-it seems to have plateaued over the past several days Foley catheter in place With nephrology/urology-IR consulted for percutaneous nephrostomy-as patient continues to have persistent hydronephrosis bilaterally with no improvement in his renal function over the past 3 days.  Hyperkalemia Metabolic acidosis Secondary to AKI Resolved/improved Follow electrolytes periodically  Multifactorial shock Secondary to septic shock due to UTI and hypovolemic shock from poor oral intake BP has stabilized with supportive care Briefly required pressors on initial presentation Although BP better-remains soft-starting today and following.  Complicated UTI Continue Rocephin -although urine cultures negative-this was done after patient was given antibiotics  ?  Partial SBO Tolerating diet-no vomiting-abdomen is not distended Had multiple BMs yesterday Clinically does not appear to have bowel obstruction-plan is to monitor closely.   GERD Dyspeptic symptoms are much better Continue PPI As needed simethicone .  Normocytic anemia Likely secondary to critical illness-no evidence of blood loss Follow CBC  History of CAD s/p PCI 2008 No anginal symptoms  Chronic HFmrEF Euvolemic Diuretics on hold  HTN Presented with hypotension-all antihypertensives on hold-resume when able  DM-2 (A1c 6.4 on 1/15) CBG stable-SSI  Anorexia/failure to thrive syndrome Poor oral intake for the past several months Patient  not keen on getting NG tube placed Thankfully slowly improving-continue to encourage oral intake-continue supplements Remains on Remeron  Palliative care following.   Nutrition  Status: Nutrition Problem: Moderate Malnutrition Etiology: acute illness Signs/Symptoms: mild fat depletion, mild muscle depletion Interventions: Refer to RD note for recommendations  Pressure Ulcer: Agree with assessment as outlined below Wound 11/11/24 1850 Pressure Injury Coccyx Mid Stage 2 -  Partial thickness loss of dermis presenting as a shallow open injury with a red, pink wound bed without slough. (Active)     Wound 11/11/24 1850 Pressure Injury Back Mid;Upper Deep Tissue Pressure Injury - Purple or maroon localized area of discolored intact skin or blood-filled blister due to damage of underlying soft tissue from pressure and/or shear. (Active)     Wound 11/11/24 1850 Pressure Injury Head Posterior;Medial Deep Tissue Pressure Injury - Purple or maroon localized area of discolored intact skin or blood-filled blister due to damage of underlying soft tissue from pressure and/or shear. (Active)    Class 1 Obesity: Estimated body mass index is 31.22 kg/m as calculated from the following:   Height as of this encounter: 5' 5 (1.651 m).   Weight as of this encounter: 85.1 kg.   Code status:   Code Status: Full Code   DVT Prophylaxis: heparin  injection 5,000 Units Start: 11/14/24 1800 SCDs Start: 11/11/24 1516   Family Communication: Daughter/Son at bedside   Disposition Plan: Status is: Inpatient Remains inpatient appropriate because: Severity of illness   Planned Discharge Destination:Skilled nursing facility   Diet: Diet Order             Diet NPO time specified Except for: Sips with Meds  Diet effective now                     Antimicrobial agents: Anti-infectives (From admission, onward)    Start     Dose/Rate Route Frequency Ordered Stop   11/13/24 1400  cefTRIAXone  (ROCEPHIN ) 1 g in sodium chloride  0.9 % 100 mL IVPB        1 g 200 mL/hr over 30 Minutes Intravenous Every 24 hours 11/13/24 0935 11/17/24 2359   11/12/24 1400  ceFEPIme  (MAXIPIME ) 1 g  in sodium chloride  0.9 % 100 mL IVPB  Status:  Discontinued        1 g 200 mL/hr over 30 Minutes Intravenous Every 24 hours 11/12/24 0824 11/13/24 0935   11/12/24 0800  cefTRIAXone  (ROCEPHIN ) 2 g in sodium chloride  0.9 % 100 mL IVPB  Status:  Discontinued        2 g 200 mL/hr over 30 Minutes Intravenous Every 24 hours 11/11/24 1531 11/12/24 0824   11/11/24 1345  vancomycin  (VANCOREADY) IVPB 1500 mg/300 mL        1,500 mg 150 mL/hr over 120 Minutes Intravenous  Once 11/11/24 1332 11/11/24 1702   11/11/24 1330  metroNIDAZOLE  (FLAGYL ) IVPB 500 mg        500 mg 100 mL/hr over 60 Minutes Intravenous  Once 11/11/24 1321 11/11/24 1558   11/11/24 1330  ceFEPIme  (MAXIPIME ) 2 g in sodium chloride  0.9 % 100 mL IVPB        2 g 200 mL/hr over 30 Minutes Intravenous  Once 11/11/24 1332 11/11/24 1457        MEDICATIONS: Scheduled Meds:  Chlorhexidine  Gluconate Cloth  6 each Topical Daily   feeding supplement  1 Container Oral BID BM   feeding supplement  237 mL Oral BID BM   heparin  injection (subcutaneous)  5,000  Units Subcutaneous Q8H   insulin  aspart  0-9 Units Subcutaneous TID WC   midodrine   10 mg Oral TID WC   mirtazapine   15 mg Oral QHS   multivitamin  1 tablet Oral QHS   pantoprazole   40 mg Oral BID   Continuous Infusions:  cefTRIAXone  (ROCEPHIN )  IV 1 g (11/16/24 1403)   PRN Meds:.acetaminophen , ondansetron  (ZOFRAN ) IV, polyethylene glycol, senna, simethicone    I have personally reviewed following labs and imaging studies  LABORATORY DATA: CBC: Recent Labs  Lab 11/11/24 1230 11/11/24 1232 11/14/24 1639 11/15/24 0416 11/15/24 1659 11/16/24 0753 11/17/24 0156  WBC 20.7*   < > 15.1* 13.3* 20.0* 15.5* 11.8*  NEUTROABS 18.9*  --   --   --   --   --   --   HGB 9.2*   < > 8.5* 8.5* 9.9* 9.5* 8.2*  HCT 28.7*   < > 25.1* 24.7* 29.4* 29.2* 25.9*  MCV 102.1*   < > 95.4 95.4 98.0 98.3 101.2*  PLT 276   < > 167 172 200 214 195   < > = values in this interval not displayed.     Basic Metabolic Panel: Recent Labs  Lab 11/12/24 0500 11/12/24 1813 11/13/24 0432 11/13/24 1325 11/14/24 0447 11/14/24 2000 11/15/24 0416 11/15/24 1659 11/16/24 0753 11/17/24 0156  NA 132*   < > 132*   < > 134* 134* 136 140 139 138  K 4.1   < > 3.6   < > 3.5 4.2 3.8 4.0 3.5 3.0*  CL 101   < > 96*   < > 97* 99 100 101 102 103  CO2 15*   < > 21*   < > 21* 21* 21* 16* 18* 20*  GLUCOSE 219*   < > 165*   < > 116* 95 86 84 87 154*  BUN 161*   < > 136*   < > 129* 122* 122* 118* 119* 121*  CREATININE 9.70*   < > 7.98*   < > 7.26* 7.11* 6.86* 6.74* 6.84* 6.68*  CALCIUM  7.5*   < > 7.2*   < > 7.6* 7.6* 7.6* 8.0* 8.2* 7.6*  MG 1.9  --  2.1  --  2.2  --  2.0  --  2.1  --   PHOS 4.0  --  4.0  --  4.9*  --  5.4*  --  6.3* 5.1*   < > = values in this interval not displayed.    GFR: Estimated Creatinine Clearance: 8.5 mL/min (A) (by C-G formula based on SCr of 6.68 mg/dL (H)).  Liver Function Tests: Recent Labs  Lab 11/11/24 1425 11/11/24 1643 11/17/24 0156  AST 19 19  --   ALT 20 18  --   ALKPHOS 78 62  --   BILITOT 0.3 0.2  --   PROT 5.7* 4.9*  --   ALBUMIN 2.5* 2.2* 1.9*   No results for input(s): LIPASE, AMYLASE in the last 168 hours. No results for input(s): AMMONIA in the last 168 hours.  Coagulation Profile: Recent Labs  Lab 11/14/24 0447  INR 1.3*    Cardiac Enzymes: No results for input(s): CKTOTAL, CKMB, CKMBINDEX, TROPONINI in the last 168 hours.  BNP (last 3 results) Recent Labs    11/11/24 1230  PROBNP 4,787.0*    Lipid Profile: No results for input(s): CHOL, HDL, LDLCALC, TRIG, CHOLHDL, LDLDIRECT in the last 72 hours.  Thyroid  Function Tests: No results for input(s): TSH, T4TOTAL, FREET4, T3FREE, THYROIDAB in the  last 72 hours.  Anemia Panel: No results for input(s): VITAMINB12, FOLATE, FERRITIN, TIBC, IRON, RETICCTPCT in the last 72 hours.  Urine analysis:    Component Value Date/Time    COLORURINE AMBER (A) 11/11/2024 1222   APPEARANCEUR TURBID (A) 11/11/2024 1222   LABSPEC 1.010 11/11/2024 1222   PHURINE 5.0 11/11/2024 1222   GLUCOSEU NEGATIVE 11/11/2024 1222   HGBUR LARGE (A) 11/11/2024 1222   BILIRUBINUR NEGATIVE 11/11/2024 1222   KETONESUR NEGATIVE 11/11/2024 1222   PROTEINUR >=300 (A) 11/11/2024 1222   NITRITE NEGATIVE 11/11/2024 1222   LEUKOCYTESUR LARGE (A) 11/11/2024 1222    Sepsis Labs: Lactic Acid, Venous    Component Value Date/Time   LATICACIDVEN 1.2 11/11/2024 1437    MICROBIOLOGY: Recent Results (from the past 240 hours)  Resp panel by RT-PCR (RSV, Flu A&B, Covid) Anterior Nasal Swab     Status: None   Collection Time: 11/11/24 12:23 PM   Specimen: Anterior Nasal Swab  Result Value Ref Range Status   SARS Coronavirus 2 by RT PCR NEGATIVE NEGATIVE Final   Influenza A by PCR NEGATIVE NEGATIVE Final   Influenza B by PCR NEGATIVE NEGATIVE Final    Comment: (NOTE) The Xpert Xpress SARS-CoV-2/FLU/RSV plus assay is intended as an aid in the diagnosis of influenza from Nasopharyngeal swab specimens and should not be used as a sole basis for treatment. Nasal washings and aspirates are unacceptable for Xpert Xpress SARS-CoV-2/FLU/RSV testing.  Fact Sheet for Patients: bloggercourse.com  Fact Sheet for Healthcare Providers: seriousbroker.it  This test is not yet approved or cleared by the [place]  FDA and has been authorized for detection and/or diagnosis of SARS-CoV-2 by FDA under an Emergency Use Authorization (EUA). This EUA will remain in effect (meaning this test can be used) for the duration of the COVID-19 declaration under Section 564(b)(1) of the Act, 21 U.S.C. section 360bbb-3(b)(1), unless the authorization is terminated or revoked.     Resp Syncytial Virus by PCR NEGATIVE NEGATIVE Final    Comment: (NOTE) Fact Sheet for  Patients: bloggercourse.com  Fact Sheet for Healthcare Providers: seriousbroker.it  This test is not yet approved or cleared by the [place]  FDA and has been authorized for detection and/or diagnosis of SARS-CoV-2 by FDA under an Emergency Use Authorization (EUA). This EUA will remain in effect (meaning this test can be used) for the duration of the COVID-19 declaration under Section 564(b)(1) of the Act, 21 U.S.C. section 360bbb-3(b)(1), unless the authorization is terminated or revoked.  Performed at [facility], [address], [place], [name] [address]   Blood culture (routine x 2)     Status: None   Collection Time: 11/11/24  2:20 PM   Specimen: BLOOD RIGHT WRIST  Result Value Ref Range Status   Specimen Description BLOOD RIGHT WRIST  Final   Special Requests   Final    BOTTLES DRAWN AEROBIC AND ANAEROBIC Blood Culture results may not be optimal due to an inadequate volume of blood received in culture bottles   Culture   Final    NO GROWTH 5 DAYS Performed at [facility], [address] [place], [name] [address]    Report Status 11/16/2024 FINAL  Final  Blood culture (routine x 2)     Status: None   Collection Time: 11/11/24  2:25 PM   Specimen: BLOOD  Result Value Ref Range Status   Specimen Description BLOOD RIGHT ANTECUBITAL  Final   Special Requests   Final    BOTTLES DRAWN  AEROBIC AND ANAEROBIC Blood Culture adequate volume   Culture   Final    NO GROWTH 5 DAYS Performed at [facility], [address] [place], [name] [address]    Report Status 11/16/2024 FINAL  Final  MRSA Next Gen by PCR, Nasal     Status: None   Collection Time: 11/11/24  6:55 PM   Specimen: Nasal Mucosa; Nasal Swab  Result Value Ref Range Status   MRSA by PCR Next Gen NOT DETECTED NOT DETECTED Final    Comment: (NOTE) The GeneXpert MRSA Assay (FDA approved for NASAL specimens only), is one component of a  comprehensive MRSA colonization surveillance program. It is not intended to diagnose MRSA infection nor to guide or monitor treatment for MRSA infections. Test performance is not FDA approved in patients less than 65 years old. Performed at [facility], [address] [place], [name] [address]   Urine Culture (for pregnant, neutropenic or urologic patients or patients with an indwelling urinary catheter)     Status: None   Collection Time: 11/12/24  9:01 AM   Specimen: Urine, Catheterized  Result Value Ref Range Status   Specimen Description URINE, CATHETERIZED  Final   Special Requests NONE  Final   Culture   Final    NO GROWTH Performed at [facility], [address] [place], [name] [address]    Report Status 11/13/2024 FINAL  Final    RADIOLOGY STUDIES/RESULTS: DG Abd 1 View Result Date: 11/16/2024 EXAM: 1 VIEW XRAY OF THE ABDOMEN 11/16/2024 12:32:00 AM COMPARISON: 11/11/2024 CT CLINICAL HISTORY: Distended abdomen. FINDINGS: BOWEL: Scattered large and small bowel gas is noted. Dilatation of the small bowel is noted consistent with at least a partial small bowel obstruction. No definitive free air is seen. SOFT TISSUES: No abnormal calcifications. BONES: Degenerative changes of the lumbar spine are noted. No acute fracture. IMPRESSION: 1. Dilatation of the small bowel consistent with at least a partial small bowel obstruction. These findings are new from the prior CT examination. 2. No definitive free air. Electronically signed by: Oneil Devonshire MD 11/16/2024 12:38 AM EST RP Workstation: [id number]     LOS: 6 days   Donalda Applebaum, MD  [facility]    To contact the attending provider between 7A-7P or the covering provider during after hours 7P-7A, please log into the web site [email or web address] and access using universal [place] password for that web site. If you do not have the password, please call the hospital operator.  11/17/2024, 11:40 AM    "

## 2024-11-17 NOTE — Progress Notes (Signed)
" ° °  IR Note  Pt was scheduled for B PCNs this afternoon. Unfortunately, IR has had an emergent procedure and must postpone this pts procedure until 1/21 am  New orders placed RN made aware "

## 2024-11-17 NOTE — Consult Note (Signed)
 "     Chief Complaint: Patient was seen in consultation today for Bilateral hydronephrosis; Bilateral percutaneous nephrotomies  Chief Complaint  Patient presents with   Weakness   no appetite   at the request of Dr JAYSON Bourdon  Supervising Physician: Jennefer Rover  Patient Status: [facility] - In-pt  History of Present Illness: Guy BACALLAO is a 84 y.o. male   FULL Code status  CAD; CHF; HTN; HLD Weakness/fatigue; hypotensive; acute kidney injury Obstructive uropath Renal US  revealing B hydronephrosis AKI Secondary to obstructive uropathy Although renal function improved-it seems to have plateaued over the past several days Foley catheter in place With nephrology/urology-IR consulted for percutaneous nephrostomy-as patient continues to have persistent hydronephrosis bilaterally with no improvement in his renal function over the past 3 days.  Request for B PCNs-- approved with Dr Jennefer Planned for IR procedure today NPO since last night Hep inj given at 545 am; held 2pm and 8pm today   Past Medical History:  Diagnosis Date   CHF (congestive heart failure) (HCC)    past echo in 2010 showed an EF of 50 to 55% with mild AS and mild mitral insufficiency   Coronary artery disease 2008   Prior anterior MI (with syncope as the initial presentation) with single vessel LAD occlusive artherosclerotic CAD -- post stent of the mid LAD   CVA (cerebral vascular accident) (HCC) 2008   at the time of his MI   Diabetes mellitus    Edema    Heart murmur    Hyperlipidemia    Hypertension    Long term current use of anticoagulant    previously on coumadin therapy.    Mitral insufficiency    Myocardial infarction, old 2008   Obesity     Past Surgical History:  Procedure Laterality Date   CORONARY ANGIOPLASTY WITH STENT PLACEMENT  04/10/2007   with drug-eluting stent placement of the mid LAD   KNEE SURGERY      Allergies: Patient has no known allergies.  Medications: Prior  to Admission medications  Medication Sig Start Date End Date Taking? Authorizing Provider  aspirin  81 MG tablet Take 81 mg by mouth at bedtime.   Yes [provider]  atorvastatin  (LIPITOR) 80 MG tablet Take 1 tablet (80 mg total) by mouth daily. Patient taking differently: Take 80 mg by mouth at bedtime. 11/03/24  Yes Jordan, Peter M, MD  carvedilol  (COREG ) 12.5 MG tablet Take 1 tablet (12.5 mg total) by mouth 2 (two) times daily. 11/03/24 02/01/25 Yes Jordan, Peter M, MD  dapagliflozin propanediol (FARXIGA) 5 MG TABS tablet Take 5 mg by mouth at bedtime.   Yes [provider]  furosemide  (LASIX ) 20 MG tablet Take 1 tablet (20 mg total) by mouth daily. 11/03/24  Yes Jordan, Peter M, MD  losartan  (COZAAR ) 100 MG tablet Take 1 tablet (100 mg total) by mouth daily. 11/03/24  Yes Jordan, Peter M, MD  Lutein-Zeaxanthin (OCUVITE LUTEIN 25 PO) Take 1 capsule by mouth at bedtime.   Yes [provider]  Multiple Vitamins-Minerals (CENTRUM SILVER PO) Take 1 tablet by mouth daily.    Yes [provider]  nitroGLYCERIN  (NITROSTAT ) 0.4 MG SL tablet Place 1 tablet (0.4 mg total) under the tongue every 5 (five) minutes as needed for chest pain (x 3 doses). 11/03/24  Yes Jordan, Peter M, MD  spironolactone  (ALDACTONE ) 25 MG tablet Take 1/2 tablet by mouth (12.5 mg) daily 11/03/24  Yes Jordan, Peter M, MD     Family  History  Problem Relation Age of Onset   Dementia Mother    Heart Problems Father    Coronary artery disease Neg Hx    Congestive Heart Failure Neg Hx     Social History   Socioeconomic History   Marital status: Widowed    Spouse name: Not on file   Number of children: Not on file   Years of education: Not on file   Highest education level: Not on file  Occupational History   Not on file  Tobacco Use   Smoking status: Former    Current packs/day: 0.00    Types: Cigarettes    Quit date: 03/30/1975    Years since quitting: 49.6   Smokeless tobacco: Never   Substance and Sexual Activity   Alcohol use: No   Drug use: No   Sexual activity: Never  Other Topics Concern   Not on file  Social History Narrative   Not on file   Social Drivers of Health   Tobacco Use: Medium Risk (11/11/2024)   Patient History    Smoking Tobacco Use: Former    Smokeless Tobacco Use: Never    Passive Exposure: Not on Actuary Strain: Not on file  Food Insecurity: No Food Insecurity (11/11/2024)   Epic    Worried About Programme Researcher, Broadcasting/film/video in the Last Year: Never true    Ran Out of Food in the Last Year: Never true  Transportation Needs: No Transportation Needs (11/11/2024)   Epic    Lack of Transportation (Medical): No    Lack of Transportation (Non-Medical): No  Physical Activity: Not on file  Stress: Not on file  Social Connections: Moderately Integrated (11/11/2024)   Social Connection and Isolation Panel    Frequency of Communication with Friends and Family: More than three times a week    Frequency of Social Gatherings with Friends and Family: More than three times a week    Attends Religious Services: 1 to 4 times per year    Active Member of [employer] or Organizations: No    Attends Banker Meetings: 1 to 4 times per year    Marital Status: Widowed  Depression (PHQ2-9): Not on file  Alcohol Screen: Not on file  Housing: Low Risk (11/11/2024)   Epic    Unable to Pay for Housing in the Last Year: No    Number of Times Moved in the Last Year: 0    Homeless in the Last Year: No  Utilities: Not At Risk (11/11/2024)   Epic    Threatened with loss of utilities: No  Health Literacy: Not on file    Review of Systems: A 12 point ROS discussed and pertinent positives are indicated in the HPI above.  All other systems are negative.  Review of Systems  Constitutional:  Positive for activity change and fatigue. Negative for fever.  Respiratory:  Negative for cough and shortness of breath.   Cardiovascular:  Negative for chest  pain.  Genitourinary:  Positive for decreased urine volume and difficulty urinating.  Musculoskeletal:  Negative for back pain.  Neurological:  Positive for weakness.  Psychiatric/Behavioral:  Negative for behavioral problems and confusion.     Vital Signs: BP (!) 82/53 (BP Location: Left Arm)   Pulse 84   Temp 97.9 F (36.6 C) (Oral)   Resp 15   Ht 5' 5 (1.651 m)   Wt 187 lb 9.8 oz (85.1 kg)   SpO2 97%   BMI 31.22 kg/m  Advance Care Plan: The advanced care plan/surrogate decision maker was discussed at the time of visit and documented in the medical record.    Physical Exam Vitals reviewed.  Constitutional:      Comments: Frail apperaring  HENT:     Mouth/Throat:     Mouth: Mucous membranes are moist.  Cardiovascular:     Rate and Rhythm: Normal rate and regular rhythm.     Heart sounds: Normal heart sounds. No murmur heard. Pulmonary:     Effort: Pulmonary effort is normal.     Breath sounds: No wheezing.  Abdominal:     Palpations: Abdomen is soft.  Musculoskeletal:        General: Normal range of motion.  Skin:    General: Skin is warm.  Neurological:     Mental Status: He is alert and oriented to person, place, and time.  Psychiatric:        Behavior: Behavior normal.     Imaging: DG Abd 1 View Result Date: 11/16/2024 EXAM: 1 VIEW XRAY OF THE ABDOMEN 11/16/2024 12:32:00 AM COMPARISON: 11/11/2024 CT CLINICAL HISTORY: Distended abdomen. FINDINGS: BOWEL: Scattered large and small bowel gas is noted. Dilatation of the small bowel is noted consistent with at least a partial small bowel obstruction. No definitive free air is seen. SOFT TISSUES: No abnormal calcifications. BONES: Degenerative changes of the lumbar spine are noted. No acute fracture. IMPRESSION: 1. Dilatation of the small bowel consistent with at least a partial small bowel obstruction. These findings are new from the prior CT examination. 2. No definitive free air. Electronically signed by: Oneil Devonshire MD 11/16/2024 12:38 AM EST RP Workstation: [id number]   US  RENAL Result Date: 11/14/2024 EXAM: RETROPERITONEAL ULTRASOUND OF THE KIDNEYS 11/14/2024 08:34:00 AM TECHNIQUE: Real-time ultrasonography of the retroperitoneum, specifically the kidneys and urinary bladder, was performed. COMPARISON: CT and ultrasound dated 11/11/2024. CLINICAL HISTORY: Bilateral hydronephrosis. FINDINGS: RIGHT KIDNEY: Right kidney measures 13.2 x 5.8 x 5.5 cm. Unchanged bilateral lobulated kidneys with cortical thinning. Severe hydronephrosis, similar in appearance to prior study. No calculus. No mass. LEFT KIDNEY: Left kidney measures 11.5 x 5.5 x 5.6 cm. Unchanged bilateral lobulated kidneys with cortical thinning. Severe hydronephrosis, similar in appearance to prior study. No calculus. No mass. BLADDER: Limited evaluation of the bladder as the bladder is decompressed with a foley catheter in place. Partially imaged small right pleural effusion and trace perihepatic free fluid. IMPRESSION: 1. Severe bilateral hydronephrosis, similar to prior study. 2. Partially imaged right pleural effusion and trace perihepatic free fluid. Electronically signed by: Michaeline Blanch MD 11/14/2024 10:21 AM EST RP Workstation: [id number]   DG CHEST PORT 1 VIEW Result Date: 11/12/2024 CLINICAL DATA:  Central line placement. EXAM: PORTABLE CHEST 1 VIEW COMPARISON:  Earlier film, same date. FINDINGS: The right sided catheter has been removed. The left IJ catheter tip is in the region of the brachiocephalic SVC junction. No complicating features are identified. Stable heart and lungs. No acute findings. IMPRESSION: Left IJ catheter tip in the region of the brachiocephalic SVC junction. No complicating features. Electronically Signed   By: MYRTIS Stammer M.D.   On: 11/12/2024 18:29   DG CHEST PORT 1 VIEW Result Date: 11/12/2024 EXAM: 1 VIEW(S) XRAY OF THE CHEST 11/12/2024 04:30:00 PM COMPARISON: 11/11/2024 CLINICAL HISTORY: Encounter for central line  placement [id number] FINDINGS: LINES, TUBES AND DEVICES: Right neck central line in place with tip within the region of the right axillary vein in the right axilla. LUNGS AND PLEURA: Mild left basilar atelectasis.  No pleural effusion. No pneumothorax. HEART AND MEDIASTINUM: Aortic arch calcifications. Coronary stent noted. No acute abnormality of the cardiac and mediastinal silhouettes. BONES AND SOFT TISSUES: No acute osseous abnormality. IMPRESSION: 1. Right IJ approach central line tip within the region of the right axillary vein. Repositioning recommended. No pneumothorax. These results will be called to the ordering clinician or representative by the radiologist assistant and communication documented in the pacs or clario dashboard. Electronically signed by: Rogelia Myers MD 11/12/2024 04:44 PM EST RP Workstation: [id number]   ECHOCARDIOGRAM COMPLETE Result Date: 11/12/2024    ECHOCARDIOGRAM REPORT   Patient Name:   ZOHAIR EPP [facility] Date of Exam: 11/12/2024 Medical Rec #:  [id number]      Height:       65.0 in Accession #:    [id number]     Weight:       166.9 lb Date of Birth:  02/14/1941       BSA:          1.832 m Patient Age:    84 years       BP:           87/64 mmHg Patient Gender: M              HR:           138 bpm. Exam Location:  Inpatient Procedure: 2D Echo, Color Doppler and Cardiac Doppler (Both Spectral and Color            Flow Doppler were utilized during procedure). Indications:    Shock  History:        Patient has prior history of Echocardiogram examinations, most                 recent 07/13/2021. CHF, CAD and Previous Myocardial Infarction,                 Signs/Symptoms:Edema and Murmur; Risk Factors:Hypertension,                 Diabetes, Dyslipidemia and Former Smoker.  Sonographer:    Juliene Rucks Referring Phys: [id number] PEYTON CROZIER  Sonographer Comments: Image acquisition challenging due to respiratory motion. IMPRESSIONS  1. Apical akinesis with overall preserved LV function.  2. Left  ventricular ejection fraction, by estimation, is 55 to 60%. The left ventricle has normal function. The left ventricle demonstrates regional wall motion abnormalities (see scoring diagram/findings for description). Left ventricular diastolic parameters are indeterminate.  3. Right ventricular systolic function is normal. The right ventricular size is normal.  4. The mitral valve is normal in structure. No evidence of mitral valve regurgitation. No evidence of mitral stenosis.  5. The aortic valve has an indeterminant number of cusps. Aortic valve regurgitation is not visualized. No aortic stenosis is present. FINDINGS  Left Ventricle: Left ventricular ejection fraction, by estimation, is 55 to 60%. The left ventricle has normal function. The left ventricle demonstrates regional wall motion abnormalities. The left ventricular internal cavity size was normal in size. There is no left ventricular hypertrophy. Left ventricular diastolic parameters are indeterminate. Right Ventricle: The right ventricular size is normal. Right ventricular systolic function is normal. Left Atrium: Left atrial size was normal in size. Right Atrium: Right atrial size was normal in size. Pericardium: There is no evidence of pericardial effusion. Mitral Valve: The mitral valve is normal in structure. Mild mitral annular calcification. No evidence of mitral valve regurgitation. No evidence of mitral valve stenosis. Tricuspid Valve: The tricuspid valve is normal in structure.  Tricuspid valve regurgitation is trivial. No evidence of tricuspid stenosis. Aortic Valve: The aortic valve has an indeterminant number of cusps. Aortic valve regurgitation is not visualized. No aortic stenosis is present. Pulmonic Valve: The pulmonic valve was normal in structure. Pulmonic valve regurgitation is not visualized. No evidence of pulmonic stenosis. Aorta: The aortic root is normal in size and structure. Venous: The inferior vena cava was not well visualized.  IAS/Shunts: The interatrial septum was not well visualized. Additional Comments: Apical akinesis with overall preserved LV function.  LEFT VENTRICLE PLAX 2D LVIDd:         4.70 cm   Diastology LVIDs:         3.70 cm   LV e' medial:    8.70 cm/s LV PW:         1.10 cm   LV E/e' medial:  10.1 LV IVS:        0.90 cm   LV e' lateral:   15.10 cm/s LVOT diam:     2.20 cm   LV E/e' lateral: 5.8 LV SV:         81 LV SV Index:   44 LVOT Area:     3.80 cm  RIGHT VENTRICLE RV Basal diam:  2.80 cm RV Mid diam:    2.50 cm TAPSE (M-mode): 1.4 cm LEFT ATRIUM             Index LA Vol (A2C):   57.9 ml 31.61 ml/m LA Vol (A4C):   51.6 ml 28.17 ml/m LA Biplane Vol: 54.8 ml 29.92 ml/m  AORTIC VALVE LVOT Vmax:   132.00 cm/s LVOT Vmean:  105.000 cm/s LVOT VTI:    0.213 m  AORTA Ao Root diam: 2.90 cm MITRAL VALVE MV Area (PHT): 3.89 cm    SHUNTS MV Decel Time: 195 msec    Systemic VTI:  0.21 m MV E velocity: 88.30 cm/s  Systemic Diam: 2.20 cm Redell Shallow MD Electronically signed by Redell Shallow MD Signature Date/Time: 11/12/2024/1:14:22 PM    Final    US  RENAL Result Date: 11/11/2024 CLINICAL DATA:  Renal failure EXAM: RENAL / URINARY TRACT ULTRASOUND COMPLETE COMPARISON:  11/11/2024 FINDINGS: Right Kidney: Renal measurements: 13.0 x 7.7 by 4.7 cm = volume: 248.2 mL. Moderate renal cortical thinning. Normal echotexture. Severe hydronephrosis similar to recent CT. Left Kidney: Renal measurements: 9.3 x 5.0 x 4.1 cm = volume: 101.3 mL. Moderate renal cortical atrophy. Normal renal cortical echotexture. Severe hydronephrosis similar to recent CT. Bladder: The bladder is decompressed by Foley catheter, with limited visualization. Marked bladder wall thickening and trabeculation were seen on preceding CT. Other: None. IMPRESSION: 1. Moderate bilateral renal cortical thinning. 2. Severe bilateral hydronephrosis, likely due to chronic bladder outlet obstruction from an enlarged prostate. 3. Nonvisualization of the bladder due to  decompression by Foley catheter. Please refer to recent CT exam for bladder findings. Electronically Signed   By: [name] Daring M.D.   On: 11/11/2024 16:18   CT Head Wo Contrast Result Date: 11/11/2024 CLINICAL DATA:  Head trauma EXAM: CT HEAD WITHOUT CONTRAST TECHNIQUE: Contiguous axial images were obtained from the base of the skull through the vertex without intravenous contrast. RADIATION DOSE REDUCTION: This exam was performed according to the departmental dose-optimization program which includes automated exposure control, adjustment of the mA and/or kV according to patient size and/or use of iterative reconstruction technique. COMPARISON:  04/11/2007 FINDINGS: Brain: Encephalomalacia related to prior right parietal infarct. No evidence of acute infarct or hemorrhage. Lateral ventricles and midline  structures are unremarkable. No acute extra-axial fluid collections. No mass effect. Vascular: No hyperdense vessel or unexpected calcification. Skull: Normal. Negative for fracture or focal lesion. Sinuses/Orbits: No acute finding. Other: None. IMPRESSION: 1. Chronic right parietal infarct. 2. No acute intracranial process. Electronically Signed   By: [name] Daring M.D.   On: 11/11/2024 14:58   CT ABDOMEN PELVIS WO CONTRAST Result Date: 11/11/2024 CLINICAL DATA:  Fall, abdominal trauma, evaluate for retroperitoneal hematoma. EXAM: CT ABDOMEN AND PELVIS WITHOUT CONTRAST TECHNIQUE: Multidetector CT imaging of the abdomen and pelvis was performed following the standard protocol without IV contrast. RADIATION DOSE REDUCTION: This exam was performed according to the departmental dose-optimization program which includes automated exposure control, adjustment of the mA and/or kV according to patient size and/or use of iterative reconstruction technique. COMPARISON:  None Available. FINDINGS: Lower chest: Possible mild basilar interstitial lung abnormality. Atherosclerotic calcification of the aorta, aortic valve  and coronary arteries. Heart is mildly enlarged. No pericardial or pleural effusion. Distal esophagus is grossly unremarkable. Hepatobiliary: Liver and gallbladder are unremarkable. No biliary ductal dilatation. Pancreas: Negative. Spleen: Negative. Adrenals/Urinary Tract: Adrenal glands are unremarkable. Severe bilateral hydronephrosis. Marked bladder wall thickening. Foley catheter and air in the bladder which is largely decompressed. Somewhat loculated perivesical fluid associated stranding. Stomach/Bowel: Stomach, small bowel, appendix and colon are unremarkable. Vascular/Lymphatic: Atherosclerotic calcification of the aorta. No pathologically enlarged lymph nodes. Reproductive: Enlarged prostate. Other: No free fluid. Musculoskeletal: Degenerative changes in the spine. No fracture. Mild levoconvex scoliosis. IMPRESSION: 1. Somewhat loculated appearing fluid around the bladder with inflammatory haziness and stranding, findings which may be due to trauma/contusion. Alternatively, given bladder wall thickening and an enlarged prostate, an element of outlet obstruction with associated cystitis could have this appearance. 2. Associated severe bilateral hydronephrosis. 3. Otherwise, no evidence of acute trauma. 4. Suspect mild basilar interstitial lung abnormality (ILA). Electronically Signed   By: Newell Eke M.D.   On: 11/11/2024 14:53   DG Chest Portable 1 View Result Date: 11/11/2024 CLINICAL DATA:  Weakness. EXAM: PORTABLE CHEST 1 VIEW COMPARISON:  04/11/2007 FINDINGS: Lungs are adequately inflated and otherwise clear. Cardiomediastinal silhouette and remainder of the exam is unchanged. IMPRESSION: No active disease. Electronically Signed   By: Toribio Agreste M.D.   On: 11/11/2024 13:47    Labs:  CBC: Recent Labs    11/15/24 0416 11/15/24 1659 11/16/24 0753 11/17/24 0156  WBC 13.3* 20.0* 15.5* 11.8*  HGB 8.5* 9.9* 9.5* 8.2*  HCT 24.7* 29.4* 29.2* 25.9*  PLT 172 200 214 195     COAGS: Recent Labs    11/14/24 0447  INR 1.3*    BMP: Recent Labs    11/15/24 0416 11/15/24 1659 11/16/24 0753 11/17/24 0156  NA 136 140 139 138  K 3.8 4.0 3.5 3.0*  CL 100 101 102 103  CO2 21* 16* 18* 20*  GLUCOSE 86 84 87 154*  BUN 122* 118* 119* 121*  CALCIUM  7.6* 8.0* 8.2* 7.6*  CREATININE 6.86* 6.74* 6.84* 6.68*  GFRNONAA 7* 8* 7* 8*    LIVER FUNCTION TESTS: Recent Labs    11/11/24 1425 11/11/24 1643 11/17/24 0156  BILITOT 0.3 0.2  --   AST 19 19  --   ALT 20 18  --   ALKPHOS 78 62  --   PROT 5.7* 4.9*  --   ALBUMIN 2.5* 2.2* 1.9*    TUMOR MARKERS: No results for input(s): AFPTM, CEA, CA199, CHROMGRNA in the last 8760 hours.  Assessment and Plan:  Scheduled for  Bilat percutaneous nephrostomy tube placements in IR today Risks and benefits of Bilateral PCN placement was discussed with the patient including, but not limited to, infection, bleeding, significant bleeding causing loss or decrease in renal function or damage to adjacent structures.   All of the patient's questions were answered, patient is agreeable to proceed.  Consent signed and in chart.  Thank you for this interesting consult.  I greatly enjoyed meeting KYLON PHILBROOK and look forward to participating in their care.  A copy of this report was sent to the requesting provider on this date.  Electronically Signed: Sharlet DELENA Candle, PA-C 11/17/2024, 1:16 PM   I spent a total of 40 Minutes    in face to face in clinical consultation, greater than 50% of which was counseling/coordinating care for B PCNs "

## 2024-11-17 NOTE — TOC Progression Note (Signed)
 Transition of Care [facility]) - Progression Note    Patient Details  Name: [name] MRN: [id number] Date of Birth: 1941/05/25  [facility] CM/SW Contact  [name], [name] Phone Number: 11/17/2024, 4:16 PM  Clinical Narrative:   CSW attempted to reach patient's son, [name], to discuss SNF recommendation; left a voicemail.    Expected Discharge Plan: Skilled Nursing Facility Barriers to Discharge: Continued Medical Work up, English As A Second Language Teacher               Expected Discharge Plan and Services In-house Referral: Clinical Social Work     Living arrangements for the past 2 months: Single Family Home                                       Social Drivers of Health (SDOH) Interventions SDOH Screenings   Food Insecurity: No Food Insecurity (11/11/2024)  Housing: Low Risk (11/11/2024)  Transportation Needs: No Transportation Needs (11/11/2024)  Utilities: Not At Risk (11/11/2024)  Social Connections: Moderately Integrated (11/11/2024)  Tobacco Use: Medium Risk (11/11/2024)    Readmission Risk Interventions     No data to display

## 2024-11-17 NOTE — Progress Notes (Signed)
 Patient ID: [name], male   DOB: 14-May-1941, 84 y.o.   MRN: [id number]  [facility] Progress Note   Assessment/ Plan:   1. Acute kidney Injury: Likely secondary to obstruction/obstructive uropathy.  Nonoliguric overnight with renal function that has essentially plateaued and raising concern for continued obstruction based on previous renal ultrasound (following Foley catheter placement for BOO).  Per discussions with urology, proceeding with plans for percutaneous nephrostomy to help alleviate urine obstruction.  No acute electrolyte abnormality/indications for dialysis. 2.  Urinary tract infection: With progressive BPH/bladder outlet obstruction.  On empiric antibiotic coverage with ceftriaxone  with urine cultures negative to date.  With renal function essentially unchanged/not improving as anticipated, plans noted for PCN. 3.  Anemia: Likely secondary to chronic illness.  Status post PRBC transfusion 11/13/2024 and without overt blood loss. 4.  Leukocytosis: Improving slowly on empiric ceftriaxone  therapy.  Urine cultures negative to date.  Subjective:   No acute events overnight, asks questions about renal function and percutaneous nephrostomy.   Objective:   BP (!) 94/44   Pulse 79   Temp 97.9 F (36.6 C) (Oral)   Resp 20   Ht 5' 5 (1.651 m)   Wt 85.1 kg   SpO2 96%   BMI 31.22 kg/m   Intake/Output Summary (Last 24 hours) at 11/17/2024 1025 Last data filed at 11/17/2024 0000 Gross per 24 hour  Intake --  Output 1000 ml  Net -1000 ml   Weight change: -0.5 kg  Physical Exam: Gen: Comfortably resting in bed, alert/oriented.  Daughter at bedside CVS: Pulse regular rhythm, normal rate, S1 and S2 normal Resp: Clear to auscultation, no rales/rhonchi Abd: Soft, flat, nontender, bowel sounds normal Ext: No lower extremity edema  Imaging: DG Abd 1 View Result Date: 11/16/2024 EXAM: 1 VIEW XRAY OF THE ABDOMEN 11/16/2024 12:32:00 AM COMPARISON: 11/11/2024 CT CLINICAL  HISTORY: Distended abdomen. FINDINGS: BOWEL: Scattered large and small bowel gas is noted. Dilatation of the small bowel is noted consistent with at least a partial small bowel obstruction. No definitive free air is seen. SOFT TISSUES: No abnormal calcifications. BONES: Degenerative changes of the lumbar spine are noted. No acute fracture. IMPRESSION: 1. Dilatation of the small bowel consistent with at least a partial small bowel obstruction. These findings are new from the prior CT examination. 2. No definitive free air. Electronically signed by: Oneil Devonshire MD 11/16/2024 12:38 AM EST RP Workstation: [id number]    Labs: BMET Recent Labs  Lab 11/12/24 0500 11/12/24 1813 11/13/24 9567 11/13/24 1325 11/13/24 1626 11/14/24 0447 11/14/24 2000 11/15/24 0416 11/15/24 1659 11/16/24 0753 11/17/24 0156  NA 132*   < > 132*   < > 132* 134* 134* 136 140 139 138  K 4.1   < > 3.6   < > 3.6 3.5 4.2 3.8 4.0 3.5 3.0*  CL 101   < > 96*   < > 95* 97* 99 100 101 102 103  CO2 15*   < > 21*   < > 22 21* 21* 21* 16* 18* 20*  GLUCOSE 219*   < > 165*   < > 126* 116* 95 86 84 87 154*  BUN 161*   < > 136*   < > 131* 129* 122* 122* 118* 119* 121*  CREATININE 9.70*   < > 7.98*   < > 7.55* 7.26* 7.11* 6.86* 6.74* 6.84* 6.68*  CALCIUM  7.5*   < > 7.2*   < > 7.4* 7.6* 7.6* 7.6* 8.0* 8.2* 7.6*  PHOS 4.0  --  4.0  --   --  4.9*  --  5.4*  --  6.3* 5.1*   < > = values in this interval not displayed.   CBC Recent Labs  Lab 11/11/24 1230 11/11/24 1232 11/15/24 0416 11/15/24 1659 11/16/24 0753 11/17/24 0156  WBC 20.7*   < > 13.3* 20.0* 15.5* 11.8*  NEUTROABS 18.9*  --   --   --   --   --   HGB 9.2*   < > 8.5* 9.9* 9.5* 8.2*  HCT 28.7*   < > 24.7* 29.4* 29.2* 25.9*  MCV 102.1*   < > 95.4 98.0 98.3 101.2*  PLT 276   < > 172 200 214 195   < > = values in this interval not displayed.    Medications:     Chlorhexidine  Gluconate Cloth  6 each Topical Daily   feeding supplement  1 Container Oral BID BM    feeding supplement  237 mL Oral BID BM   heparin  injection (subcutaneous)  5,000 Units Subcutaneous Q8H   insulin  aspart  0-9 Units Subcutaneous TID WC   midodrine   10 mg Oral TID WC   mirtazapine   15 mg Oral QHS   multivitamin  1 tablet Oral QHS   pantoprazole   40 mg Oral BID    Gordy Blanch, MD 11/17/2024, 10:25 AM

## 2024-11-17 NOTE — Progress Notes (Signed)
 Physical Therapy Treatment Patient Details Name: [name] MRN: [id number] DOB: [birth year] Today's Date: 11/17/2024   History of Present Illness 84 y.o. male admitted 11/11/24 with weakness, fatigue, decreased appetite, decreased urine output. Workup for severe AKI, septic shock secondary to UTI, severe bilateral hydronephrosis. Transfer out of ICU 1/18. Imaging 1/19 consistent with partial SBO. PMH includes HTN, HLD, DM, NSTEMI, ischemic cardiomyopathy, HF, CVA, heart murmur.    PT Comments  Pt received in supine and agreeable to session. Pt able to complete bed mobility and standing with CGA for safety. Pt able to tolerate extended time standing for BP to be taken, but then requires a seated rest break. Pt able to perform additional stand with lateral steps and standing marches with no buckling or LOB, but is limited by quick fatigue. Pt reports dizziness that increases in standing and improves once sitting with noted BP drop. Pt continues to benefit from PT services to progress toward functional mobility goals.    If plan is discharge home, recommend the following: A little help with walking and/or transfers;A little help with bathing/dressing/bathroom;Assistance with cooking/housework;Assist for transportation;Help with stairs or ramp for entrance   Can travel by private vehicle     Yes  Equipment Recommendations   (TBD - potential RW vs rollator)    Recommendations for Other Services       Precautions / Restrictions Precautions Precautions: Fall Recall of Precautions/Restrictions: Intact Precaution/Restrictions Comments: watch BP Restrictions Weight Bearing Restrictions Per Provider Order: No     Mobility  Bed Mobility Overal bed mobility: Needs Assistance Bed Mobility: Supine to Sit, Sit to Supine     Supine to sit: Supervision, HOB elevated, Used rails Sit to supine: Min assist, Used rails   General bed mobility comments: increased time and effort to get to EOB. Min A  for BLE elevation back to EOB    Transfers Overall transfer level: Needs assistance Equipment used: Rolling walker (2 wheels) Transfers: Sit to/from Stand Sit to Stand: Contact guard assist           General transfer comment: STS from EOB x2 with cues for hand placement and CGA for safety. Pt able to take lateral steps towards [facility] with CGA    Ambulation/Gait               General Gait Details: unable to progress due to dizziness and weakness   Stairs             Wheelchair Mobility     Tilt Bed    Modified Rankin (Stroke Patients Only)       Balance Overall balance assessment: Needs assistance Sitting-balance support: No upper extremity supported, Feet supported Sitting balance-Leahy Scale: Good Sitting balance - Comments: EOB   Standing balance support: During functional activity, Bilateral upper extremity supported Standing balance-Leahy Scale: Fair Standing balance comment: with RW support                            Communication Communication Communication: No apparent difficulties  Cognition Arousal: Alert Behavior During Therapy: WFL for tasks assessed/performed   PT - Cognitive impairments: Problem solving                       PT - Cognition Comments: increased time for problem solving, but good awareness Following commands: Intact Following commands impaired: Only follows one step commands consistently, Follows multi-step commands with increased time    Cueing Cueing  Techniques: Verbal cues, Gestural cues  Exercises General Exercises - Lower Extremity Hip Flexion/Marching: AROM, Standing, Both, 10 reps    General Comments General comments (skin integrity, edema, etc.): BP 104/46 sitting EOB, 82/47 standing, and 93/49 in supine at end of session      Pertinent Vitals/Pain Pain Assessment Pain Assessment: Faces Faces Pain Scale: No hurt     PT Goals (current goals can now be found in the care plan section)  Acute Rehab PT Goals Patient Stated Goal: return home; children interested in SNF rehab PT Goal Formulation: With patient Time For Goal Achievement: 11/30/24 Progress towards PT goals: Progressing toward goals    Frequency    Min 3X/week       AM-PAC PT 6 Clicks Mobility   Outcome Measure  Help needed turning from your back to your side while in a flat bed without using bedrails?: A Little Help needed moving from lying on your back to sitting on the side of a flat bed without using bedrails?: A Little Help needed moving to and from a bed to a chair (including a wheelchair)?: A Little Help needed standing up from a chair using your arms (e.g., wheelchair or bedside chair)?: A Little Help needed to walk in hospital room?: Total Help needed climbing 3-5 steps with a railing? : Total 6 Click Score: 14    End of Session Equipment Utilized During Treatment: Gait belt Activity Tolerance: Patient tolerated treatment well;Patient limited by fatigue Patient left: with call bell/phone within reach;in bed;with family/visitor present Nurse Communication: Mobility status PT Visit Diagnosis: Other abnormalities of gait and mobility (R26.89);Muscle weakness (generalized) (M62.81)     Time: 8554-8490 PT Time Calculation (min) (ACUTE ONLY): 24 min  Charges:    $Therapeutic Activity: 23-37 mins PT General Charges $$ ACUTE PT VISIT: 1 Visit                    Darryle George, PTA Acute Rehabilitation Services Secure Chat Preferred  [phone]    Darryle George 11/17/2024, 3:21 PM

## 2024-11-17 NOTE — Progress Notes (Signed)
 Dr. Franky notified of results. No new orders at this time.

## 2024-11-17 NOTE — Progress Notes (Signed)
 Dr. Franky notified about [facility] results. No new orders at this time.

## 2024-11-18 ENCOUNTER — Inpatient Hospital Stay (HOSPITAL_COMMUNITY)

## 2024-11-18 DIAGNOSIS — N170 Acute kidney failure with tubular necrosis: Secondary | ICD-10-CM | POA: Diagnosis not present

## 2024-11-18 HISTORY — PX: IR NEPHROSTOMY PLACEMENT RIGHT: IMG6064

## 2024-11-18 LAB — CBC
HCT: 26.5 % — ABNORMAL LOW (ref 39.0–52.0)
Hemoglobin: 8.3 g/dL — ABNORMAL LOW (ref 13.0–17.0)
MCH: 31.8 pg (ref 26.0–34.0)
MCHC: 31.3 g/dL (ref 30.0–36.0)
MCV: 101.5 fL — ABNORMAL HIGH (ref 80.0–100.0)
Platelets: 214 K/uL (ref 150–400)
RBC: 2.61 MIL/uL — ABNORMAL LOW (ref 4.22–5.81)
RDW: 16.8 % — ABNORMAL HIGH (ref 11.5–15.5)
WBC: 10.9 K/uL — ABNORMAL HIGH (ref 4.0–10.5)
nRBC: 0 % (ref 0.0–0.2)

## 2024-11-18 LAB — RENAL FUNCTION PANEL
Albumin: 2.2 g/dL — ABNORMAL LOW (ref 3.5–5.0)
Anion gap: 14 (ref 5–15)
BUN: 114 mg/dL — ABNORMAL HIGH (ref 8–23)
CO2: 22 mmol/L (ref 22–32)
Calcium: 7.9 mg/dL — ABNORMAL LOW (ref 8.9–10.3)
Chloride: 107 mmol/L (ref 98–111)
Creatinine, Ser: 6.48 mg/dL — ABNORMAL HIGH (ref 0.61–1.24)
GFR, Estimated: 8 mL/min — ABNORMAL LOW
Glucose, Bld: 96 mg/dL (ref 70–99)
Phosphorus: 5 mg/dL — ABNORMAL HIGH (ref 2.5–4.6)
Potassium: 3.2 mmol/L — ABNORMAL LOW (ref 3.5–5.1)
Sodium: 143 mmol/L (ref 135–145)

## 2024-11-18 LAB — BASIC METABOLIC PANEL WITH GFR
Anion gap: 13 (ref 5–15)
BUN: 115 mg/dL — ABNORMAL HIGH (ref 8–23)
CO2: 22 mmol/L (ref 22–32)
Calcium: 7.9 mg/dL — ABNORMAL LOW (ref 8.9–10.3)
Chloride: 107 mmol/L (ref 98–111)
Creatinine, Ser: 6.52 mg/dL — ABNORMAL HIGH (ref 0.61–1.24)
GFR, Estimated: 8 mL/min — ABNORMAL LOW
Glucose, Bld: 99 mg/dL (ref 70–99)
Potassium: 3.2 mmol/L — ABNORMAL LOW (ref 3.5–5.1)
Sodium: 143 mmol/L (ref 135–145)

## 2024-11-18 LAB — GLUCOSE, CAPILLARY: Glucose-Capillary: 123 mg/dL — ABNORMAL HIGH (ref 70–99)

## 2024-11-18 LAB — MAGNESIUM: Magnesium: 2 mg/dL (ref 1.7–2.4)

## 2024-11-18 MED ORDER — SODIUM CHLORIDE 0.9% FLUSH
10.0000 mL | Freq: Two times a day (BID) | INTRAVENOUS | Status: DC
  Administered 2024-11-18 – 2024-11-24 (×12): 10 mL

## 2024-11-18 MED ORDER — LIDOCAINE HCL 1 % IJ SOLN
20.0000 mL | Freq: Once | INTRAMUSCULAR | Status: AC
  Administered 2024-11-18: 20 mL via INTRADERMAL
  Filled 2024-11-18: qty 20

## 2024-11-18 MED ORDER — MIDAZOLAM HCL 2 MG/2ML IJ SOLN
INTRAMUSCULAR | Status: AC
  Filled 2024-11-18: qty 2

## 2024-11-18 MED ORDER — SODIUM CHLORIDE 0.9 % IV SOLN
INTRAVENOUS | Status: AC | PRN
  Administered 2024-11-18: 2 g via INTRAVENOUS

## 2024-11-18 MED ORDER — SODIUM CHLORIDE 0.9% FLUSH: 10.0000 mL | INTRAVENOUS | Status: DC | PRN

## 2024-11-18 MED ORDER — IOHEXOL 300 MG/ML  SOLN
50.0000 mL | Freq: Once | INTRAMUSCULAR | Status: AC | PRN
  Administered 2024-11-18: 15 mL

## 2024-11-18 MED ORDER — POTASSIUM CHLORIDE CRYS ER 20 MEQ PO TBCR: 30.0000 meq | EXTENDED_RELEASE_TABLET | Freq: Once | ORAL | Status: DC

## 2024-11-18 MED ORDER — ASPIRIN 81 MG PO TBEC
81.0000 mg | DELAYED_RELEASE_TABLET | Freq: Every day | ORAL | Status: DC
  Administered 2024-11-18: 81 mg via ORAL
  Filled 2024-11-18: qty 1

## 2024-11-18 MED ORDER — MIDAZOLAM HCL (PF) 2 MG/2ML IJ SOLN
INTRAMUSCULAR | Status: AC | PRN
  Administered 2024-11-18: .5 mg via INTRAVENOUS

## 2024-11-18 MED ORDER — SACCHAROMYCES BOULARDII 250 MG PO CAPS
250.0000 mg | ORAL_CAPSULE | Freq: Two times a day (BID) | ORAL | Status: DC
  Administered 2024-11-18 – 2024-12-02 (×29): 250 mg via ORAL
  Filled 2024-11-18 (×29): qty 1

## 2024-11-18 MED ORDER — FENTANYL CITRATE (PF) 100 MCG/2ML IJ SOLN
INTRAMUSCULAR | Status: AC
  Filled 2024-11-18: qty 2

## 2024-11-18 MED ORDER — SIMETHICONE 80 MG PO CHEW
80.0000 mg | CHEWABLE_TABLET | Freq: Four times a day (QID) | ORAL | Status: DC
  Administered 2024-11-18 – 2024-12-02 (×54): 80 mg via ORAL
  Filled 2024-11-18 (×55): qty 1

## 2024-11-18 MED ORDER — POTASSIUM CHLORIDE 10 MEQ/100ML IV SOLN: 10.0000 meq | INTRAVENOUS | Status: DC

## 2024-11-18 MED ORDER — POTASSIUM CHLORIDE 10 MEQ/100ML IV SOLN
10.0000 meq | INTRAVENOUS | Status: AC
  Administered 2024-11-18 (×2): 10 meq via INTRAVENOUS
  Filled 2024-11-18 (×3): qty 100

## 2024-11-18 MED ORDER — GERHARDT'S BUTT CREAM
1.0000 | TOPICAL_CREAM | Freq: Three times a day (TID) | CUTANEOUS | Status: DC
  Administered 2024-11-18 – 2024-12-02 (×41): 1 via TOPICAL
  Filled 2024-11-18 (×3): qty 60

## 2024-11-18 MED ORDER — POTASSIUM CHLORIDE CRYS ER 20 MEQ PO TBCR: 40.0000 meq | EXTENDED_RELEASE_TABLET | Freq: Once | ORAL | Status: DC

## 2024-11-18 MED ORDER — SODIUM CHLORIDE 0.9 % IV SOLN
INTRAVENOUS | Status: AC
  Filled 2024-11-18: qty 20

## 2024-11-18 MED ORDER — LIDOCAINE HCL 1 % IJ SOLN
INTRAMUSCULAR | Status: AC
  Filled 2024-11-18: qty 40

## 2024-11-18 MED ORDER — POTASSIUM CHLORIDE 10 MEQ/100ML IV SOLN
10.0000 meq | INTRAVENOUS | Status: DC
  Administered 2024-11-18 (×2): 10 meq via INTRAVENOUS
  Filled 2024-11-18: qty 100

## 2024-11-18 NOTE — Progress Notes (Signed)
 OT Cancellation Note  Patient Details Name: [name] MRN: [id number] DOB: [birth year]   Cancelled Treatment:    Reason Eval/Treat Not Completed: Patient at procedure or test/ unavailable. Pt at renal procedure. Will attempt back as schedule allows.  [name] 11/18/2024, 12:04 PM

## 2024-11-18 NOTE — Plan of Care (Signed)

## 2024-11-18 NOTE — NC FL2 (Signed)
 " [place]  MEDICAID FL2 LEVEL OF CARE FORM     IDENTIFICATION  Patient Name: [name] Birthdate: 1940-11-18 Sex: male Admission Date (Current Location): 11/11/2024  [facility] and [place] Number:  Producer, Television/film/video and Address:  [facility], [address] [place], [name] [address]      Provider Number: [id number]  Attending Physician Name and Address:  Tobie Yetta CHRISTELLA, MD  Relative Name and Phone Number:  Juda Toepfer- [phone] (mobile)    Current Level of Care: Hospital Recommended Level of Care: Skilled Nursing Facility Prior Approval Number:    Date Approved/Denied:   PASRR Number:    Discharge Plan: SNF    Current Diagnoses: Patient Active Problem List   Diagnosis Date Noted   Malnutrition of moderate degree 11/15/2024   Hypotension 11/13/2024   Metabolic acidosis 11/13/2024   Sepsis (HCC) 11/13/2024   Urinary retention 11/13/2024   Urinary tract infection with hematuria 11/13/2024   Pressure injury of skin 11/13/2024   Renal failure 11/11/2024   Kidney failure, acute 11/11/2024   Chronic combined systolic and diastolic heart failure (HCC) 10/26/2016   Obese 05/02/2016   Type 2 diabetes mellitus with obesity 04/23/2014   Hypertension    Hyperlipidemia    Myocardial infarction [facility])    Coronary artery disease    CAD S/P LAD PCI 2008 02/25/2012    Orientation RESPIRATION BLADDER Height & Weight     Self, Situation, Place, Time  Normal Continent Weight: 184 lb 4.9 oz (83.6 kg) Height:  5' 5 (165.1 cm)  BEHAVIORAL SYMPTOMS/MOOD NEUROLOGICAL BOWEL NUTRITION STATUS      Incontinent Diet  AMBULATORY STATUS COMMUNICATION OF NEEDS Skin   Limited Assist Verbally PU Stage and Appropriate Care (Pressure injury stage 2 mid coccyx, Pressure injury mid back, pressure injury posterior head)                       Personal Care Assistance Level of Assistance  Bathing, Feeding, Dressing Bathing Assistance: Limited  assistance Feeding assistance: Limited assistance Dressing Assistance: Limited assistance     Functional Limitations Info  Hearing   Hearing Info: Impaired      SPECIAL CARE FACTORS FREQUENCY  PT (By licensed PT), OT (By licensed OT)     PT Frequency: 5x/week OT Frequency: 5x/week            Contractures Contractures Info: Not present    Additional Factors Info  Code Status, Allergies, Insulin  Sliding Scale Code Status Info: Full Allergies Info: NKA   Insulin  Sliding Scale Info: see d/c summary       Current Medications (11/18/2024):  This is the current hospital active medication list Current Facility-Administered Medications  Medication Dose Route Frequency Provider Last Rate Last Admin   acetaminophen  (TYLENOL ) tablet 650 mg  650 mg Oral Q4H PRN Crozier, Peyton, PA-C   650 mg at 11/12/24 1202   Chlorhexidine  Gluconate Cloth 2 % PADS 6 each  6 each Topical Daily Crozier, Peyton, PA-C   6 each at 11/17/24 9075   feeding supplement (BOOST / RESOURCE BREEZE) liquid 1 Container  1 Container Oral BID BM Ghimire, Shanker M, MD   1 Container at 11/17/24 2100   feeding supplement (ENSURE PLUS HIGH PROTEIN) liquid 237 mL  237 mL Oral BID BM Gatha Pence, MD   237 mL at 11/16/24 1403   Gerhardt's butt cream 1 Application  1 Application Topical TID Tobie Yetta CHRISTELLA, MD  heparin  injection 5,000 Units  5,000 Units Subcutaneous Q8H Turpin, Pamela, PA-C   5,000 Units at 11/17/24 2137   insulin  aspart (novoLOG ) injection 0-9 Units  0-9 Units Subcutaneous TID WC Kassie Acquanetta Bradley, MD   1 Units at 11/16/24 1603   midodrine  (PROAMATINE ) tablet 10 mg  10 mg Oral TID WC Raenelle Donalda HERO, MD   10 mg at 11/18/24 9144   mirtazapine  (REMERON  SOL-TAB) disintegrating tablet 15 mg  15 mg Oral QHS Syeda, Raeeha, DO   15 mg at 11/17/24 2138   multivitamin (RENA-VIT) tablet 1 tablet  1 tablet Oral QHS Mohammed, Shahid, MD       ondansetron  (ZOFRAN ) injection 4 mg  4 mg Intravenous Q6H PRN  Paliwal, Aditya, MD       pantoprazole  (PROTONIX ) EC tablet 40 mg  40 mg Oral BID Ghimire, Shanker M, MD   40 mg at 11/17/24 2138   polyethylene glycol (MIRALAX  / GLYCOLAX ) packet 17 g  17 g Oral Daily PRN Sharie Bourbon, MD       potassium chloride  (KLOR-CON  M) CR tablet 30 mEq  30 mEq Oral Once Patel, Pranav M, MD       potassium chloride  SA (KLOR-CON  M) CR tablet 40 mEq  40 mEq Oral Once Patel, Jay K, MD       senna (SENOKOT) tablet 8.6 mg  1 tablet Oral BID PRN Sharie Bourbon, MD       simethicone  (MYLICON) chewable tablet 80 mg  80 mg Oral QID PRN Paliwal, Ria, MD   80 mg at 11/17/24 1920   sodium chloride  flush (NS) 0.9 % injection 10-40 mL  10-40 mL Intracatheter Q12H Ghimire, Donalda HERO, MD       sodium chloride  flush (NS) 0.9 % injection 10-40 mL  10-40 mL Intracatheter PRN Ghimire, Donalda HERO, MD         Discharge Medications: Please see discharge summary for a list of discharge medications.  Relevant Imaging Results:  Relevant Lab Results:   Additional Information SSN:[id number]  Sharyne Drum, Student-Social Work     "

## 2024-11-18 NOTE — Progress Notes (Signed)
 [facility] Progress Note Patient: [name] FMW:[id number] DOB: [birth year]  DOA: 11/11/2024 DOS: the patient was seen and examined on 11/18/2024  Brief Summary: Patient is a 84 y.o.  male with history of CAD s/p PCI 2008, HFmrEF, HTN, HLD-presented with generalized weakness/fatigue-loss of appetite-found to be hypotensive with AKI-thought to have obstructive uropathy.  Initially admitted to the ICU for pressors-stabilized and subsequently transferred to [facility].   Significant events: 1/14>> admit to ICU 1/19>> transferred to [facility].   Significant studies: 1/14>> CXR: No PNA 1/14>> CT head: No acute intracranial process 1/14>> CT abdomen/pelvis: Severe bilateral hydronephrosis 1/14>> renal ultrasound: Severe bilateral hydronephrosis. 1/15>> echo: EF 55-60% 1/17>> renal ultrasound: Severe bilateral hydronephrosis 1/19>> x-ray KUB: Dilated loops of small bowel-at least consistent with a partial SBO.   Significant microbiology data: 1/14>> COVID/influenza/RSV PCR: Negative 1/14>> blood culture: No growth 1/15>> urine culture: No growth   Procedures: None   Consults: PCCM Nephrology Neurology  Assessment and Plan: AKI Secondary to obstructive uropathy Although renal function improved-it seems to have plateaued over the past several days Foley catheter in place With nephrology/urology-IR consulted for percutaneous nephrostomy-as patient continues to have persistent hydronephrosis bilaterally with no improvement in his renal function over the past 3 days.   Hyperkalemia Hypokalemia Metabolic acidosis Secondary to AKI and poor p.o. intake. Monitor daily electrolyte. Maintain K more than 4.  Mag more than 2.   Multifactorial shock Secondary to septic shock due to UTI and hypovolemic shock from poor oral intake BP has stabilized with supportive care Briefly required pressors on initial presentation Although BP better-remains soft-starting today and following.   Complicated  UTI Continue Rocephin -although urine cultures negative-this was done after patient was given antibiotics   Ileus Patient with significant distention of his bowels. No definitive obstruction seen. Actually has a bowel movement and passing gas. Aggressively treating ileus with correction of electrolytes, MiraLAX . May initiate Reglan if continues to have issues.   GERD Dyspeptic symptoms are much better Continue PPI As needed simethicone .   Normocytic anemia Likely secondary to critical illness-no evidence of blood loss Follow CBC   History of CAD s/p PCI 2008 No anginal symptoms   Chronic HFmrEF Euvolemic Diuretics on hold   HTN Presented with hypotension-all antihypertensives on hold-resume when able   DM-2 (A1c 6.4 on 1/15) CBG stable-SSI   Anorexia/failure to thrive syndrome Poor oral intake for the past several months Patient not keen on getting NG tube placed Thankfully slowly improving-continue to encourage oral intake-continue supplements Remains on Remeron  Palliative care following.  Moderate Malnutrition Etiology: acute illness Signs/Symptoms: mild fat depletion, mild muscle depletion Interventions: Refer to RD note for recommendations   Pressure Ulcer: Agree with assessment as outlined below  Obesity Class 1 Body mass index is 30.67 kg/m.  Placing the pt at higher risk of poor outcomes.  Stage II coccyx ulcer, upper back pressure ulcers. Present on admission. Monitor.    Code Status: Full Code   DVT Prophylaxis: heparin  injection 5,000 Units Start: 11/17/24 1503 SCDs Start: 11/11/24 1516  Data review I have Reviewed nursing notes, Vitals, and Lab results. Since last encounter, pertinent lab results CBC and BMP   . I have ordered test including CBC and BMP  . I have discussed pt's care plan and test results with nephrology  . I have ordered imaging x-ray abdomen  .   Family Communication: Family at bedside  Disposition Plan: Status is:  Inpatient Remains inpatient appropriate because: Monitor for improvement in renal function as well  as oral intake   Planned Discharge Destination: SNF Diet: Diet Order             Diet 2 gram sodium Room service appropriate? Yes; Fluid consistency: Thin  Diet effective now                   MEDICATIONS: Scheduled Meds:  aspirin  EC  81 mg Oral QHS   Chlorhexidine  Gluconate Cloth  6 each Topical Daily   feeding supplement  1 Container Oral BID BM   feeding supplement  237 mL Oral BID BM   Gerhardt's butt cream  1 Application Topical TID   heparin  injection (subcutaneous)  5,000 Units Subcutaneous Q8H   insulin  aspart  0-9 Units Subcutaneous TID WC   midodrine   10 mg Oral TID WC   mirtazapine   15 mg Oral QHS   multivitamin  1 tablet Oral QHS   pantoprazole   40 mg Oral BID   saccharomyces boulardii  250 mg Oral BID   simethicone   80 mg Oral QID   sodium chloride  flush  10-40 mL Intracatheter Q12H   Continuous Infusions:  potassium chloride  10 mEq (11/18/24 1735)   PRN Meds:.acetaminophen , ondansetron  (ZOFRAN ) IV, polyethylene glycol, senna, simethicone , sodium chloride  flush  Author: Yetta Blanch, MD  [facility] 11/18/2024  6:34 PM Between 7PM-7AM, please contact night-coverage, check [email or web address] for on call.

## 2024-11-18 NOTE — Procedures (Signed)
 Interventional Radiology Procedure:   Indications:  Bilateral hydronephrosis  Procedure: Placement of bilateral nephrostomy tubes  Findings: Moderate hydronephrosis bilaterally.  Bilateral 10 Fr nephrostomy tubes placed.   Complications: None     EBL: Minimal  Plan: Follow output.    Kyrielle Urbanski R. Philip, MD  Pager: [phone]

## 2024-11-18 NOTE — [place] FL2 (Signed)
 " [place]  MEDICAID FL2 LEVEL OF CARE FORM     IDENTIFICATION  Patient Name: [name] Birthdate: 06-Mar-1941 Sex: male Admission Date (Current Location): 11/11/2024  [name], [name]:  [name], [name] and Address:  [name], [name], [name], [name]      [name]: [id number]  Attending Physician Name and Address:  Guy Yetta CHRISTELLA, MD  Relative Name and Phone Klein:  [name]- [phone] (mobile)    Current Level of Care: Klein Recommended Level of Care: Skilled Nursing Facility Prior Approval Klein:    Date Approved/Denied:   [name]: [id number]  Discharge Plan: SNF    Current Diagnoses: Patient Active Problem List   Diagnosis Date Noted   Malnutrition of moderate degree 11/15/2024   Hypotension 11/13/2024   Metabolic acidosis 11/13/2024   Sepsis (HCC) 11/13/2024   Urinary retention 11/13/2024   Urinary tract infection with hematuria 11/13/2024   Pressure injury of skin 11/13/2024   Renal failure 11/11/2024   Kidney failure, acute 11/11/2024   Chronic combined systolic and diastolic heart failure (HCC) 10/26/2016   Obese 05/02/2016   Type 2 diabetes mellitus with obesity 04/23/2014   Hypertension    Hyperlipidemia    Myocardial infarction [facility])    Coronary artery disease    CAD S/P LAD PCI 2008 02/25/2012    Orientation RESPIRATION BLADDER Height & Weight     Self, Situation, Place, Time  Normal Continent Weight: 184 lb 4.9 oz (83.6 kg) Height:  5' 5 (165.1 cm)  BEHAVIORAL SYMPTOMS/MOOD NEUROLOGICAL BOWEL NUTRITION STATUS      Incontinent Diet (See dc summary)  AMBULATORY STATUS COMMUNICATION OF NEEDS Skin   Limited Assist Verbally PU Stage and Appropriate Care (Pressure injury stage 2 mid coccyx, Pressure injury mid back, pressure injury posterior head)                       Personal Care Assistance Level of Assistance  Bathing, Feeding, Dressing Bathing  Assistance: Limited assistance Feeding assistance: Limited assistance Dressing Assistance: Limited assistance     Functional Limitations Info  Hearing   Hearing Info: Impaired      SPECIAL CARE FACTORS FREQUENCY  PT (By licensed PT), OT (By licensed OT)     PT Frequency: 5x/week OT Frequency: 5x/week            Contractures Contractures Info: Not present    Additional Factors Info  Code Status, Allergies, Insulin  Sliding Scale Code Status Info: Full Allergies Info: NKA   Insulin  Sliding Scale Info: see d/c summary       Current Medications (11/18/2024):  This is Guy current Klein active medication list Current Facility-Administered Medications  Medication Dose Route Frequency Guy Last Rate Last Admin   acetaminophen  (TYLENOL ) tablet 650 mg  650 mg Oral Q4H PRN [name], Peyton, [name]   650 mg at 11/12/24 1202   Chlorhexidine  Gluconate Cloth 2 % PADS 6 each  6 each Topical Daily [name], Peyton, [name]   6 each at 11/17/24 9075   feeding supplement (BOOST / RESOURCE BREEZE) liquid 1 Container  1 Container Oral BID BM Ghimire, Shanker M, MD   1 Container at 11/17/24 2100   feeding supplement (ENSURE PLUS HIGH PROTEIN) liquid 237 mL  237 mL Oral BID BM Gatha Pence, MD   237 mL at 11/16/24 1403   Gerhardt's butt cream 1 Application  1 Application Topical TID Guy Yetta CHRISTELLA, MD  heparin  injection 5,000 Units  5,000 Units Subcutaneous Q8H Turpin, Pamela, [name]   5,000 Units at 11/17/24 2137   insulin  aspart (novoLOG ) injection 0-9 Units  0-9 Units Subcutaneous TID WC Kassie Acquanetta Bradley, MD   1 Units at 11/16/24 1603   midodrine  (PROAMATINE ) tablet 10 mg  10 mg Oral TID WC Ghimire, Shanker M, MD   10 mg at 11/18/24 9144   mirtazapine  (REMERON  SOL-TAB) disintegrating tablet 15 mg  15 mg Oral QHS Syeda, Raeeha, DO   15 mg at 11/17/24 2138   multivitamin (RENA-VIT) tablet 1 tablet  1 tablet Oral QHS Mohammed, Shahid, MD       ondansetron  (ZOFRAN ) injection 4 mg  4 mg  Intravenous Q6H PRN Paliwal, Ria, MD   4 mg at 11/18/24 1201   pantoprazole  (PROTONIX ) EC tablet 40 mg  40 mg Oral BID Ghimire, Shanker M, MD   40 mg at 11/17/24 2138   polyethylene glycol (MIRALAX  / GLYCOLAX ) packet 17 g  17 g Oral Daily PRN Sharie Bourbon, MD   17 g at 11/18/24 1201   potassium chloride  (KLOR-CON  M) CR tablet 30 mEq  30 mEq Oral Once Patel, Pranav M, MD       potassium chloride  SA (KLOR-CON  M) CR tablet 40 mEq  40 mEq Oral Once Patel, Jay K, MD       senna (SENOKOT) tablet 8.6 mg  1 tablet Oral BID PRN Sharie Bourbon, MD       simethicone  (MYLICON) chewable tablet 80 mg  80 mg Oral QID PRN Paliwal, Aditya, MD   80 mg at 11/17/24 1920   simethicone  (MYLICON) chewable tablet 80 mg  80 mg Oral QID Patel, Pranav M, MD       sodium chloride  flush (NS) 0.9 % injection 10-40 mL  10-40 mL Intracatheter Q12H Ghimire, Donalda HERO, MD       sodium chloride  flush (NS) 0.9 % injection 10-40 mL  10-40 mL Intracatheter PRN Ghimire, Donalda HERO, MD         Discharge Medications: Please see discharge summary for a list of discharge medications.  Relevant Imaging Results:  Relevant Lab Results:   Additional Information SSN:[id number]  Inocente GORMAN Kindle, LCSW     "

## 2024-11-18 NOTE — Plan of Care (Signed)
 Patient is progressing towards goals of care.     Problem: Education: Goal: Knowledge of General Education information will improve Description: Including pain rating scale, medication(s)/side effects and non-pharmacologic comfort measures Outcome: Progressing   Problem: Health Behavior/Discharge Planning: Goal: Ability to manage health-related needs will improve Outcome: Progressing   Problem: Clinical Measurements: Goal: Ability to maintain clinical measurements within normal limits will improve Outcome: Progressing Goal: Will remain free from infection Outcome: Progressing Goal: Diagnostic test results will improve Outcome: Progressing Goal: Respiratory complications will improve Outcome: Progressing Goal: Cardiovascular complication will be avoided Outcome: Progressing   Problem: Activity: Goal: Risk for activity intolerance will decrease Outcome: Progressing   Problem: Nutrition: Goal: Adequate nutrition will be maintained Outcome: Progressing   Problem: Coping: Goal: Level of anxiety will decrease Outcome: Progressing   Problem: Elimination: Goal: Will not experience complications related to bowel motility Outcome: Progressing Goal: Will not experience complications related to urinary retention Outcome: Progressing   Problem: Pain Managment: Goal: General experience of comfort will improve and/or be controlled Outcome: Progressing   Problem: Safety: Goal: Ability to remain free from injury will improve Outcome: Progressing   Problem: Skin Integrity: Goal: Risk for impaired skin integrity will decrease Outcome: Progressing   Problem: Education: Goal: Ability to describe self-care measures that may prevent or decrease complications (Diabetes Survival Skills Education) will improve Outcome: Progressing Goal: Individualized Educational Video(s) Outcome: Progressing   Problem: Coping: Goal: Ability to adjust to condition or change in health will  improve Outcome: Progressing   Problem: Fluid Volume: Goal: Ability to maintain a balanced intake and output will improve Outcome: Progressing   Problem: Health Behavior/Discharge Planning: Goal: Ability to identify and utilize available resources and services will improve Outcome: Progressing Goal: Ability to manage health-related needs will improve Outcome: Progressing   Problem: Metabolic: Goal: Ability to maintain appropriate glucose levels will improve Outcome: Progressing   Problem: Nutritional: Goal: Maintenance of adequate nutrition will improve Outcome: Progressing Goal: Progress toward achieving an optimal weight will improve Outcome: Progressing   Problem: Skin Integrity: Goal: Risk for impaired skin integrity will decrease Outcome: Progressing   Problem: Tissue Perfusion: Goal: Adequacy of tissue perfusion will improve Outcome: Progressing

## 2024-11-18 NOTE — Progress Notes (Signed)
 Patient ID: [name], male   DOB: 1941-08-15, 84 y.o.   MRN: [id number] [name] Progress Note   Assessment/ Plan:   1. Acute kidney Injury: Likely secondary to obstruction/obstructive uropathy.  Nonoliguric with sluggish improvement of renal function following Foley catheter placement that raised concern for continued ureteral obstruction based on previous renal ultrasound; now status post percutaneous nephrostomy tube placement.  No acute electrolyte abnormality/indications for dialysis. 2.  Urinary tract infection: With progressive BPH/bladder outlet obstruction.  On empiric antibiotic coverage with ceftriaxone  with urine cultures negative to date.  Status post nephrostomy tube placement today. 3.  Anemia: Likely secondary to chronic illness.  Status post PRBC transfusion 11/13/2024 and without overt blood loss. 4.  Leukocytosis: Improving slowly on empiric ceftriaxone  therapy.  Urine and blood cultures negative to date.  Subjective:   Denies any acute issues overnight and tolerated nephrostomy tube placement without problems   Objective:   BP (!) 111/53   Pulse 72   Temp (!) 97.5 F (36.4 C) (Oral)   Resp (!) 23   Ht 5' 5 (1.651 m)   Wt 83.6 kg   SpO2 100%   BMI 30.67 kg/m   Intake/Output Summary (Last 24 hours) at 11/18/2024 1134 Last data filed at 11/18/2024 0500 Gross per 24 hour  Intake --  Output 925 ml  Net -925 ml   Weight change: -1.5 kg  Physical Exam: Gen: Resting comfortably in bed.  Son and daughter at bedside CVS: Pulse regular rhythm, normal rate, S1 and S2 normal Resp: Clear to auscultation, no rales/rhonchi Abd: Soft, flat, nontender, bowel sounds normal.  Nephrostomy tubes in situ Ext: No lower extremity edema  Imaging: No results found.   Labs: BMET Recent Labs  Lab 11/12/24 0500 11/12/24 1813 11/13/24 0432 11/13/24 1325 11/14/24 0447 11/14/24 2000 11/15/24 0416 11/15/24 1659 11/16/24 0753 11/17/24 0156 11/18/24 0302   NA 132*   < > 132*   < > 134* 134* 136 140 139 138 143  143  K 4.1   < > 3.6   < > 3.5 4.2 3.8 4.0 3.5 3.0* 3.2*  3.2*  CL 101   < > 96*   < > 97* 99 100 101 102 103 107  107  CO2 15*   < > 21*   < > 21* 21* 21* 16* 18* 20* 22  22  GLUCOSE 219*   < > 165*   < > 116* 95 86 84 87 154* 96  99  BUN 161*   < > 136*   < > 129* 122* 122* 118* 119* 121* 114*  115*  CREATININE 9.70*   < > 7.98*   < > 7.26* 7.11* 6.86* 6.74* 6.84* 6.68* 6.48*  6.52*  CALCIUM  7.5*   < > 7.2*   < > 7.6* 7.6* 7.6* 8.0* 8.2* 7.6* 7.9*  7.9*  PHOS 4.0  --  4.0  --  4.9*  --  5.4*  --  6.3* 5.1* 5.0*   < > = values in this interval not displayed.   CBC Recent Labs  Lab 11/11/24 1230 11/11/24 1232 11/15/24 1659 11/16/24 0753 11/17/24 0156 11/18/24 0302  WBC 20.7*   < > 20.0* 15.5* 11.8* 10.9*  NEUTROABS 18.9*  --   --   --   --   --   HGB 9.2*   < > 9.9* 9.5* 8.2* 8.3*  HCT 28.7*   < > 29.4* 29.2* 25.9* 26.5*  MCV 102.1*   < >  98.0 98.3 101.2* 101.5*  PLT 276   < > 200 214 195 214   < > = values in this interval not displayed.    Medications:     Chlorhexidine  Gluconate Cloth  6 each Topical Daily   feeding supplement  1 Container Oral BID BM   feeding supplement  237 mL Oral BID BM   Gerhardt's butt cream  1 Application Topical TID   heparin  injection (subcutaneous)  5,000 Units Subcutaneous Q8H   insulin  aspart  0-9 Units Subcutaneous TID WC   midodrine   10 mg Oral TID WC   mirtazapine   15 mg Oral QHS   multivitamin  1 tablet Oral QHS   pantoprazole   40 mg Oral BID   potassium chloride   30 mEq Oral Once   sodium chloride  flush  10-40 mL Intracatheter Q12H    Gordy Blanch, MD 11/18/2024, 11:34 AM

## 2024-11-19 DIAGNOSIS — N170 Acute kidney failure with tubular necrosis: Secondary | ICD-10-CM | POA: Diagnosis not present

## 2024-11-19 LAB — CBC
HCT: 24.7 % — ABNORMAL LOW (ref 39.0–52.0)
Hemoglobin: 7.9 g/dL — ABNORMAL LOW (ref 13.0–17.0)
MCH: 32.8 pg (ref 26.0–34.0)
MCHC: 32 g/dL (ref 30.0–36.0)
MCV: 102.5 fL — ABNORMAL HIGH (ref 80.0–100.0)
Platelets: 200 K/uL (ref 150–400)
RBC: 2.41 MIL/uL — ABNORMAL LOW (ref 4.22–5.81)
RDW: 16.9 % — ABNORMAL HIGH (ref 11.5–15.5)
WBC: 9.8 K/uL (ref 4.0–10.5)
nRBC: 0 % (ref 0.0–0.2)

## 2024-11-19 LAB — GLUCOSE, CAPILLARY
Glucose-Capillary: 110 mg/dL — ABNORMAL HIGH (ref 70–99)
Glucose-Capillary: 203 mg/dL — ABNORMAL HIGH (ref 70–99)
Glucose-Capillary: 155 mg/dL — ABNORMAL HIGH (ref 70–99)
Glucose-Capillary: 140 mg/dL — ABNORMAL HIGH (ref 70–99)

## 2024-11-19 LAB — RENAL FUNCTION PANEL
Albumin: 2.1 g/dL — ABNORMAL LOW (ref 3.5–5.0)
Anion gap: 13 (ref 5–15)
BUN: 109 mg/dL — ABNORMAL HIGH (ref 8–23)
CO2: 21 mmol/L — ABNORMAL LOW (ref 22–32)
Calcium: 7.6 mg/dL — ABNORMAL LOW (ref 8.9–10.3)
Chloride: 107 mmol/L (ref 98–111)
Creatinine, Ser: 6.22 mg/dL — ABNORMAL HIGH (ref 0.61–1.24)
GFR, Estimated: 8 mL/min — ABNORMAL LOW
Glucose, Bld: 93 mg/dL (ref 70–99)
Phosphorus: 4.4 mg/dL (ref 2.5–4.6)
Potassium: 3.5 mmol/L (ref 3.5–5.1)
Sodium: 141 mmol/L (ref 135–145)

## 2024-11-19 LAB — PREPARE RBC (CROSSMATCH)

## 2024-11-19 LAB — MAGNESIUM: Magnesium: 1.9 mg/dL (ref 1.7–2.4)

## 2024-11-19 MED ORDER — POTASSIUM CHLORIDE CRYS ER 20 MEQ PO TBCR: 40.0000 meq | EXTENDED_RELEASE_TABLET | ORAL | Status: DC

## 2024-11-19 MED ORDER — ALTEPLASE 2 MG IJ SOLR
2.0000 mg | Freq: Once | INTRAMUSCULAR | Status: AC
  Administered 2024-11-19: 2 mg
  Filled 2024-11-19: qty 2

## 2024-11-19 MED ORDER — SODIUM CHLORIDE 0.9% IV SOLUTION: Freq: Once | INTRAVENOUS | Status: DC

## 2024-11-19 MED ORDER — POTASSIUM CHLORIDE CRYS ER 20 MEQ PO TBCR
30.0000 meq | EXTENDED_RELEASE_TABLET | ORAL | Status: AC
  Administered 2024-11-19 (×2): 30 meq via ORAL
  Filled 2024-11-19 (×2): qty 1

## 2024-11-19 NOTE — Plan of Care (Signed)
 Progressing towards goals of care.     Problem: Education: Goal: Knowledge of General Education information will improve Description: Including pain rating scale, medication(s)/side effects and non-pharmacologic comfort measures Outcome: Progressing   Problem: Health Behavior/Discharge Planning: Goal: Ability to manage health-related needs will improve Outcome: Progressing   Problem: Clinical Measurements: Goal: Ability to maintain clinical measurements within normal limits will improve Outcome: Progressing Goal: Will remain free from infection Outcome: Progressing Goal: Diagnostic test results will improve Outcome: Progressing Goal: Respiratory complications will improve Outcome: Progressing Goal: Cardiovascular complication will be avoided Outcome: Progressing   Problem: Activity: Goal: Risk for activity intolerance will decrease Outcome: Progressing   Problem: Nutrition: Goal: Adequate nutrition will be maintained Outcome: Progressing   Problem: Coping: Goal: Level of anxiety will decrease Outcome: Progressing   Problem: Elimination: Goal: Will not experience complications related to bowel motility Outcome: Progressing Goal: Will not experience complications related to urinary retention Outcome: Progressing   Problem: Pain Managment: Goal: General experience of comfort will improve and/or be controlled Outcome: Progressing   Problem: Safety: Goal: Ability to remain free from injury will improve Outcome: Progressing   Problem: Skin Integrity: Goal: Risk for impaired skin integrity will decrease Outcome: Progressing   Problem: Education: Goal: Ability to describe self-care measures that may prevent or decrease complications (Diabetes Survival Skills Education) will improve Outcome: Progressing Goal: Individualized Educational Video(s) Outcome: Progressing   Problem: Coping: Goal: Ability to adjust to condition or change in health will improve Outcome:  Progressing   Problem: Fluid Volume: Goal: Ability to maintain a balanced intake and output will improve Outcome: Progressing   Problem: Health Behavior/Discharge Planning: Goal: Ability to identify and utilize available resources and services will improve Outcome: Progressing Goal: Ability to manage health-related needs will improve Outcome: Progressing   Problem: Metabolic: Goal: Ability to maintain appropriate glucose levels will improve Outcome: Progressing   Problem: Nutritional: Goal: Maintenance of adequate nutrition will improve Outcome: Progressing Goal: Progress toward achieving an optimal weight will improve Outcome: Progressing   Problem: Skin Integrity: Goal: Risk for impaired skin integrity will decrease Outcome: Progressing   Problem: Tissue Perfusion: Goal: Adequacy of tissue perfusion will improve Outcome: Progressing

## 2024-11-19 NOTE — Progress Notes (Signed)
 Physical Therapy Treatment Patient Details Name: [name] MRN: [id number] DOB: 22-Oct-1941 Today's Date: 11/19/2024   History of Present Illness 84 y.o. male admitted 11/11/24 with weakness, fatigue, decreased appetite, decreased urine output. Workup for severe AKI, septic shock secondary to UTI, severe bilateral hydronephrosis. Transfer out of ICU 1/18. Imaging 1/19 consistent with partial SBO. PMH includes HTN, HLD, DM, NSTEMI, ischemic cardiomyopathy, HF, CVA, heart murmur.    PT Comments  Pt received in supine and agreeable to session. Pt limited by fatigue, but participates well. Pt able to perform bed mobility and transfers with up to CGA for safety. Pt requires assist with line management throughout. Pt reports improved dizziness this session, but continues with low BP. Unable to progress gait training due to fatigue, but pt agreeable to remain sitting in recliner at end of session. Pt continues to benefit from PT services to progress toward functional mobility goals.    If plan is discharge home, recommend the following: A little help with walking and/or transfers;A little help with bathing/dressing/bathroom;Assistance with cooking/housework;Assist for transportation;Help with stairs or ramp for entrance   Can travel by private vehicle     Yes  Equipment Recommendations   (TBD - potential RW vs rollator)    Recommendations for Other Services       Precautions / Restrictions Precautions Precautions: Fall Recall of Precautions/Restrictions: Intact Precaution/Restrictions Comments: watch BP Restrictions Weight Bearing Restrictions Per Provider Order: No     Mobility  Bed Mobility Overal bed mobility: Needs Assistance Bed Mobility: Supine to Sit     Supine to sit: Supervision, HOB elevated, Used rails     General bed mobility comments: increased time and effort to get to EOB.    Transfers Overall transfer level: Needs assistance Equipment used: Rolling walker (2  wheels) Transfers: Sit to/from Stand, Bed to chair/wheelchair/BSC Sit to Stand: Contact guard assist   Step pivot transfers: Contact guard assist       General transfer comment: STS from EOB and recliner with CGA for safety and cues for hand placement. increased time to pivot with slow, steady steps    Ambulation/Gait               General Gait Details: Pt defers due to quick fatigue   Stairs             Wheelchair Mobility     Tilt Bed    Modified Rankin (Stroke Patients Only)       Balance Overall balance assessment: Needs assistance Sitting-balance support: No upper extremity supported, Feet supported Sitting balance-Leahy Scale: Good Sitting balance - Comments: EOB   Standing balance support: During functional activity, Bilateral upper extremity supported Standing balance-Leahy Scale: Fair Standing balance comment: with RW support                            Communication Communication Communication: No apparent difficulties  Cognition Arousal: Alert Behavior During Therapy: WFL for tasks assessed/performed   PT - Cognitive impairments: Problem solving                         Following commands: Intact Following commands impaired: Only follows one step commands consistently, Follows multi-step commands with increased time    Cueing Cueing Techniques: Verbal cues, Gestural cues  Exercises General Exercises - Lower Extremity Hip Flexion/Marching: AROM, Standing, Both, 10 reps    General Comments General comments (skin integrity, edema, etc.): BP sitting  EOB 81/44; BP in recliner at end of session 88/41      Pertinent Vitals/Pain Pain Assessment Pain Assessment: Faces Faces Pain Scale: No hurt     PT Goals (current goals can now be found in the care plan section) Acute Rehab PT Goals Patient Stated Goal: return home; children interested in SNF rehab PT Goal Formulation: With patient Time For Goal Achievement:  11/30/24 Progress towards PT goals: Progressing toward goals    Frequency    Min 3X/week       AM-PAC PT 6 Clicks Mobility   Outcome Measure  Help needed turning from your back to your side while in a flat bed without using bedrails?: A Little Help needed moving from lying on your back to sitting on the side of a flat bed without using bedrails?: A Little Help needed moving to and from a bed to a chair (including a wheelchair)?: A Little Help needed standing up from a chair using your arms (e.g., wheelchair or bedside chair)?: A Little Help needed to walk in hospital room?: Total Help needed climbing 3-5 steps with a railing? : Total 6 Click Score: 14    End of Session Equipment Utilized During Treatment: Gait belt Activity Tolerance: Patient tolerated treatment well;Patient limited by fatigue Patient left: in chair;with family/visitor present;with call bell/phone within reach Nurse Communication: Mobility status PT Visit Diagnosis: Other abnormalities of gait and mobility (R26.89);Muscle weakness (generalized) (M62.81)     Time: 8971-8941 PT Time Calculation (min) (ACUTE ONLY): 30 min  Charges:    $Therapeutic Activity: 23-37 mins PT General Charges $$ ACUTE PT VISIT: 1 Visit                    Darryle George, PTA Acute Rehabilitation Services Secure Chat Preferred  [phone]    Darryle George 11/19/2024, 1:00 PM

## 2024-11-19 NOTE — TOC Initial Note (Signed)
 Transition of Care [facility]) - Initial/Assessment Note    Patient Details  Name: [name] MRN: [id number] Date of Birth: 02/28/1941  [facility] CM/SW Contact:    Sharyne Drum, Student-Social Work Phone Number: 11/19/2024, 11:25 AM  Clinical Narrative:                  Pt presented to ED with weakness and no appetite. Pt lives with adult son and dtr is involved in care as well. PCP is listed in chart. PT/OT rec is short term rehab at [facility].  MSW Student met with pt on 11/18/2024 for consent to go to SNF and start workup. Pt was agreeable. MSW Student completed and sent out FL2. MSW met with pt td to present SNF choices for pt and family. Pt and family to follow up with choice. Inpatient care management will continue to follow for insurance auth and bed availability.   Expected Discharge Plan: Skilled Nursing Facility Barriers to Discharge: Continued Medical Work up, English As A Second Language Teacher   Patient Goals and CMS Choice            Expected Discharge Plan and Services In-house Referral: Clinical Social Work     Living arrangements for the past 2 months: Single Family Home                                      Prior Living Arrangements/Services Living arrangements for the past 2 months: Single Family Home Lives with:: Adult Children (Lives with son) Patient language and need for interpreter reviewed:: Yes Do you feel safe going back to the place where you live?: Yes      Need for Family Participation in Patient Care: Yes (Comment) Care giver support system in place?: Yes (comment)   Criminal Activity/Legal Involvement Pertinent to Current Situation/Hospitalization: No - Comment as needed  Activities of Daily Living      Permission Sought/Granted Permission sought to share information with : Facility Medical Sales Representative, Family Supports    Share Information with NAME: Yowell-Hipps,Theresa- Daughter [phone]  [phone] or  Tillmon, Kisling   [phone]           Emotional Assessment Appearance:: Appears stated age     Orientation: : Oriented to Self, Oriented to Place, Oriented to  Time, Oriented to Situation Alcohol / Substance Use: Not Applicable Psych Involvement: No (comment)  Admission diagnosis:  Renal failure [N19] Kidney failure, acute [N17.9] Patient Active Problem List   Diagnosis Date Noted   Malnutrition of moderate degree 11/15/2024   Hypotension 11/13/2024   Metabolic acidosis 11/13/2024   Sepsis (HCC) 11/13/2024   Urinary retention 11/13/2024   Urinary tract infection with hematuria 11/13/2024   Pressure injury of skin 11/13/2024   Renal failure 11/11/2024   Kidney failure, acute 11/11/2024   Chronic combined systolic and diastolic heart failure (HCC) 10/26/2016   Obese 05/02/2016   Type 2 diabetes mellitus with obesity 04/23/2014   Hypertension    Hyperlipidemia    Myocardial infarction [facility])    Coronary artery disease    CAD S/P LAD PCI 2008 02/25/2012   PCP:  Faythe Purchase, MD Pharmacy:   [facility] [place], [name] - [address] [facility] [address] [name] [address] Phone: [phone] Fax: [phone]     Social Drivers of Health (SDOH) Social History: SDOH Screenings   Food Insecurity: No Food Insecurity (11/11/2024)  Housing: Low Risk (11/11/2024)  Transportation Needs: No Transportation Needs (11/11/2024)  Utilities: Not At Risk (11/11/2024)  Social Connections: Moderately Integrated (11/11/2024)  Tobacco Use: Medium Risk (11/11/2024)   SDOH Interventions:     Readmission Risk Interventions     No data to display

## 2024-11-19 NOTE — TOC Progression Note (Signed)
 Transition of Care [facility]) - Progression Note    Patient Details  Name: [name] MRN: [id number] Date of Birth: 05-05-1941  Transition of Care [facility]) CM/SW Contact  Sharyne Drum, Student-Social Work Phone Number: 11/19/2024, 2:29 PM  Clinical Narrative:     MSW followed up with pt and family about SNF options. While in room pt's son expressed interest in getting the pt into CIR. MSW Student notified CSW to place consult with CIR as discharge option. ICM continuing to follow.   Expected Discharge Plan: Skilled Nursing Facility Barriers to Discharge: Continued Medical Work up, English As A Second Language Teacher               Expected Discharge Plan and Services In-house Referral: Clinical Social Work     Living arrangements for the past 2 months: Single Family Home                                       Social Drivers of Health (SDOH) Interventions SDOH Screenings   Food Insecurity: No Food Insecurity (11/11/2024)  Housing: Low Risk (11/11/2024)  Transportation Needs: No Transportation Needs (11/11/2024)  Utilities: Not At Risk (11/11/2024)  Social Connections: Moderately Integrated (11/11/2024)  Tobacco Use: Medium Risk (11/11/2024)    Readmission Risk Interventions     No data to display

## 2024-11-19 NOTE — Progress Notes (Signed)
 "    Subjective: [name]. Reviewed case and plan with patient and both children. Pt resting comfortably in bed.   Objective: Vital signs in last 24 hours: Temp:  [97.6 F (36.4 C)-98.9 F (37.2 C)] 97.6 F (36.4 C) (01/22 0800) Pulse Rate:  [64-67] 64 (01/22 0800) Resp:  [11-17] 15 (01/22 0800) BP: (91-104)/(43-50) 92/43 (01/22 0800) SpO2:  [87 %-100 %] 100 % (01/22 0800) Weight:  [85.1 kg] 85.1 kg (01/22 0500)  Assessment/Plan: # Gross hematuria # Anemia # ARF # BPH  Bilateral percutaneous nephrostomy tubes placed in IR 11/18/2024.  Clear yellow urine draining from the right.  Bloody drainage from the left.  No output on examination, stripped tubing with bloody drainage afterwards  Interval improvement in serum creatinine.  Continue trending labs.  Reviewed case and plan with patient and son at the bedside.  They understand there is a waiting game at this point and with his baseline CKD 3B, recovery will happen slowly.  Cloudy sediment and small amounts of urine in Foley catheter today.  Once all output is moving through the percutaneous nephrostomy tubes.  We will remove the Foley.  Urology will follow  Intake/Output from previous day: 01/21 0701 - 01/22 0700 In: 35 [P.O.:25; I.V.:10] Out: 885 [Urine:885]  Intake/Output this shift: No intake/output data recorded.  Physical Exam:  General: Alert and oriented CV: No cyanosis Lungs: equal chest rise Gu: foley catheter in with clear yellow urine, significant cloudy sediment.  Lab Results: Recent Labs    11/17/24 0156 11/18/24 0302 11/19/24 0339  HGB 8.2* 8.3* 7.9*  HCT 25.9* 26.5* 24.7*   BMET Recent Labs    11/18/24 0302 11/19/24 0339  NA 143  143 141  K 3.2*  3.2* 3.5  CL 107  107 107  CO2 22  22 21*  GLUCOSE 96  99 93  BUN 114*  115* 109*  CREATININE 6.48*  6.52* 6.22*  CALCIUM  7.9*  7.9* 7.6*  HGB 8.3* 7.9*  WBC 10.9* 9.8     Studies/Results: IR NEPHROSTOMY PLACEMENT BILATERAL Result  Date: 11/18/2024 INDICATION: 84 year old with bilateral hydronephrosis, BPH/bladder outlet obstruction and acute kidney injury. EXAM: PLACEMENT OF BILATERAL NEPHROSTOMY TUBES WITH ULTRASOUND AND FLUOROSCOPIC GUIDANCE Physician: Juliene SAUNDERS. Philip, MD COMPARISON:  Renal ultrasound 11/14/2024 MEDICATIONS: Versed  0.5 mg ANESTHESIA/SEDATION: Patient's level of consciousness and vital signs were monitored continuously by radiology nurse throughout the procedure under the supervision of the provider performing the procedure. CONTRAST:  15 mL OMNIPAQUE  IOHEXOL  300 MG/ML SOLN - administered into the collecting system(s) FLUOROSCOPY: Radiation Exposure Index (as provided by the fluoroscopic device): 6 mGy Kerma COMPLICATIONS: None immediate. PROCEDURE: The procedure was explained to the patient. The risks and benefits of the procedure were discussed and the patient's questions were addressed. Informed consent was obtained from the patient. Patient was placed prone. Both flanks were prepped and draped in sterile fashion. Maximal barrier sterile technique was utilized including caps, mask, sterile gowns, sterile gloves, sterile drape, hand hygiene and skin antiseptic. Ultrasound identified the left kidney. Skin was anesthetized with 1% lidocaine . Small incision was made. Using ultrasound guidance, a 21 gauge needle was directed into a dilated calyx in the midpole region. Contrast injection confirmed placement in the collecting system. 0.018 wire was advanced in the collecting system. Accustick dilator set was placed. Tract was dilated over a superstiff Amplatz wire and a 10 French multipurpose drain was reconstituted in the renal pelvis. Urine was aspirated. Contrast injection confirmed placement in the renal pelvis. Drain was sutured  to the skin. Drain was attached to a gravity bag. Ultrasound identified the right kidney. Skin was anesthetized with 1% lidocaine . Small incision was made. Using ultrasound guidance, a 21 gauge needle  was directed into a dilated calyx in the midpole region. Contrast injection confirmed placement in the collecting system. 0.018 wire was advanced in the collecting system. Accustick dilator set was placed. Tract was dilated over a superstiff Amplatz wire and a 10 French multipurpose drain was reconstituted in the renal pelvis. Urine was aspirated. Contrast injection confirmed placement in the renal pelvis. Drain was sutured to the skin. Drain was attached to a gravity bag. Fluoroscopic and ultrasound images were taken and saved for documentation. FINDINGS: Ultrasound demonstrated moderate bilateral hydronephrosis. Left nephrostomy tube is reconstituted in left renal pelvis. Right nephrostomy tube is reconstituted in the right renal pelvis. IMPRESSION: Successful placement of bilateral percutaneous nephrostomy tubes using ultrasound and fluoroscopic guidance. Electronically Signed   By: Juliene Balder M.D.   On: 11/18/2024 16:49   DG Abd Portable 1V Result Date: 11/18/2024 CLINICAL DATA:  Small bowel obstruction. EXAM: PORTABLE ABDOMEN - 1 VIEW COMPARISON:  Radiograph 11/16/2024, CT 11/11/2024 FINDINGS: Bilateral nephrostomy tubes in place. Gaseous distension of large and small bowel. No significant formed colonic stool. No evidence of free air in the supine views. IMPRESSION: Gaseous distension of large and small bowel, favor ileus. Electronically Signed   By: Andrea Gasman M.D.   On: 11/18/2024 13:42      LOS: 8 days   Ole Bourdon, NP [facility] Pager: [phone]  11/19/2024, 10:56 AM  "

## 2024-11-19 NOTE — Progress Notes (Signed)
 Patient ID: [name], male   DOB: 06-May-1941, 84 y.o.   MRN: [id number] [facility] Progress Note   Assessment/ Plan:   1. Acute kidney Injury: Likely secondary to obstruction/obstructive uropathy.  Nonoliguric overnight with slight improvement of BUN/creatinine following placement of percutaneous nephrostomy tubes yesterday.  No acute electrolyte abnormality/indications for dialysis.  Continues to have sluggish renal recovery with no clarity as to how much recovery he will have (based on unknown duration of how long he may have had obstruction). 2.  Urinary tract infection: With progressive BPH/bladder outlet obstruction status post Foley catheter placement.  On empiric antibiotic coverage with ceftriaxone  with urine cultures negative to date.  Appreciate urology input. 3.  Anemia: Likely secondary to chronic illness.  Status post PRBC transfusion 11/13/2024 and without overt blood loss. 4.  Leukocytosis: Improving slowly on empiric ceftriaxone  therapy.  Urine and blood cultures negative to date.  Subjective:   Without acute events overnight.  PT has recommended placement to skilled nursing facility   Objective:   BP (!) 92/43   Pulse 64   Temp 97.6 F (36.4 C) (Oral)   Resp 15   Ht 5' 5 (1.651 m)   Wt 85.1 kg   SpO2 100%   BMI 31.22 kg/m   Intake/Output Summary (Last 24 hours) at 11/19/2024 1138 Last data filed at 11/19/2024 0400 Gross per 24 hour  Intake 10 ml  Output 885 ml  Net -875 ml   Weight change: 1.5 kg  Physical Exam: Gen: Resting comfortably in bed.  Son and daughter at bedside CVS: Pulse regular rhythm, normal rate, S1 and S2 normal Resp: Clear to auscultation, no rales/rhonchi Abd: Soft, flat, nontender, bowel sounds normal.  Nephrostomy tubes in situ Ext: No lower extremity edema  Imaging: IR NEPHROSTOMY PLACEMENT BILATERAL Result Date: 11/18/2024 INDICATION: 84 year old with bilateral hydronephrosis, BPH/bladder outlet obstruction and acute  kidney injury. EXAM: PLACEMENT OF BILATERAL NEPHROSTOMY TUBES WITH ULTRASOUND AND FLUOROSCOPIC GUIDANCE Physician: Juliene SAUNDERS. Philip, MD COMPARISON:  Renal ultrasound 11/14/2024 MEDICATIONS: Versed  0.5 mg ANESTHESIA/SEDATION: Patient's level of consciousness and vital signs were monitored continuously by radiology nurse throughout the procedure under the supervision of the provider performing the procedure. CONTRAST:  15 mL OMNIPAQUE  IOHEXOL  300 MG/ML SOLN - administered into the collecting system(s) FLUOROSCOPY: Radiation Exposure Index (as provided by the fluoroscopic device): 6 mGy Kerma COMPLICATIONS: None immediate. PROCEDURE: The procedure was explained to the patient. The risks and benefits of the procedure were discussed and the patient's questions were addressed. Informed consent was obtained from the patient. Patient was placed prone. Both flanks were prepped and draped in sterile fashion. Maximal barrier sterile technique was utilized including caps, mask, sterile gowns, sterile gloves, sterile drape, hand hygiene and skin antiseptic. Ultrasound identified the left kidney. Skin was anesthetized with 1% lidocaine . Small incision was made. Using ultrasound guidance, a 21 gauge needle was directed into a dilated calyx in the midpole region. Contrast injection confirmed placement in the collecting system. 0.018 wire was advanced in the collecting system. Accustick dilator set was placed. Tract was dilated over a superstiff Amplatz wire and a 10 French multipurpose drain was reconstituted in the renal pelvis. Urine was aspirated. Contrast injection confirmed placement in the renal pelvis. Drain was sutured to the skin. Drain was attached to a gravity bag. Ultrasound identified the right kidney. Skin was anesthetized with 1% lidocaine . Small incision was made. Using ultrasound guidance, a 21 gauge needle was directed into a dilated calyx in the midpole region. Contrast  injection confirmed placement in the collecting  system. 0.018 wire was advanced in the collecting system. Accustick dilator set was placed. Tract was dilated over a superstiff Amplatz wire and a 10 French multipurpose drain was reconstituted in the renal pelvis. Urine was aspirated. Contrast injection confirmed placement in the renal pelvis. Drain was sutured to the skin. Drain was attached to a gravity bag. Fluoroscopic and ultrasound images were taken and saved for documentation. FINDINGS: Ultrasound demonstrated moderate bilateral hydronephrosis. Left nephrostomy tube is reconstituted in left renal pelvis. Right nephrostomy tube is reconstituted in the right renal pelvis. IMPRESSION: Successful placement of bilateral percutaneous nephrostomy tubes using ultrasound and fluoroscopic guidance. Electronically Signed   By: Juliene Balder M.D.   On: 11/18/2024 16:49   DG Abd Portable 1V Result Date: 11/18/2024 CLINICAL DATA:  Small bowel obstruction. EXAM: PORTABLE ABDOMEN - 1 VIEW COMPARISON:  Radiograph 11/16/2024, CT 11/11/2024 FINDINGS: Bilateral nephrostomy tubes in place. Gaseous distension of large and small bowel. No significant formed colonic stool. No evidence of free air in the supine views. IMPRESSION: Gaseous distension of large and small bowel, favor ileus. Electronically Signed   By: Andrea Gasman M.D.   On: 11/18/2024 13:42     Labs: BMET Recent Labs  Lab 11/13/24 0432 11/13/24 1325 11/14/24 0447 11/14/24 2000 11/15/24 0416 11/15/24 1659 11/16/24 0753 11/17/24 0156 11/18/24 0302 11/19/24 0339  NA 132*   < > 134* 134* 136 140 139 138 143  143 141  K 3.6   < > 3.5 4.2 3.8 4.0 3.5 3.0* 3.2*  3.2* 3.5  CL 96*   < > 97* 99 100 101 102 103 107  107 107  CO2 21*   < > 21* 21* 21* 16* 18* 20* 22  22 21*  GLUCOSE 165*   < > 116* 95 86 84 87 154* 96  99 93  BUN 136*   < > 129* 122* 122* 118* 119* 121* 114*  115* 109*  CREATININE 7.98*   < > 7.26* 7.11* 6.86* 6.74* 6.84* 6.68* 6.48*  6.52* 6.22*  CALCIUM  7.2*   < > 7.6* 7.6*  7.6* 8.0* 8.2* 7.6* 7.9*  7.9* 7.6*  PHOS 4.0  --  4.9*  --  5.4*  --  6.3* 5.1* 5.0* 4.4   < > = values in this interval not displayed.   CBC Recent Labs  Lab 11/16/24 0753 11/17/24 0156 11/18/24 0302 11/19/24 0339  WBC 15.5* 11.8* 10.9* 9.8  HGB 9.5* 8.2* 8.3* 7.9*  HCT 29.2* 25.9* 26.5* 24.7*  MCV 98.3 101.2* 101.5* 102.5*  PLT 214 195 214 200    Medications:     aspirin  EC  81 mg Oral QHS   Chlorhexidine  Gluconate Cloth  6 each Topical Daily   feeding supplement  1 Container Oral BID BM   feeding supplement  237 mL Oral BID BM   Gerhardt's butt cream  1 Application Topical TID   heparin  injection (subcutaneous)  5,000 Units Subcutaneous Q8H   insulin  aspart  0-9 Units Subcutaneous TID WC   midodrine   10 mg Oral TID WC   mirtazapine   15 mg Oral QHS   multivitamin  1 tablet Oral QHS   pantoprazole   40 mg Oral BID   potassium chloride   30 mEq Oral Q2H   saccharomyces boulardii  250 mg Oral BID   simethicone   80 mg Oral QID   sodium chloride  flush  10-40 mL Intracatheter Q12H    Gordy Blanch, MD 11/19/2024, 11:38 AM

## 2024-11-19 NOTE — TOC Progression Note (Cosign Needed)
 Transition of Care [facility]) - Progression Note    Patient Details  Name: DESJUAN STEARNS MRN: [id number] Date of Birth: 1941-01-14  Transition of [facility]) CM/SW Contact  Sharyne Drum, Student-Social Work Phone Number: 11/19/2024, 9:06 AM  Clinical Narrative:     MSW Student met with pt and son and daughter at bedside to discuss the PT/OT rec of short term rehab at [facility]. Pt and family were agreeable to go and were informed of next steps of process. MSW Student completed FL2 and sent out. ICM will continue to follow for auth and options of facilities.   Expected Discharge Plan: Skilled Nursing Facility Barriers to Discharge: Continued Medical Work up, English As A Second Language Teacher               Expected Discharge Plan and Services In-house Referral: Clinical Social Work     Living arrangements for the past 2 months: Single Family Home                                       Social Drivers of Health (SDOH) Interventions SDOH Screenings   Food Insecurity: No Food Insecurity (11/11/2024)  Housing: Low Risk (11/11/2024)  Transportation Needs: No Transportation Needs (11/11/2024)  Utilities: Not At Risk (11/11/2024)  Social Connections: Moderately Integrated (11/11/2024)  Tobacco Use: Medium Risk (11/11/2024)    Readmission Risk Interventions     No data to display

## 2024-11-19 NOTE — Plan of Care (Signed)

## 2024-11-19 NOTE — Progress Notes (Signed)
 [facility] Progress Note Patient: [name] FMW:[id number] DOB: May 12, 1941  DOA: 11/11/2024 DOS: the patient was seen and examined on 11/19/2024  Brief Summary: Patient is a 84 y.o.  male with history of CAD s/p PCI 2008, HFmrEF, HTN, HLD-presented with generalized weakness/fatigue-loss of appetite-found to be hypotensive with AKI-thought to have obstructive uropathy.  Initially admitted to the ICU for pressors-stabilized and subsequently transferred to [facility].   Significant events: 1/14>> admit to ICU 1/16>> received 1 unit PRBC. 1/19>> transferred to [facility]. 1/21>> bilateral PCN performed. 1/22>> 1 unit PRBC ordered.   Significant studies: 1/14>> CXR: No PNA 1/14>> CT head: No acute intracranial process 1/14>> CT abdomen/pelvis: Severe bilateral hydronephrosis 1/14>> renal ultrasound: Severe bilateral hydronephrosis. 1/15>> echo: EF 55-60% 1/17>> renal ultrasound: Severe bilateral hydronephrosis 1/19>> x-ray KUB: Dilated loops of small bowel-at least consistent with a partial SBO.   Significant microbiology data: 1/14>> COVID/influenza/RSV PCR: Negative 1/14>> blood culture: No growth 1/15>> urine culture: No growth   Procedures: None   Consults: PCCM Nephrology Neurology  Assessment and plan: AKI Secondary to obstructive uropathy Although renal function improved-it seems to have plateaued over the past several days Foley catheter in place With nephrology/urology-IR consulted for percutaneous nephrostomy-as patient continues to have persistent hydronephrosis bilaterally with no improvement in his renal function Anticipate very slow progression of his renal function for now.   Hyperkalemia Hypokalemia Metabolic acidosis Secondary to AKI and poor p.o. intake. Monitor daily electrolyte. Maintain K more than 4.  Mag more than 2.   Multifactorial shock Secondary to septic shock due to UTI and hypovolemic shock from poor oral intake BP has stabilized with supportive  care Briefly required pressors on initial presentation Blood pressure remains soft. 1 PRBC ordered. Still on midodrine .   Complicated UTI Treated with Rocephin -although urine cultures negative-this was done after patient was given antibiotics   Ileus Patient with significant distention of his bowels. No definitive obstruction seen. Actually has a bowel movement and passing gas. Aggressively treating ileus with correction of electrolytes, MiraLAX . May initiate Reglan if continues to have issues.   GERD Dyspeptic symptoms are much better Continue PPI As needed simethicone .   Normocytic anemia Likely secondary to critical illness-has some hematuria after PCN placement. Hemoglobin dropped down to 7.9. Patient remains with hypotension despite being on midodrine . 1 unit of PRBC ordered.  Holding aspirin  and heparin  for DVT prophylaxis. Follow CBC   History of CAD s/p PCI 2008 No anginal symptoms   Chronic HFmrEF Euvolemic Diuretics on hold   HTN Presented with hypotension-all antihypertensives on hold-resume when able Currently actually on midodrine  still.   DM-2 (A1c 6.4 on 1/15) CBG stable-SSI   Anorexia/failure to thrive syndrome Poor oral intake for the past several months Patient not keen on getting NG tube placed Thankfully slowly improving-continue to encourage oral intake-continue supplements Remains on Remeron  Palliative care following.   Moderate Malnutrition Etiology: acute illness Signs/Symptoms: mild fat depletion, mild muscle depletion Interventions: Refer to RD note for recommendations   Pressure Ulcer: Agree with assessment as outlined below   Obesity Class 1 Body mass index is 31.22 kg/m.  Placing the pt at higher risk of poor outcomes.   Stage II coccyx ulcer, upper back pressure ulcers. Present on admission. Monitor.   Code Status: Full Code   DVT Prophylaxis: SCDs Start: 11/11/24 1516  Data review I have Reviewed nursing notes,  Vitals, and Lab results. Since last encounter, pertinent lab results CBC and BMP   . I have ordered test including CBC  and BMP  . I have discussed pt's care plan and test results with nephrology  .   Physical exam. Vitals:   11/19/24 0500 11/19/24 0800 11/19/24 1155 11/19/24 1600  BP:  (!) 92/43 (!) 83/60 (!) 91/38  Pulse:  64 79 69  Resp:  15 20 14   Temp:  97.6 F (36.4 C) 97.6 F (36.4 C) 98 F (36.7 C)  TempSrc:  Oral Oral Oral  SpO2:  100% 100% 97%  Weight: 85.1 kg     Height:        General: in Mild distress, No Rash Cardiovascular: S1 and S2 Present, No Murmur Respiratory: Good respiratory effort, Bilateral Air entry present. No Crackles, No wheezes Abdomen: Bowel Sound present, No tenderness Extremities: No edema Neuro: Alert and oriented x3, no new focal deficit   Subjective: No nausea or vomiting.  Oral intake improving.  Some blood in the urine.  Family Communication: Family at bedside.  Disposition Plan: Status is: Inpatient Remains inpatient appropriate because: Monitor for improvement in blood pressure.   Planned Discharge Destination:Skilled nursing facility Diet: Diet Order             Diet regular Room service appropriate? Yes; Fluid consistency: Thin  Diet effective now                   MEDICATIONS: Scheduled Meds:  sodium chloride    Intravenous Once   Chlorhexidine  Gluconate Cloth  6 each Topical Daily   Gerhardt's butt cream  1 Application Topical TID   insulin  aspart  0-9 Units Subcutaneous TID WC   midodrine   10 mg Oral TID WC   mirtazapine   15 mg Oral QHS   multivitamin  1 tablet Oral QHS   pantoprazole   40 mg Oral BID   saccharomyces boulardii  250 mg Oral BID   simethicone   80 mg Oral QID   sodium chloride  flush  10-40 mL Intracatheter Q12H   Continuous Infusions: PRN Meds:.acetaminophen , ondansetron  (ZOFRAN ) IV, polyethylene glycol, sodium chloride  flush  Author: Yetta Blanch, MD  [facility] 11/19/2024  7:32  PM Between 7PM-7AM, please contact night-coverage, check [email or web address] for on call.

## 2024-11-19 NOTE — Progress Notes (Signed)
" ° °  Inpatient Rehab Admissions Coordinator :  Per family request, was screened for CIR candidacy by Heron Leavell RN MSN. Patient is not at a level to tolerate the intensity required to pursue a CIR admit. Recommend other rehab venues to be pursued. Please contact me with any questions.  Heron Leavell RN MSN Admissions Coordinator [phone]  "

## 2024-11-19 NOTE — Progress Notes (Signed)
 Nutrition Follow-up  DOCUMENTATION CODES:   Non-severe (moderate) malnutrition in context of acute illness/injury  INTERVENTION:   -D/c Mighty Shake -D/c Magic Cup -D/c Boost Breeze -D/c Ensure Plus High Protein po BID, each supplement provides 350 kcal and 20 grams of protein  -Liberalize diet to regular for widest variety of meal selections  NUTRITION DIAGNOSIS:   Moderate Malnutrition related to acute illness as evidenced by mild fat depletion, mild muscle depletion.  Ongoing  GOAL:   Patient will meet greater than or equal to 90% of their needs  Progressing   MONITOR:   PO intake, Labs, Weight trends, I & O's  REASON FOR ASSESSMENT:   Consult Assessment of nutrition requirement/status  ASSESSMENT:   84 y.o. male presented to the ED with weakness, fatigue, and decreased appetite/PO. PMH includes CVA, CHF, HLD, HTN, DM, and CAD. Pt admitted with severe AKI on CKD 4, shock, and UTI.  1/19 KUB showed dilated loops of small bowel, consistent with partial SBO. Medical team monitoring; currently no clinical signs of obstruction.  1/21- s/p Placement of bilateral nephrostomy tubes   Reviewed I/O's: -850 ml x 24 hours and +7 L since admission  UOP: 885 ml x 24 hours  Attempted to speak withy patient x 3, however, in with other disciplines at times of attempted visits.   Case discussed with RN, who reports appetite is still poor. He is refusing all supplements. RN recommending appetite stimulant; noted remeron  added on 11/16/24.  Reviewed meal completions; intake has improved and patient is now consuming more than both. Reviewed meal; orders and over the past 24 hours, ordered meal include: grits and apple; chicken noodle soup, fruit, and broccoli; and omelette, English muffin, and sausage patty. Per meal completion records, patient consumed 75% of breakfast (559 kcals and 20 grams protein). Per MD, patient is slowly improving and is not keen on getting NG tube placed.    Reviewed weight history; weights have ranged from 75.5-85.1 kg. Patient with mild edema per nursing documentation. Patient +7 L since admission.  Per TOC notes, plan for SNF placement at discharge.   Medications reviewed and include midodrine , protonix , florastor, simethicone , and remeron   Labs reviewed: CBGS: 110-203 (inpatient orders for glycemic control are 0-9 units insulin  aspart TID with meals).    Diet Order:   Diet Order             Diet 2 gram sodium Room service appropriate? Yes; Fluid consistency: Thin  Diet effective now                   EDUCATION NEEDS:   Education needs have been addressed  Skin:  Skin Assessment: Reviewed RN Assessment (DTI: head, back; Stage 2: Coccyx)  Last BM:  1/19  Height:   Ht Readings from Last 1 Encounters:  11/11/24 5' 5 (1.651 m)    Weight:   Wt Readings from Last 1 Encounters:  11/19/24 85.1 kg    Ideal Body Weight:  61.8 kg  BMI:  Body mass index is 31.22 kg/m.  Estimated Nutritional Needs:   Kcal:  1650-1850  Protein:  80-100 grams  Fluid:  >/= 1.5 L    Margery ORN, RD, LDN, CDCES Registered Dietitian III Certified Diabetes Care and Education Specialist If unable to reach this RD, please use RD Inpatient group chat on secure chat between hours of 8am-4 pm daily

## 2024-11-20 ENCOUNTER — Inpatient Hospital Stay (HOSPITAL_COMMUNITY)

## 2024-11-20 DIAGNOSIS — N170 Acute kidney failure with tubular necrosis: Secondary | ICD-10-CM | POA: Diagnosis not present

## 2024-11-20 LAB — CBC
HCT: 26.1 % — ABNORMAL LOW (ref 39.0–52.0)
HCT: 27.8 % — ABNORMAL LOW (ref 39.0–52.0)
Hemoglobin: 8.4 g/dL — ABNORMAL LOW (ref 13.0–17.0)
Hemoglobin: 9 g/dL — ABNORMAL LOW (ref 13.0–17.0)
MCH: 31.3 pg (ref 26.0–34.0)
MCH: 31.5 pg (ref 26.0–34.0)
MCHC: 32.2 g/dL (ref 30.0–36.0)
MCHC: 32.4 g/dL (ref 30.0–36.0)
MCV: 97.4 fL (ref 80.0–100.0)
MCV: 97.2 fL (ref 80.0–100.0)
Platelets: 190 K/uL (ref 150–400)
Platelets: 211 K/uL (ref 150–400)
RBC: 2.68 MIL/uL — ABNORMAL LOW (ref 4.22–5.81)
RBC: 2.86 MIL/uL — ABNORMAL LOW (ref 4.22–5.81)
RDW: 18.7 % — ABNORMAL HIGH (ref 11.5–15.5)
RDW: 19.1 % — ABNORMAL HIGH (ref 11.5–15.5)
WBC: 8.9 K/uL (ref 4.0–10.5)
WBC: 8 K/uL (ref 4.0–10.5)
nRBC: 0 % (ref 0.0–0.2)
nRBC: 0 % (ref 0.0–0.2)

## 2024-11-20 LAB — GLUCOSE, CAPILLARY
Glucose-Capillary: 106 mg/dL — ABNORMAL HIGH (ref 70–99)
Glucose-Capillary: 170 mg/dL — ABNORMAL HIGH (ref 70–99)
Glucose-Capillary: 142 mg/dL — ABNORMAL HIGH (ref 70–99)
Glucose-Capillary: 118 mg/dL — ABNORMAL HIGH (ref 70–99)

## 2024-11-20 LAB — RENAL FUNCTION PANEL
Albumin: 2.1 g/dL — ABNORMAL LOW (ref 3.5–5.0)
Anion gap: 10 (ref 5–15)
BUN: 98 mg/dL — ABNORMAL HIGH (ref 8–23)
CO2: 21 mmol/L — ABNORMAL LOW (ref 22–32)
Calcium: 7.5 mg/dL — ABNORMAL LOW (ref 8.9–10.3)
Chloride: 109 mmol/L (ref 98–111)
Creatinine, Ser: 5.97 mg/dL — ABNORMAL HIGH (ref 0.61–1.24)
GFR, Estimated: 9 mL/min — ABNORMAL LOW
Glucose, Bld: 105 mg/dL — ABNORMAL HIGH (ref 70–99)
Phosphorus: 4.1 mg/dL (ref 2.5–4.6)
Potassium: 3.6 mmol/L (ref 3.5–5.1)
Sodium: 140 mmol/L (ref 135–145)

## 2024-11-20 LAB — MAGNESIUM: Magnesium: 1.7 mg/dL (ref 1.7–2.4)

## 2024-11-20 MED ORDER — SODIUM CHLORIDE 0.9% FLUSH
5.0000 mL | Freq: Two times a day (BID) | INTRAVENOUS | Status: DC
  Administered 2024-11-20 – 2024-12-02 (×23): 5 mL

## 2024-11-20 MED ORDER — POTASSIUM CHLORIDE CRYS ER 20 MEQ PO TBCR
40.0000 meq | EXTENDED_RELEASE_TABLET | ORAL | Status: AC
  Administered 2024-11-20 (×2): 40 meq via ORAL
  Filled 2024-11-20 (×2): qty 2

## 2024-11-20 MED ORDER — MAGNESIUM SULFATE 2 GM/50ML IV SOLN
2.0000 g | Freq: Once | INTRAVENOUS | Status: AC
  Administered 2024-11-20: 2 g via INTRAVENOUS
  Filled 2024-11-20: qty 50

## 2024-11-20 MED ORDER — ALBUMIN HUMAN 5 % IV SOLN
12.5000 g | Freq: Once | INTRAVENOUS | Status: AC
  Administered 2024-11-20: 12.5 g via INTRAVENOUS
  Filled 2024-11-20: qty 250

## 2024-11-20 NOTE — Hospital Course (Signed)
 Patient is a 84 y.o.  male with history of CAD s/p PCI 2008, HFmrEF, HTN, HLD-presented with generalized weakness/fatigue-loss of appetite-found to be hypotensive with AKI-thought to have obstructive uropathy.  Initially admitted to the ICU for pressors-stabilized and subsequently transferred to [facility].   Significant events: 1/14>> admit to ICU 1/16>> received 1 unit PRBC. 1/19>> transferred to [facility]. 1/21>> bilateral PCN performed. 1/22>> 1 unit PRBC given. 1/24>> 1 more PRBC given 1/25>> US  renal mild bilateral hydronephrosis mild pleural effusion.  Significant studies: 1/14>> CXR: No PNA 1/14>> CT head: No acute intracranial process 1/14>> CT abdomen/pelvis: Severe bilateral hydronephrosis 1/14>> renal ultrasound: Severe bilateral hydronephrosis. 1/15>> echo: EF 55-60% 1/17>> renal ultrasound: Severe bilateral hydronephrosis 1/19>> x-ray KUB: Dilated loops of small bowel-at least consistent with a partial SBO.   Significant microbiology data: 1/14>> COVID/influenza/RSV PCR: Negative 1/14>> blood culture: No growth 1/15>> urine culture: No growth   Consults: PCCM Nephrology Neurology IR   Assessment and plan: AKI Secondary to obstructive uropathy Presents with decreased urine output.  Found to have AKI with hyperkalemia upon admission.  Concern for infection.  Admitted to the ICU for vasopressor. Foley catheter in place to treat hypertension. Urology consulted. Nephrology and IR following. Given that the renal function did not improve and continues to have hydronephrosis IR placed bilateral PCN. Anticipating slow improvement in his renal function. Eventually will require internalization of the stent.  US  renal on 1/25 shows mild hydronephrosis.  Decompressed bladder. Nephrology currently signed off.   Hyperkalemia As well as hypokalemia Metabolic acidosis Secondary to AKI and poor p.o. intake. Monitor daily electrolyte. Now potassium is elevated and therefore patient is  receiving low Lokelma  and other therapy to lower it.   Multifactorial shock Secondary to septic shock due to UTI and hypovolemic shock from poor oral intake BP has stabilized with supportive care Briefly required pressors on initial presentation Blood pressure remains soft. Still on midodrine .  I will increase the dose.  Also giving albumin  and blood.   Complicated UTI Treated with Rocephin -although urine cultures negative-this was done after patient was given antibiotics   Ileus resolved. Patient with significant distention of his bowels. No definitive obstruction seen. Actually has a bowel movement and passing gas. Repeat x-ray on 1/23 shows improvement in ileus.   GERD Dyspeptic symptoms are much better Continue PPI As needed simethicone .   Normocytic anemia Acute blood loss anemia Likely secondary to critical illness-has some hematuria after PCN placement. Received 1 PRBC earlier. Another PRBC ordered 01/22 Repeat transfusion 1/24. Holding aspirin  and heparin  for DVT prophylaxis. Follow CBC   History of CAD s/p PCI 2008 No anginal symptoms   Chronic HFmrEF Euvolemic Diuretics on hold   HTN Presented with hypotension-all antihypertensives on hold-resume when able Currently actually on midodrine  still.   DM-2 (A1c 6.4 on 1/15) CBG stable-SSI   Anorexia/failure to thrive syndrome Poor oral intake for the past several months Patient not keen on getting NG tube placed Thankfully slowly improving-continue to encourage oral intake-continue supplements Remains on Remeron  Palliative care following.   Moderate Malnutrition Class I obesity. Body mass index is 30.96 kg/m.  Etiology: acute illness Signs/Symptoms: mild fat depletion, mild muscle depletion Interventions: Refer to RD note for recommendations Placing the patient at high risk for outcome.  Stage II coccyx ulcer, upper back pressure ulcers. Present on admission. Monitor.  Hematuria. Bilateral PCN  showing bloody discharge. IR consulted to reevaluate the PCN. For now recommending flush.  Left PCN has some blood but right PCN is now  clear. Monitor H&H.  Hypoalbuminemia with third spacing. Albumin  level is 2. Has lower extremity swelling.  Bilateral pleural effusion. Suspect this is third spacing related to hypoalbuminemia. Will eventually need to resume diuresis.  Goals of care conversation. 1/25 extensive discussion with patient at bedside.  Daughter and son at bedside as well. Patient feels that the way he is feeling right now he would like to go through an attempt of resuscitation understanding that there is a very high risk of his course after resuscitation may not be without any significant complications. Patient does not want to be on lifelong hemodialysis.  Patient also knows that he would likely be not a candidate for lifelong hemodialysis. Patient would like to attempt short-term ICU stay should he need 1.

## 2024-11-20 NOTE — Progress Notes (Signed)
 Occupational Therapy Treatment Patient Details Name: [name] MRN: [id number] DOB: [birth year] Today's Date: 11/20/2024   History of present illness 84 y.o. male admitted 11/11/24 with weakness, fatigue, decreased appetite, decreased urine output. Workup for severe AKI, septic shock secondary to UTI, severe bilateral hydronephrosis. Transfer out of ICU 1/18. Imaging 1/19 consistent with partial SBO. PMH includes HTN, HLD, DM, NSTEMI, ischemic cardiomyopathy, HF, CVA, heart murmur.   OT comments  Patient with fair progress toward patient focused goals.  Patient demonstrating improved activity tolerance with stand pivot transfers.  OT will continue efforts in the acute setting to address deficits, and Patient will benefit from continued inpatient follow up therapy, <3 hours/day.      If plan is discharge home, recommend the following:  A little help with walking and/or transfers;A little help with bathing/dressing/bathroom;Assistance with cooking/housework;Assist for transportation;Help with stairs or ramp for entrance   Equipment Recommendations  None recommended by OT    Recommendations for Other Services      Precautions / Restrictions Precautions Precautions: Fall Recall of Precautions/Restrictions: Intact Precaution/Restrictions Comments: watch BP Restrictions Weight Bearing Restrictions Per Provider Order: No       Mobility Bed Mobility Overal bed mobility: Needs Assistance Bed Mobility: Supine to Sit Rolling: Contact guard assist   Supine to sit: Contact guard, HOB elevated, Used rails          Transfers Overall transfer level: Needs assistance Equipment used: Rolling walker (2 wheels) Transfers: Sit to/from Stand, Bed to chair/wheelchair/BSC Sit to Stand: Contact guard assist     Step pivot transfers: Min assist, Contact guard assist           Balance Overall balance assessment: Needs assistance Sitting-balance support: No upper extremity supported,  Feet supported Sitting balance-Leahy Scale: Good     Standing balance support: Reliant on assistive device for balance Standing balance-Leahy Scale: Poor                             ADL either performed or assessed with clinical judgement   ADL   Eating/Feeding: Set up;Sitting   Grooming: Set up;Sitting           Upper Body Dressing : Set up;Sitting   Lower Body Dressing: Moderate assistance;Sit to/from stand   Toilet Transfer: Minimal assistance;Stand-pivot;BSC/3in1   Toileting- Clothing Manipulation and Hygiene: Maximal assistance;Sit to/from stand              Extremity/Trunk Assessment Upper Extremity Assessment Upper Extremity Assessment: Overall WFL for tasks assessed   Lower Extremity Assessment Lower Extremity Assessment: Defer to PT evaluation   Cervical / Trunk Assessment Cervical / Trunk Assessment: Normal    Vision Patient Visual Report: No change from baseline     Perception Perception Perception: Not tested   Praxis Praxis Praxis: Not tested   Communication Communication Communication: No apparent difficulties Factors Affecting Communication: Hearing impaired   Cognition Arousal: Alert Behavior During Therapy: WFL for tasks assessed/performed Cognition: No apparent impairments                               Following commands: Intact        Cueing   Cueing Techniques: Verbal cues, Gestural cues  Exercises      Shoulder Instructions       General Comments  VSS    Pertinent Vitals/ Pain       Pain Assessment Pain  Assessment: No/denies pain Pain Intervention(s): Monitored during session                                                          Frequency  Min 1X/week        Progress Toward Goals  OT Goals(current goals can now be found in the care plan section)  Progress towards OT goals: Progressing toward goals  Acute Rehab OT Goals OT Goal Formulation: With  patient Time For Goal Achievement: 11/30/24 Potential to Achieve Goals: Good  Plan      Co-evaluation                 AM-PAC OT 6 Clicks Daily Activity     Outcome Measure   Help from another person eating meals?: None Help from another person taking care of personal grooming?: None Help from another person toileting, which includes using toliet, bedpan, or urinal?: A Lot Help from another person bathing (including washing, rinsing, drying)?: A Little Help from another person to put on and taking off regular upper body clothing?: A Little Help from another person to put on and taking off regular lower body clothing?: A Lot 6 Click Score: 18    End of Session Equipment Utilized During Treatment: Gait belt;Rolling walker (2 wheels)  OT Visit Diagnosis: Unsteadiness on feet (R26.81);Muscle weakness (generalized) (M62.81);Other symptoms and signs involving cognitive function   Activity Tolerance Patient tolerated treatment well   Patient Left in chair;with call bell/phone within reach   Nurse Communication Mobility status        Time: 1358-1420 OT Time Calculation (min): 22 min  Charges: OT General Charges $OT Visit: 1 Visit OT Treatments $Self Care/Home Management : 8-22 mins  11/20/2024  RP, OTR/L  Acute Rehabilitation Services  Office:  [phone]   Charlie JONETTA Halsted 11/20/2024, 2:50 PM

## 2024-11-20 NOTE — Progress Notes (Signed)
 [facility] Progress Note Patient: [name] FMW:[id number] DOB: 1941/10/19  DOA: 11/11/2024 DOS: the patient was seen and examined on 11/20/2024  Brief Summary: Patient is a 84 y.o.  male with history of CAD s/p PCI 2008, HFmrEF, HTN, HLD-presented with generalized weakness/fatigue-loss of appetite-found to be hypotensive with AKI-thought to have obstructive uropathy.  Initially admitted to the ICU for pressors-stabilized and subsequently transferred to [facility].   Significant events: 1/14>> admit to ICU 1/16>> received 1 unit PRBC. 1/19>> transferred to [facility]. 1/21>> bilateral PCN performed. 1/22>> 1 unit PRBC given.   Significant studies: 1/14>> CXR: No PNA 1/14>> CT head: No acute intracranial process 1/14>> CT abdomen/pelvis: Severe bilateral hydronephrosis 1/14>> renal ultrasound: Severe bilateral hydronephrosis. 1/15>> echo: EF 55-60% 1/17>> renal ultrasound: Severe bilateral hydronephrosis 1/19>> x-ray KUB: Dilated loops of small bowel-at least consistent with a partial SBO.   Significant microbiology data: 1/14>> COVID/influenza/RSV PCR: Negative 1/14>> blood culture: No growth 1/15>> urine culture: No growth   Consults: PCCM Nephrology Neurology IR   Assessment and plan: AKI Secondary to obstructive uropathy Presents with decreased urine output.  Found to have AKI with hyperkalemia upon admission.  Concern for infection.  Admitted to the ICU for vasopressor. Foley catheter in place to treat hypertension. Urology consulted. Nephrology and IR following. Given that the renal function did not improve and continues to have hydronephrosis IR placed bilateral PCN. Anticipating slow improvement in his renal function.   Hyperkalemia As well as hypokalemia Metabolic acidosis Secondary to AKI and poor p.o. intake. Monitor daily electrolyte. Maintain K more than 4.  Mag more than 2.   Multifactorial shock Secondary to septic shock due to UTI and hypovolemic shock  from poor oral intake BP has stabilized with supportive care Briefly required pressors on initial presentation Blood pressure remains soft. Still on midodrine .  Receiving IV albumin  today.   Complicated UTI Treated with Rocephin -although urine cultures negative-this was done after patient was given antibiotics   Ileus Patient with significant distention of his bowels. No definitive obstruction seen. Actually has a bowel movement and passing gas. Aggressively treating ileus with correction of electrolytes, MiraLAX . Repeat x-ray on 1/23 shows improvement in ileus.   GERD Dyspeptic symptoms are much better Continue PPI As needed simethicone .   Normocytic anemia Likely secondary to critical illness-has some hematuria after PCN placement. Received 1 PRBC earlier. Another PRBC ordered 01/22 with stable hemoglobin for now. Holding aspirin  and heparin  for DVT prophylaxis. Follow CBC   History of CAD s/p PCI 2008 No anginal symptoms   Chronic HFmrEF Euvolemic Diuretics on hold   HTN Presented with hypotension-all antihypertensives on hold-resume when able Currently actually on midodrine  still.   DM-2 (A1c 6.4 on 1/15) CBG stable-SSI   Anorexia/failure to thrive syndrome Poor oral intake for the past several months Patient not keen on getting NG tube placed Thankfully slowly improving-continue to encourage oral intake-continue supplements Remains on Remeron  Palliative care following.   Moderate Malnutrition Class I obesity. Body mass index is 30.96 kg/m.  Etiology: acute illness Signs/Symptoms: mild fat depletion, mild muscle depletion Interventions: Refer to RD note for recommendations Placing the patient at high risk for outcome.  Stage II coccyx ulcer, upper back pressure ulcers. Present on admission. Monitor.  Hematuria. Bilateral PCN showing bloody discharge. IR consulted to reevaluate the PCN. For now recommending flush. Monitor H&H.    Code Status: Full  Code   DVT Prophylaxis: SCDs Start: 11/11/24 1516  Data review I have Reviewed nursing notes, Vitals, and Lab results.  Since last encounter, pertinent lab results CBC and BMP   . I have ordered test including CBC and BMP  . I have discussed pt's care plan and test results with urology and IR  .  Ordered x-ray abdomen.  Physical exam. Vitals:   11/20/24 0400 11/20/24 0500 11/20/24 0813 11/20/24 1300  BP: (!) 105/44  (!) 98/44 (!) 92/34  Pulse: 80   81  Resp: 17   16  Temp: 97.6 F (36.4 C)  97.7 F (36.5 C) 98 F (36.7 C)  TempSrc: Oral  Axillary Oral  SpO2: 96%   98%  Weight:  84.4 kg    Height:        General: in Mild distress, No Rash Cardiovascular: S1 and S2 Present, No Murmur Respiratory: Good respiratory effort, Bilateral Air entry present.  Faint basal crackles, No wheezes Abdomen: Bowel Sound present, No tenderness.  Blood in the PCN bilaterally. Extremities: No edema Neuro: Alert and oriented x3, no new focal deficit   Subjective: Feeling better.  No nausea no vomiting.  No other acute complaint.  Family Communication: Family at bedside.  Disposition Plan: Status is: Inpatient Remains inpatient appropriate because: Monitor for improvement in H&H.   Planned Discharge Destination:Skilled nursing facility Diet: Diet Order             Diet regular Room service appropriate? Yes; Fluid consistency: Thin  Diet effective now                   MEDICATIONS: Scheduled Meds:  sodium chloride    Intravenous Once   Chlorhexidine  Gluconate Cloth  6 each Topical Daily   Gerhardt's butt cream  1 Application Topical TID   insulin  aspart  0-9 Units Subcutaneous TID WC   midodrine   10 mg Oral TID WC   mirtazapine   15 mg Oral QHS   multivitamin  1 tablet Oral QHS   pantoprazole   40 mg Oral BID   saccharomyces boulardii  250 mg Oral BID   simethicone   80 mg Oral QID   sodium chloride  flush  10-40 mL Intracatheter Q12H   sodium chloride  flush  5 mL Intracatheter  Q12H   Continuous Infusions: PRN Meds:.acetaminophen , ondansetron  (ZOFRAN ) IV, polyethylene glycol, sodium chloride  flush  Author: Yetta Blanch, MD  [facility] 11/20/2024  6:49 PM Between 7PM-7AM, please contact night-coverage, check [email or web address] for on call.

## 2024-11-20 NOTE — TOC Progression Note (Signed)
 Transition of Care [facility]) - Progression Note    Patient Details  Name: [name] MRN: [id number] Date of Birth: Feb 18, 1941  [facility] of [facility]) CM/SW Contact  Inocente GORMAN Kindle, LCSW Phone Number: 11/20/2024, 8:51 AM  Clinical Narrative:    Requested [facility] review referral if they have any beds opening next week.   Expected Discharge Plan: Skilled Nursing Facility Barriers to Discharge: Continued Medical Work up, English As A Second Language Teacher               Expected Discharge Plan and Services In-house Referral: Clinical Social Work     Living arrangements for the past 2 months: Single Family Home                                       Social Drivers of Health (SDOH) Interventions SDOH Screenings   Food Insecurity: No Food Insecurity (11/11/2024)  Housing: Low Risk (11/11/2024)  Transportation Needs: No Transportation Needs (11/11/2024)  Utilities: Not At Risk (11/11/2024)  Social Connections: Moderately Integrated (11/11/2024)  Tobacco Use: Medium Risk (11/11/2024)    Readmission Risk Interventions     No data to display

## 2024-11-20 NOTE — Plan of Care (Signed)

## 2024-11-20 NOTE — Progress Notes (Addendum)
 "   Referring Physician(s): Delia Smalls, NP  Supervising Physician: Vanice Revel  Patient Status:  [facility] - In-pt  Chief Complaint: bilateral hydronephrosis; bilateral nephrostomy tubes placed 1/21 by Dr. Philip  Subjective: Patient lying in bed. Reports bloody drainage in both PCN bags. Denies pain with PCNs. Reports feeling sore from being in the bed a lot. Family at bedside.   Allergies: Patient has no known allergies.  Medications: Prior to Admission medications  Medication Sig Start Date End Date Taking? Authorizing Provider  aspirin  81 MG tablet Take 81 mg by mouth at bedtime.   Yes [provider]  atorvastatin  (LIPITOR) 80 MG tablet Take 1 tablet (80 mg total) by mouth daily. Patient taking differently: Take 80 mg by mouth at bedtime. 11/03/24  Yes Jordan, Peter M, MD  carvedilol  (COREG ) 12.5 MG tablet Take 1 tablet (12.5 mg total) by mouth 2 (two) times daily. 11/03/24 02/01/25 Yes Jordan, Peter M, MD  dapagliflozin propanediol (FARXIGA) 5 MG TABS tablet Take 5 mg by mouth at bedtime.   Yes [provider]  furosemide  (LASIX ) 20 MG tablet Take 1 tablet (20 mg total) by mouth daily. 11/03/24  Yes Jordan, Peter M, MD  losartan  (COZAAR ) 100 MG tablet Take 1 tablet (100 mg total) by mouth daily. 11/03/24  Yes Jordan, Peter M, MD  Lutein-Zeaxanthin (OCUVITE LUTEIN 25 PO) Take 1 capsule by mouth at bedtime.   Yes [provider]  Multiple Vitamins-Minerals (CENTRUM SILVER PO) Take 1 tablet by mouth daily.    Yes [provider]  nitroGLYCERIN  (NITROSTAT ) 0.4 MG SL tablet Place 1 tablet (0.4 mg total) under the tongue every 5 (five) minutes as needed for chest pain (x 3 doses). 11/03/24  Yes Jordan, Peter M, MD  spironolactone  (ALDACTONE ) 25 MG tablet Take 1/2 tablet by mouth (12.5 mg) daily 11/03/24  Yes Jordan, Peter M, MD     Vital Signs: BP (!) 98/44 (BP Location: Right Arm)   Pulse 80   Temp 97.7 F (36.5 C) (Axillary)   Resp 17   Ht 5' 5  (1.651 m)   Wt 186 lb 1.1 oz (84.4 kg)   SpO2 96%   BMI 30.96 kg/m   Physical Exam Cardiovascular:     Rate and Rhythm: Normal rate.  Pulmonary:     Effort: Pulmonary effort is normal. No respiratory distress.  Genitourinary:    Comments: Bilateral PCNs- Bloody drainage in both gravity bags Both PCNs flush easily Insertion sites unremarkable Sutures intact and in place Dressed appropriately  Foley with scant cloudy blood-tinged urine in bag Skin:    General: Skin is warm.  Neurological:     Mental Status: He is alert and oriented to person, place, and time.  Psychiatric:        Mood and Affect: Mood normal.        Behavior: Behavior normal.     Imaging: IR NEPHROSTOMY PLACEMENT BILATERAL Result Date: 11/18/2024 INDICATION: 84 year old with bilateral hydronephrosis, BPH/bladder outlet obstruction and acute kidney injury. EXAM: PLACEMENT OF BILATERAL NEPHROSTOMY TUBES WITH ULTRASOUND AND FLUOROSCOPIC GUIDANCE Physician: Juliene SAUNDERS. Philip, MD COMPARISON:  Renal ultrasound 11/14/2024 MEDICATIONS: Versed  0.5 mg ANESTHESIA/SEDATION: Patient's level of consciousness and vital signs were monitored continuously by radiology nurse throughout the procedure under the supervision of the provider performing the procedure. CONTRAST:  15 mL OMNIPAQUE  IOHEXOL  300 MG/ML SOLN - administered into the collecting system(s) FLUOROSCOPY: Radiation Exposure Index (as provided by the fluoroscopic device): 6 mGy Kerma COMPLICATIONS: None immediate. PROCEDURE: The procedure  was explained to the patient. The risks and benefits of the procedure were discussed and the patient's questions were addressed. Informed consent was obtained from the patient. Patient was placed prone. Both flanks were prepped and draped in sterile fashion. Maximal barrier sterile technique was utilized including caps, mask, sterile gowns, sterile gloves, sterile drape, hand hygiene and skin antiseptic. Ultrasound identified the left kidney. Skin  was anesthetized with 1% lidocaine . Small incision was made. Using ultrasound guidance, a 21 gauge needle was directed into a dilated calyx in the midpole region. Contrast injection confirmed placement in the collecting system. 0.018 wire was advanced in the collecting system. Accustick dilator set was placed. Tract was dilated over a superstiff Amplatz wire and a 10 French multipurpose drain was reconstituted in the renal pelvis. Urine was aspirated. Contrast injection confirmed placement in the renal pelvis. Drain was sutured to the skin. Drain was attached to a gravity bag. Ultrasound identified the right kidney. Skin was anesthetized with 1% lidocaine . Small incision was made. Using ultrasound guidance, a 21 gauge needle was directed into a dilated calyx in the midpole region. Contrast injection confirmed placement in the collecting system. 0.018 wire was advanced in the collecting system. Accustick dilator set was placed. Tract was dilated over a superstiff Amplatz wire and a 10 French multipurpose drain was reconstituted in the renal pelvis. Urine was aspirated. Contrast injection confirmed placement in the renal pelvis. Drain was sutured to the skin. Drain was attached to a gravity bag. Fluoroscopic and ultrasound images were taken and saved for documentation. FINDINGS: Ultrasound demonstrated moderate bilateral hydronephrosis. Left nephrostomy tube is reconstituted in left renal pelvis. Right nephrostomy tube is reconstituted in the right renal pelvis. IMPRESSION: Successful placement of bilateral percutaneous nephrostomy tubes using ultrasound and fluoroscopic guidance. Electronically Signed   By: Juliene Balder M.D.   On: 11/18/2024 16:49   DG Abd Portable 1V Result Date: 11/18/2024 CLINICAL DATA:  Small bowel obstruction. EXAM: PORTABLE ABDOMEN - 1 VIEW COMPARISON:  Radiograph 11/16/2024, CT 11/11/2024 FINDINGS: Bilateral nephrostomy tubes in place. Gaseous distension of large and small bowel. No  significant formed colonic stool. No evidence of free air in the supine views. IMPRESSION: Gaseous distension of large and small bowel, favor ileus. Electronically Signed   By: Andrea Gasman M.D.   On: 11/18/2024 13:42    Labs:  CBC: Recent Labs    11/17/24 0156 11/18/24 0302 11/19/24 0339 11/20/24 0323  WBC 11.8* 10.9* 9.8 8.9  HGB 8.2* 8.3* 7.9* 8.4*  HCT 25.9* 26.5* 24.7* 26.1*  PLT 195 214 200 190    COAGS: Recent Labs    11/14/24 0447  INR 1.3*    BMP: Recent Labs    11/17/24 0156 11/18/24 0302 11/19/24 0339 11/20/24 0322  NA 138 143  143 141 140  K 3.0* 3.2*  3.2* 3.5 3.6  CL 103 107  107 107 109  CO2 20* 22  22 21* 21*  GLUCOSE 154* 96  99 93 105*  BUN 121* 114*  115* 109* 98*  CALCIUM  7.6* 7.9*  7.9* 7.6* 7.5*  CREATININE 6.68* 6.48*  6.52* 6.22* 5.97*  GFRNONAA 8* 8*  8* 8* 9*    LIVER FUNCTION TESTS: Recent Labs    11/11/24 1425 11/11/24 1643 11/17/24 0156 11/18/24 0302 11/19/24 0339 11/20/24 0322  BILITOT 0.3 0.2  --   --   --   --   AST 19 19  --   --   --   --   ALT 20  18  --   --   --   --   ALKPHOS 78 62  --   --   --   --   PROT 5.7* 4.9*  --   --   --   --   ALBUMIN 2.5* 2.2* 1.9* 2.2* 2.1* 2.1*    Assessment and Plan:  Hgb 7.9 yesterday, transfused 1 unit of PRBC last night, Hgb 8.4 today   Drain Location: right CVA Size: Fr size: 10 French [place] Date of placement: 11/18/2024 Currently to: Drain collection device: gravity bag 24 hour output: 450 mL documented   Drain Location: left CVA Size: Fr size: 10 French [place] Date of placement: 11/18/2024 Currently to: Drain collection device: gravity bag 24 hour output: 375 mL documented  Current examination: Bloody drainage in both gravity bags. Both PCNs flush easily.  Insertion sites unremarkable. Sutures intact and in place. Dressed appropriately.    Plan: Ordered BID flushes with 5 cc NS. Record output Q shift. Dressing changes QD or PRN if soiled.  Call  IR APP or on call IR MD if difficulty flushing or sudden change in drain output.    Discharge planning: Please contact IR APP or on call IR MD prior to patient d/c to ensure appropriate follow up plans are in place. Patient will need nephrostomy exchange in 6-8 weeks. IR scheduler will contact patient with date/time of appointment. Record output QD, dressing changes every 2-3 days or earlier if soiled.    Patient will need ongoing urology follow up.   IR will continue to follow - please call with questions or concerns.   Electronically Signed: Anita Mcadory B Lenus Trauger, NP 11/20/2024, 1:25 PM   I spent a total of 15 Minutes at the the patient's bedside AND on the patient's hospital floor or unit, greater than 50% of which was counseling/coordinating care for follow up for bilateral nephrostomy tubes.  "

## 2024-11-20 NOTE — Progress Notes (Signed)
 Physical Therapy Treatment Patient Details Name: BRAND SIEVER MRN: [id number] DOB: Apr 21, 1941 Today's Date: 11/20/2024   History of Present Illness 84 y.o. male admitted 11/11/24 with weakness, fatigue, decreased appetite, decreased urine output. Workup for severe AKI, septic shock secondary to UTI, severe bilateral hydronephrosis. Transfer out of ICU 1/18. Imaging 1/19 consistent with partial SBO. PMH includes HTN, HLD, DM, NSTEMI, ischemic cardiomyopathy, HF, CVA, heart murmur.    PT Comments  Pt presents with decreased dizziness with functional mobility and improved tolerance to activity. BP monitored in standing before and after activity, continues to be soft but [facility] for activity. Initial dizziness reported upon standing but improved with time. Pt initially requires minA for STS and improved to CGA with additional attempt. Pt ambulates short distances at this time with one seated rest break taken to manage fatigue, chair follow present throughout. Pt takes short steps and experiences multiple minor bouts of knee buckling with LOB that requires minA to correct. Pt would benefit from continued PT services focused on balance, strength, and progressing gait to promote improved tolerance and independence with functional mobility.     If plan is discharge home, recommend the following: A little help with walking and/or transfers;A lot of help with bathing/dressing/bathroom;Assistance with cooking/housework;Direct supervision/assist for medications management;Direct supervision/assist for financial management;Assist for transportation;Help with stairs or ramp for entrance   Can travel by private vehicle     No  Equipment Recommendations  Rolling walker (2 wheels)    Recommendations for Other Services       Precautions / Restrictions Precautions Precautions: Fall Recall of Precautions/Restrictions: Intact Precaution/Restrictions Comments: watch BP Restrictions Weight Bearing Restrictions Per  Provider Order: No     Mobility  Bed Mobility               General bed mobility comments: Pt sitting in recliner upon arrival.    Transfers Overall transfer level: Needs assistance Equipment used: Rolling walker (2 wheels) Transfers: Sit to/from Stand Sit to Stand: Contact guard assist, Min assist           General transfer comment: Takes increased time to come to full stance. MinA for initial STS due to knee buckling upon standing. Improves to CGA with second attempt. Additional cues needed for hand placement, some difficulty finding arm rests.    Ambulation/Gait Ambulation/Gait assistance: Min assist, Contact guard assist Gait Distance (Feet): 15 Feet (15'x2) Assistive device: Rolling walker (2 wheels) Gait Pattern/deviations: Step-to pattern, Decreased stride length, Knees buckling, Trunk flexed   Gait velocity interpretation: <1.31 ft/sec, indicative of household ambulator   General Gait Details: Audible grinding and popping from B knees, occasional knee buckle causing minor LOB, corrects with minA. Does well managing walker, some difficulty following positional cues. Chair follow present, pt takes one seated rest break. Initial dizziness that improves with time, BP soft but within acceptable limits.   Stairs             Wheelchair Mobility     Tilt Bed    Modified Rankin (Stroke Patients Only)       Balance Overall balance assessment: Needs assistance Sitting-balance support: No upper extremity supported, Feet supported Sitting balance-Leahy Scale: Good Sitting balance - Comments: No sitting balance concerns.   Standing balance support: Reliant on assistive device for balance, Bilateral upper extremity supported, During functional activity Standing balance-Leahy Scale: Poor Standing balance comment: Reliant on walker and external support for balance during standing tasks. Minor LOB with knee buckling, minA required to correct.  Communication Communication Communication: No apparent difficulties Factors Affecting Communication: Hearing impaired  Cognition Arousal: Alert Behavior During Therapy: WFL for tasks assessed/performed   PT - Cognitive impairments: Problem solving                       PT - Cognition Comments: Some difficulty with word finding. Following commands: Intact Following commands impaired: Only follows one step commands consistently, Follows multi-step commands with increased time    Cueing Cueing Techniques: Verbal cues, Gestural cues, Visual cues, Tactile cues  Exercises General Exercises - Lower Extremity Ankle Circles/Pumps: AROM, Both, 5 reps, Seated Long Arc Quad: AROM, Both, 5 reps, Seated Hip Flexion/Marching: AROM, Both, 5 reps, Seated    General Comments General comments (skin integrity, edema, etc.): Standing BP 97/49. BP after activity 113/56. Drains intact. No new skin abnormalities noted.      Pertinent Vitals/Pain Pain Assessment Pain Assessment: No/denies pain    Home Living                          Prior Function            PT Goals (current goals can now be found in the care plan section) Acute Rehab PT Goals Patient Stated Goal: return home; children interested in SNF rehab PT Goal Formulation: With patient Time For Goal Achievement: 11/30/24 Progress towards PT goals: Progressing toward goals    Frequency    Min 3X/week      PT Plan      Co-evaluation              AM-PAC PT 6 Clicks Mobility   Outcome Measure  Help needed turning from your back to your side while in a flat bed without using bedrails?: A Little Help needed moving from lying on your back to sitting on the side of a flat bed without using bedrails?: A Little Help needed moving to and from a bed to a chair (including a wheelchair)?: A Little Help needed standing up from a chair using your arms (e.g., wheelchair or bedside chair)?: A  Little Help needed to walk in hospital room?: A Lot Help needed climbing 3-5 steps with a railing? : Total 6 Click Score: 15    End of Session   Activity Tolerance: Patient tolerated treatment well Patient left: in chair;with call bell/phone within reach;with family/visitor present Nurse Communication: Mobility status PT Visit Diagnosis: Unsteadiness on feet (R26.81);Muscle weakness (generalized) (M62.81)     Time: 8543-8470 PT Time Calculation (min) (ACUTE ONLY): 33 min  Charges:    $Gait Training: 23-37 mins PT General Charges $$ ACUTE PT VISIT: 1 Visit                     Sabra Morel, PT, DPT  Acute Rehabilitation Services         Office: [phone]      Sabra MARLA Morel 11/20/2024, 3:55 PM

## 2024-11-20 NOTE — Progress Notes (Addendum)
 Patient ID: [name], male   DOB: Aug 07, 1941, 84 y.o.   MRN: [id number] [facility] Progress Note   Assessment/ Plan:   1. Acute kidney Injury: Likely secondary to obstruction/obstructive uropathy.  Nonoliguric and continues to have sluggish but consistent renal recovery status post placement of Foley catheter and bilateral percutaneous nephrostomy tubes (by IR).  No acute electrolyte abnormality/indications for dialysis.  Continue daily labs for ongoing monitoring of renal function/electrolytes. 2.  Urinary tract infection: With progressive BPH/bladder outlet obstruction status post Foley catheter placement.  On empiric antibiotic coverage with ceftriaxone  with urine cultures negative to date.  Appreciate urology input. 3.  Anemia: Likely secondary to chronic illness.  Status post PRBC transfusion 11/13/2024 and without overt blood loss. 4.  Leukocytosis: Improving slowly on empiric ceftriaxone  therapy.  Urine and blood cultures negative to date.  Subjective:   Concerned about bloody urine in right nephrostomy bag   Objective:   BP (!) 98/44 (BP Location: Right Arm)   Pulse 80   Temp 97.7 F (36.5 C) (Axillary)   Resp 17   Ht 5' 5 (1.651 m)   Wt 84.4 kg   SpO2 96%   BMI 30.96 kg/m   Intake/Output Summary (Last 24 hours) at 11/20/2024 1125 Last data filed at 11/20/2024 1054 Gross per 24 hour  Intake 364.25 ml  Output 1025 ml  Net -660.75 ml   Weight change: -0.7 kg  Physical Exam: Gen: Resting comfortably in bed.  Daughter at bedside CVS: Pulse regular rhythm, normal rate, S1 and S2 normal Resp: Clear to auscultation, no rales/rhonchi Abd: Soft, flat, nontender, bowel sounds normal.  Nephrostomy tubes in situ; right bag with hematuria Ext: No lower extremity edema  Imaging: DG Abd Portable 1V Result Date: 11/18/2024 CLINICAL DATA:  Small bowel obstruction. EXAM: PORTABLE ABDOMEN - 1 VIEW COMPARISON:  Radiograph 11/16/2024, CT 11/11/2024 FINDINGS: Bilateral  nephrostomy tubes in place. Gaseous distension of large and small bowel. No significant formed colonic stool. No evidence of free air in the supine views. IMPRESSION: Gaseous distension of large and small bowel, favor ileus. Electronically Signed   By: Andrea Gasman M.D.   On: 11/18/2024 13:42     Labs: BMET Recent Labs  Lab 11/14/24 0447 11/14/24 2000 11/15/24 0416 11/15/24 1659 11/16/24 0753 11/17/24 0156 11/18/24 0302 11/19/24 0339 11/20/24 0322  NA 134*   < > 136 140 139 138 143  143 141 140  K 3.5   < > 3.8 4.0 3.5 3.0* 3.2*  3.2* 3.5 3.6  CL 97*   < > 100 101 102 103 107  107 107 109  CO2 21*   < > 21* 16* 18* 20* 22  22 21* 21*  GLUCOSE 116*   < > 86 84 87 154* 96  99 93 105*  BUN 129*   < > 122* 118* 119* 121* 114*  115* 109* 98*  CREATININE 7.26*   < > 6.86* 6.74* 6.84* 6.68* 6.48*  6.52* 6.22* 5.97*  CALCIUM  7.6*   < > 7.6* 8.0* 8.2* 7.6* 7.9*  7.9* 7.6* 7.5*  PHOS 4.9*  --  5.4*  --  6.3* 5.1* 5.0* 4.4 4.1   < > = values in this interval not displayed.   CBC Recent Labs  Lab 11/17/24 0156 11/18/24 0302 11/19/24 0339 11/20/24 0323  WBC 11.8* 10.9* 9.8 8.9  HGB 8.2* 8.3* 7.9* 8.4*  HCT 25.9* 26.5* 24.7* 26.1*  MCV 101.2* 101.5* 102.5* 97.4  PLT 195 214 200 190  Medications:     sodium chloride    Intravenous Once   Chlorhexidine  Gluconate Cloth  6 each Topical Daily   Gerhardt's butt cream  1 Application Topical TID   insulin  aspart  0-9 Units Subcutaneous TID WC   midodrine   10 mg Oral TID WC   mirtazapine   15 mg Oral QHS   multivitamin  1 tablet Oral QHS   pantoprazole   40 mg Oral BID   potassium chloride   40 mEq Oral Q2H   saccharomyces boulardii  250 mg Oral BID   simethicone   80 mg Oral QID   sodium chloride  flush  10-40 mL Intracatheter Q12H    Gordy Blanch, MD 11/20/2024, 11:25 AM

## 2024-11-20 NOTE — TOC Progression Note (Signed)
 Transition of Care [facility]) - Progression Note    Patient Details  Name: SUHAN PACI MRN: [id number] Date of Birth: 1940-12-07  [facility] of [facility]) CM/SW Contact  Sharyne Drum, Student-Social Work Phone Number: 11/20/2024, 11:15 AM  Clinical Narrative:     MSW Student met with pt and family at bedside. Updated them on CIR declining. Family wants more SNF options, CSW followed up with [facility] and will follow up with [facility] and other SNF options. CSW will continue to follow.   Expected Discharge Plan: Skilled Nursing Facility Barriers to Discharge: Continued Medical Work up, English As A Second Language Teacher               Expected Discharge Plan and Services In-house Referral: Clinical Social Work     Living arrangements for the past 2 months: Single Family Home                                       Social Drivers of Health (SDOH) Interventions SDOH Screenings   Food Insecurity: No Food Insecurity (11/11/2024)  Housing: Low Risk (11/11/2024)  Transportation Needs: No Transportation Needs (11/11/2024)  Utilities: Not At Risk (11/11/2024)  Social Connections: Moderately Integrated (11/11/2024)  Tobacco Use: Medium Risk (11/11/2024)    Readmission Risk Interventions     No data to display

## 2024-11-20 NOTE — Progress Notes (Addendum)
" ° °  °  Subjective: NAEON. Reviewed case and plan with patient and both children. Pt resting comfortably in bed.   Objective: Vital signs in last 24 hours: Temp:  [97.6 F (36.4 C)-99 F (37.2 C)] 97.7 F (36.5 C) (01/23 0813) Pulse Rate:  [64-80] 80 (01/23 0400) Resp:  [11-19] 17 (01/23 0400) BP: (90-109)/(38-53) 98/44 (01/23 0813) SpO2:  [92 %-100 %] 96 % (01/23 0400) Weight:  [84.4 kg] 84.4 kg (01/23 0500)  Assessment/Plan: # Gross hematuria # Anemia # ARF # BPH  Bilateral percutaneous nephrostomy tubes placed in IR 11/18/2024.  Clear yellow urine in right PCN T yesterday.  Frank blood overnight with required transfusion.  IR following.  Left side with ongoing bloody drainage.  Interval improvement in serum creatinine.  Continue trending labs.  Reviewed case and plan with patient and son at the bedside.  They understand there is a waiting game at this point and with his baseline CKD 3B, recovery will happen slowly.  Nursing to hand irrigate Foley today with and remove.   Urology will follow peripherally over the weekend.  I will plan on checking on him again at the beginning of the week.  Intake/Output from previous day: 01/22 0701 - 01/23 0700 In: 704.3 [P.O.:350; Blood:354.3] Out: 1025 [Urine:1025]  Intake/Output this shift: Total I/O In: 10 [I.V.:10] Out: -   Physical Exam:  General: Alert and oriented CV: No cyanosis Lungs: equal chest rise Gu: foley catheter in with clear yellow urine, significant cloudy sediment.  Lab Results: Recent Labs    11/18/24 0302 11/19/24 0339 11/20/24 0323  HGB 8.3* 7.9* 8.4*  HCT 26.5* 24.7* 26.1*   BMET Recent Labs    11/19/24 0339 11/20/24 0322 11/20/24 0323  NA 141 140  --   K 3.5 3.6  --   CL 107 109  --   CO2 21* 21*  --   GLUCOSE 93 105*  --   BUN 109* 98*  --   CREATININE 6.22* 5.97*  --   CALCIUM  7.6* 7.5*  --   HGB 7.9*  --  8.4*  WBC 9.8  --  8.9     Studies/Results: No results found.     LOS: 9  days   Ole Bourdon, NP [facility] Pager: [phone]  11/20/2024, 1:06 PM  "

## 2024-11-20 NOTE — Plan of Care (Signed)

## 2024-11-21 DIAGNOSIS — N170 Acute kidney failure with tubular necrosis: Secondary | ICD-10-CM | POA: Diagnosis not present

## 2024-11-21 LAB — RENAL FUNCTION PANEL
Albumin: 2.3 g/dL — ABNORMAL LOW (ref 3.5–5.0)
Albumin: 2.3 g/dL — ABNORMAL LOW (ref 3.5–5.0)
Anion gap: 10 (ref 5–15)
Anion gap: 9 (ref 5–15)
BUN: 90 mg/dL — ABNORMAL HIGH (ref 8–23)
BUN: 88 mg/dL — ABNORMAL HIGH (ref 8–23)
CO2: 16 mmol/L — ABNORMAL LOW (ref 22–32)
CO2: 18 mmol/L — ABNORMAL LOW (ref 22–32)
Calcium: 7.5 mg/dL — ABNORMAL LOW (ref 8.9–10.3)
Calcium: 7.6 mg/dL — ABNORMAL LOW (ref 8.9–10.3)
Chloride: 109 mmol/L (ref 98–111)
Chloride: 109 mmol/L (ref 98–111)
Creatinine, Ser: 5.51 mg/dL — ABNORMAL HIGH (ref 0.61–1.24)
Creatinine, Ser: 5.38 mg/dL — ABNORMAL HIGH (ref 0.61–1.24)
GFR, Estimated: 10 mL/min — ABNORMAL LOW
GFR, Estimated: 10 mL/min — ABNORMAL LOW
Glucose, Bld: 89 mg/dL (ref 70–99)
Glucose, Bld: 114 mg/dL — ABNORMAL HIGH (ref 70–99)
Phosphorus: 2.7 mg/dL (ref 2.5–4.6)
Phosphorus: 2.6 mg/dL (ref 2.5–4.6)
Potassium: 5.4 mmol/L — ABNORMAL HIGH (ref 3.5–5.1)
Potassium: 5.8 mmol/L — ABNORMAL HIGH (ref 3.5–5.1)
Sodium: 135 mmol/L (ref 135–145)
Sodium: 136 mmol/L (ref 135–145)

## 2024-11-21 LAB — CBC
HCT: 23.8 % — ABNORMAL LOW (ref 39.0–52.0)
HCT: 25 % — ABNORMAL LOW (ref 39.0–52.0)
Hemoglobin: 7.7 g/dL — ABNORMAL LOW (ref 13.0–17.0)
Hemoglobin: 8 g/dL — ABNORMAL LOW (ref 13.0–17.0)
MCH: 31.6 pg (ref 26.0–34.0)
MCH: 31.9 pg (ref 26.0–34.0)
MCHC: 32.4 g/dL (ref 30.0–36.0)
MCHC: 32 g/dL (ref 30.0–36.0)
MCV: 97.5 fL (ref 80.0–100.0)
MCV: 99.6 fL (ref 80.0–100.0)
Platelets: 188 10*3/uL (ref 150–400)
Platelets: 191 10*3/uL (ref 150–400)
RBC: 2.44 MIL/uL — ABNORMAL LOW (ref 4.22–5.81)
RBC: 2.51 MIL/uL — ABNORMAL LOW (ref 4.22–5.81)
RDW: 18.6 % — ABNORMAL HIGH (ref 11.5–15.5)
RDW: 18.5 % — ABNORMAL HIGH (ref 11.5–15.5)
WBC: 7.7 10*3/uL (ref 4.0–10.5)
WBC: 7 10*3/uL (ref 4.0–10.5)
nRBC: 0 % (ref 0.0–0.2)
nRBC: 0 % (ref 0.0–0.2)

## 2024-11-21 LAB — GLUCOSE, CAPILLARY
Glucose-Capillary: 91 mg/dL (ref 70–99)
Glucose-Capillary: 117 mg/dL — ABNORMAL HIGH (ref 70–99)
Glucose-Capillary: 130 mg/dL — ABNORMAL HIGH (ref 70–99)

## 2024-11-21 LAB — PREPARE RBC (CROSSMATCH)

## 2024-11-21 LAB — MAGNESIUM: Magnesium: 1.8 mg/dL (ref 1.7–2.4)

## 2024-11-21 MED ORDER — MIDODRINE HCL 5 MG PO TABS
15.0000 mg | ORAL_TABLET | Freq: Three times a day (TID) | ORAL | Status: DC
  Administered 2024-11-21 – 2024-11-29 (×24): 15 mg via ORAL
  Filled 2024-11-21 (×25): qty 3

## 2024-11-21 MED ORDER — ALBUTEROL SULFATE (2.5 MG/3ML) 0.083% IN NEBU
10.0000 mg | INHALATION_SOLUTION | Freq: Once | RESPIRATORY_TRACT | Status: AC
  Administered 2024-11-21: 10 mg via RESPIRATORY_TRACT
  Filled 2024-11-21: qty 12

## 2024-11-21 MED ORDER — SODIUM BICARBONATE 650 MG PO TABS
650.0000 mg | ORAL_TABLET | Freq: Two times a day (BID) | ORAL | Status: DC
  Administered 2024-11-21 (×2): 650 mg via ORAL
  Filled 2024-11-21 (×2): qty 1

## 2024-11-21 MED ORDER — SODIUM CHLORIDE 0.9% IV SOLUTION: Freq: Once | INTRAVENOUS | Status: DC

## 2024-11-21 MED ORDER — CALCIUM GLUCONATE-NACL 1-0.675 GM/50ML-% IV SOLN
1.0000 g | Freq: Once | INTRAVENOUS | Status: AC
  Administered 2024-11-21: 1000 mg via INTRAVENOUS
  Filled 2024-11-21: qty 50

## 2024-11-21 MED ORDER — ALBUMIN HUMAN 5 % IV SOLN
12.5000 g | Freq: Once | INTRAVENOUS | Status: AC
  Administered 2024-11-21: 12.5 g via INTRAVENOUS
  Filled 2024-11-21: qty 250

## 2024-11-21 MED ORDER — SODIUM BICARBONATE 8.4 % IV SOLN
50.0000 meq | Freq: Once | INTRAVENOUS | Status: AC
  Administered 2024-11-21: 50 meq via INTRAVENOUS
  Filled 2024-11-21: qty 50

## 2024-11-21 MED ORDER — SODIUM CHLORIDE 0.9 % IV BOLUS
250.0000 mL | Freq: Once | INTRAVENOUS | Status: AC
  Administered 2024-11-21: 250 mL via INTRAVENOUS

## 2024-11-21 MED ORDER — SODIUM ZIRCONIUM CYCLOSILICATE 10 G PO PACK
10.0000 g | PACK | Freq: Once | ORAL | Status: AC
  Administered 2024-11-21: 10 g via ORAL
  Filled 2024-11-21: qty 1

## 2024-11-21 NOTE — TOC Progression Note (Signed)
 Transition of Care [facility]) - Progression Note    Patient Details  Name: [name] MRN: [id number] Date of Birth: 09/13/1941  [facility] Care [facility]) CM/SW Contact  Kassey Laforest, [place], [name] Phone Number: 11/21/2024, 11:06 AM  Clinical Narrative:     CSW met with patient and family at bedside. They continue to review facilities for choice. Per family top choices are [facility], [facility] and [facility].   [facility] confirms bed offer, patient can admit as early as Monday after insurance authorization is received. [facility], [facility] and [facility] contacted to request review. CSw to continue to follow for bed choice.  Bexton Haak, LCSW Transition of Care    Expected Discharge Plan: Skilled Nursing Facility Barriers to Discharge: Continued Medical Work up, English As A Second Language Teacher               Expected Discharge Plan and Services In-house Referral: Clinical Social Work     Living arrangements for the past 2 months: Single Family Home                                       Social Drivers of Health (SDOH) Interventions SDOH Screenings   Food Insecurity: No Food Insecurity (11/11/2024)  Housing: Low Risk (11/11/2024)  Transportation Needs: No Transportation Needs (11/11/2024)  Utilities: Not At Risk (11/11/2024)  Social Connections: Moderately Integrated (11/11/2024)  Tobacco Use: Medium Risk (11/11/2024)    Readmission Risk Interventions     No data to display

## 2024-11-21 NOTE — Progress Notes (Signed)
 Orthopedic Tech Progress Note Patient Details:  [name] 1941/06/23 [id number]  Ortho Devices Type of Ortho Device: Radio broadcast assistant Ortho Device/Splint Location: BLE Ortho Device/Splint Interventions: Application   Post Interventions Patient Tolerated: Well  [name] 11/21/2024, 11:38 AM

## 2024-11-21 NOTE — Progress Notes (Signed)
Blood still not ready for pickup

## 2024-11-21 NOTE — Progress Notes (Addendum)
 [facility] Progress Note Patient: [name] FMW:[id number] DOB: 07-21-1941  DOA: 11/11/2024 DOS: the patient was seen and examined on 11/21/2024  Brief Summary: Patient is a 84 y.o.  male with history of CAD s/p PCI 2008, HFmrEF, HTN, HLD-presented with generalized weakness/fatigue-loss of appetite-found to be hypotensive with AKI-thought to have obstructive uropathy.  Initially admitted to the ICU for pressors-stabilized and subsequently transferred to [facility].   Significant events: 1/14>> admit to ICU 1/16>> received 1 unit PRBC. 1/19>> transferred to [facility]. 1/21>> bilateral PCN performed. 1/22>> 1 unit PRBC given.   Significant studies: 1/14>> CXR: No PNA 1/14>> CT head: No acute intracranial process 1/14>> CT abdomen/pelvis: Severe bilateral hydronephrosis 1/14>> renal ultrasound: Severe bilateral hydronephrosis. 1/15>> echo: EF 55-60% 1/17>> renal ultrasound: Severe bilateral hydronephrosis 1/19>> x-ray KUB: Dilated loops of small bowel-at least consistent with a partial SBO.   Significant microbiology data: 1/14>> COVID/influenza/RSV PCR: Negative 1/14>> blood culture: No growth 1/15>> urine culture: No growth   Consults: PCCM Nephrology Neurology IR   Assessment and plan: AKI Secondary to obstructive uropathy Presents with decreased urine output.  Found to have AKI with hyperkalemia upon admission.  Concern for infection.  Admitted to the ICU for vasopressor. Foley catheter in place to treat hypertension. Urology consulted. Nephrology and IR following. Given that the renal function did not improve and continues to have hydronephrosis IR placed bilateral PCN. Anticipating slow improvement in his renal function. Eventually will require internalization of the stent. Nephrology currently signed off.   Hyperkalemia As well as hypokalemia Metabolic acidosis Secondary to AKI and poor p.o. intake. Monitor daily electrolyte. Maintain K more than 4.  Mag more than  2.   Multifactorial shock Secondary to septic shock due to UTI and hypovolemic shock from poor oral intake BP has stabilized with supportive care Briefly required pressors on initial presentation Blood pressure remains soft. Still on midodrine .  I will increase the dose.  Also giving albumin  and blood.   Complicated UTI Treated with Rocephin -although urine cultures negative-this was done after patient was given antibiotics   Ileus Patient with significant distention of his bowels. No definitive obstruction seen. Actually has a bowel movement and passing gas. Aggressively treating ileus with correction of electrolytes, MiraLAX . Repeat x-ray on 1/23 shows improvement in ileus.   GERD Dyspeptic symptoms are much better Continue PPI As needed simethicone .   Normocytic anemia Acute blood loss anemia Likely secondary to critical illness-has some hematuria after PCN placement. Received 1 PRBC earlier. Another PRBC ordered 01/22 Repeat transfusion 1/24. Holding aspirin  and heparin  for DVT prophylaxis. Follow CBC   History of CAD s/p PCI 2008 No anginal symptoms   Chronic HFmrEF Euvolemic Diuretics on hold   HTN Presented with hypotension-all antihypertensives on hold-resume when able Currently actually on midodrine  still.   DM-2 (A1c 6.4 on 1/15) CBG stable-SSI   Anorexia/failure to thrive syndrome Poor oral intake for the past several months Patient not keen on getting NG tube placed Thankfully slowly improving-continue to encourage oral intake-continue supplements Remains on Remeron  Palliative care following.   Moderate Malnutrition Class I obesity. Body mass index is 30.96 kg/m.  Etiology: acute illness Signs/Symptoms: mild fat depletion, mild muscle depletion Interventions: Refer to RD note for recommendations Placing the patient at high risk for outcome.  Stage II coccyx ulcer, upper back pressure ulcers. Present on admission.  Monitor.  Hematuria. Bilateral PCN showing bloody discharge. IR consulted to reevaluate the PCN. For now recommending flush.  Monitor H&H.   Addendum. Hyperkalemia progressively worsening.  Will treat with temporizing measures. Repeat BMP 4 hours later.    Code Status: Full Code   DVT Prophylaxis: Place and maintain sequential compression device Start: 11/21/24 1110 SCDs Start: 11/11/24 1516  Data review I have Reviewed nursing notes, Vitals, and Lab results. Since last encounter, pertinent lab results CBC and BMP   . I have ordered test including CBC and BMP  . I have discussed pt's care plan and test results with urology Dr. Watt, nephrology  .  Physical exam. Vitals:   11/21/24 0107 11/21/24 0735 11/21/24 1137 11/21/24 1555  BP: (!) 98/41 (!) 86/39 (!) 85/37 (!) 86/41  Pulse: 70     Resp: 14     Temp: 97.7 F (36.5 C) (!) 97.5 F (36.4 C) 98 F (36.7 C) 98.6 F (37 C)  TempSrc: Oral Oral Oral Oral  SpO2: 100%     Weight:      Height:        General: in Mild distress, No Rash Cardiovascular: S1 and S2 Present, No Murmur Respiratory: Good respiratory effort, Bilateral Air entry present. No Crackles, No wheezes Abdomen: Bowel Sound present, No tenderness Extremities: Bilateral lower extremity edema Neuro: Alert and oriented x3, no new focal deficit   Subjective: Denies any acute complaint.  No nausea no vomiting no fever no chills.  Family Communication: Family at bedside.  Disposition Plan: Status is: Inpatient Remains inpatient appropriate because: Monitor for improvement in H&H   Planned Discharge Destination:Skilled nursing facility Diet: Diet Order             Diet regular Room service appropriate? Yes; Fluid consistency: Thin  Diet effective now                   MEDICATIONS: Scheduled Meds:  sodium chloride    Intravenous Once   Chlorhexidine  Gluconate Cloth  6 each Topical Daily   Gerhardt's butt cream  1 Application Topical TID    insulin  aspart  0-9 Units Subcutaneous TID WC   midodrine   15 mg Oral TID WC   mirtazapine   15 mg Oral QHS   multivitamin  1 tablet Oral QHS   pantoprazole   40 mg Oral BID   saccharomyces boulardii  250 mg Oral BID   simethicone   80 mg Oral QID   sodium bicarbonate   650 mg Oral BID   sodium chloride  flush  10-40 mL Intracatheter Q12H   sodium chloride  flush  5 mL Intracatheter Q12H   Continuous Infusions: PRN Meds:.acetaminophen , ondansetron  (ZOFRAN ) IV, polyethylene glycol, sodium chloride  flush  Author: Yetta Blanch, MD  [facility] 11/21/2024  6:42 PM Between 7PM-7AM, please contact night-coverage, check [email or web address] for on call.

## 2024-11-21 NOTE — Plan of Care (Signed)

## 2024-11-21 NOTE — Plan of Care (Signed)
 Patient is progressing towards goals of care.     Problem: Education: Goal: Knowledge of General Education information will improve Description: Including pain rating scale, medication(s)/side effects and non-pharmacologic comfort measures Outcome: Progressing   Problem: Health Behavior/Discharge Planning: Goal: Ability to manage health-related needs will improve Outcome: Progressing   Problem: Clinical Measurements: Goal: Ability to maintain clinical measurements within normal limits will improve Outcome: Progressing Goal: Will remain free from infection Outcome: Progressing Goal: Diagnostic test results will improve Outcome: Progressing Goal: Respiratory complications will improve Outcome: Progressing Goal: Cardiovascular complication will be avoided Outcome: Progressing   Problem: Activity: Goal: Risk for activity intolerance will decrease Outcome: Progressing   Problem: Nutrition: Goal: Adequate nutrition will be maintained Outcome: Progressing   Problem: Coping: Goal: Level of anxiety will decrease Outcome: Progressing   Problem: Elimination: Goal: Will not experience complications related to bowel motility Outcome: Progressing Goal: Will not experience complications related to urinary retention Outcome: Progressing   Problem: Pain Managment: Goal: General experience of comfort will improve and/or be controlled Outcome: Progressing   Problem: Safety: Goal: Ability to remain free from injury will improve Outcome: Progressing   Problem: Skin Integrity: Goal: Risk for impaired skin integrity will decrease Outcome: Progressing   Problem: Education: Goal: Ability to describe self-care measures that may prevent or decrease complications (Diabetes Survival Skills Education) will improve Outcome: Progressing Goal: Individualized Educational Video(s) Outcome: Progressing   Problem: Coping: Goal: Ability to adjust to condition or change in health will  improve Outcome: Progressing   Problem: Fluid Volume: Goal: Ability to maintain a balanced intake and output will improve Outcome: Progressing   Problem: Health Behavior/Discharge Planning: Goal: Ability to identify and utilize available resources and services will improve Outcome: Progressing Goal: Ability to manage health-related needs will improve Outcome: Progressing   Problem: Metabolic: Goal: Ability to maintain appropriate glucose levels will improve Outcome: Progressing   Problem: Nutritional: Goal: Maintenance of adequate nutrition will improve Outcome: Progressing Goal: Progress toward achieving an optimal weight will improve Outcome: Progressing   Problem: Skin Integrity: Goal: Risk for impaired skin integrity will decrease Outcome: Progressing   Problem: Tissue Perfusion: Goal: Adequacy of tissue perfusion will improve Outcome: Progressing

## 2024-11-21 NOTE — Progress Notes (Signed)
 Patient ID: [name], male   DOB: Mar 02, 1941, 84 y.o.   MRN: [id number] [facility] Progress Note   Assessment/ Plan:   1. Acute kidney Injury: Likely secondary to obstruction/obstructive uropathy.  Nonoliguric and continues to have sluggish but consistent renal recovery status post placement of Foley catheter and bilateral percutaneous nephrostomy tubes (by IR).  No acute electrolyte abnormality/indications for dialysis; labs continue to show ongoing renal recovery.  Will begin sodium bicarbonate  for metabolic acidosis and monitor potassium level (mild hyperkalemia, likely iatrogenic). 2.  Urinary tract infection: With progressive BPH/bladder outlet obstruction status post Foley catheter placement.  On empiric antibiotic coverage with ceftriaxone  with urine cultures negative to date.  Appreciate urology input. 3.  Anemia: Likely secondary to chronic illness.  Status post PRBC transfusion 11/13/2024 and without overt blood loss. 4.  Leukocytosis: Improving slowly on empiric ceftriaxone  therapy.  Urine and blood cultures negative to date.  With continued renal recovery and ongoing urology follow-up for additional management of nephrostomy tubes, nephrology will sign off at this time and remain available for questions or concerns.  I will set him up for outpatient nephrology follow-up in 2 to 3 weeks from now when the office opens on Monday 1/26 (he will be contacted with details and I will attempt to place follow-up information on epic chart).    Subjective:   Concerned that Foley catheter was discontinued and he does not have the urge to urinate.  Good output via nephrostomy tubes.   Objective:   BP (!) 86/39 (BP Location: Right Arm)   Pulse 70   Temp (!) 97.5 F (36.4 C) (Oral)   Resp 14   Ht 5' 5 (1.651 m)   Wt 84.4 kg   SpO2 100%   BMI 30.96 kg/m   Intake/Output Summary (Last 24 hours) at 11/21/2024 1001 Last data filed at 11/21/2024 0935 Gross per 24 hour  Intake 20  ml  Output 1060 ml  Net -1040 ml   Weight change:   Physical Exam: Gen: Resting comfortably in bed.  Children at bedside CVS: Pulse regular rhythm, normal rate, S1 and S2 normal Resp: Clear to auscultation, no rales/rhonchi Abd: Soft, flat, nontender, bowel sounds normal.  Nephrostomy tubes in situ; right bag with hematuria Ext: No lower extremity edema  Imaging: DG Abd Portable 1V Result Date: 11/20/2024 EXAM: 1 VIEW XRAY OF THE ABDOMEN 11/20/2024 12:08:00 PM COMPARISON: 11/18/2024 CLINICAL HISTORY: Ileus. ICD10: [id number] Ileus (HCC). FINDINGS: LINES, TUBES AND DEVICES: Bilateral nephrostomy tubes in place. BOWEL: Previously seen small-bowel dilation resolved. Mild gaseous distension of the transverse colon. SOFT TISSUES: No abnormal calcifications. BONES: Degenerative changes of the lumbar spine. No acute fracture. IMPRESSION: 1. Previously seen small-bowel dilation resolved. 2. Mild gaseous distension of the transverse colon. Electronically signed by: Elsie Gravely MD 11/20/2024 04:00 PM EST RP Workstation: [id number]     Labs: BMET Recent Labs  Lab 11/15/24 0416 11/15/24 1659 11/16/24 9246 11/17/24 0156 11/18/24 0302 11/19/24 0339 11/20/24 0322 11/21/24 0551  NA 136 140 139 138 143  143 141 140 135  K 3.8 4.0 3.5 3.0* 3.2*  3.2* 3.5 3.6 5.4*  CL 100 101 102 103 107  107 107 109 109  CO2 21* 16* 18* 20* 22  22 21* 21* 16*  GLUCOSE 86 84 87 154* 96  99 93 105* 89  BUN 122* 118* 119* 121* 114*  115* 109* 98* 90*  CREATININE 6.86* 6.74* 6.84* 6.68* 6.48*  6.52* 6.22* 5.97* 5.51*  CALCIUM  7.6* 8.0*  8.2* 7.6* 7.9*  7.9* 7.6* 7.5* 7.5*  PHOS 5.4*  --  6.3* 5.1* 5.0* 4.4 4.1 2.7   CBC Recent Labs  Lab 11/19/24 0339 11/20/24 0323 11/20/24 1430 11/21/24 0551  WBC 9.8 8.9 8.0 7.7  HGB 7.9* 8.4* 9.0* 7.7*  HCT 24.7* 26.1* 27.8* 23.8*  MCV 102.5* 97.4 97.2 97.5  PLT 200 190 211 188    Medications:     sodium chloride    Intravenous Once   Chlorhexidine  Gluconate  Cloth  6 each Topical Daily   Gerhardt's butt cream  1 Application Topical TID   insulin  aspart  0-9 Units Subcutaneous TID WC   midodrine   15 mg Oral TID WC   mirtazapine   15 mg Oral QHS   multivitamin  1 tablet Oral QHS   pantoprazole   40 mg Oral BID   saccharomyces boulardii  250 mg Oral BID   simethicone   80 mg Oral QID   sodium bicarbonate   650 mg Oral BID   sodium chloride  flush  10-40 mL Intracatheter Q12H   sodium chloride  flush  5 mL Intracatheter Q12H    Gordy Blanch, MD 11/21/2024, 10:01 AM

## 2024-11-22 ENCOUNTER — Inpatient Hospital Stay (HOSPITAL_COMMUNITY)

## 2024-11-22 DIAGNOSIS — N170 Acute kidney failure with tubular necrosis: Secondary | ICD-10-CM | POA: Diagnosis not present

## 2024-11-22 LAB — RENAL FUNCTION PANEL
Albumin: 2 g/dL — ABNORMAL LOW (ref 3.5–5.0)
Albumin: 2.1 g/dL — ABNORMAL LOW (ref 3.5–5.0)
Anion gap: 10 (ref 5–15)
Anion gap: 8 (ref 5–15)
BUN: 86 mg/dL — ABNORMAL HIGH (ref 8–23)
BUN: 86 mg/dL — ABNORMAL HIGH (ref 8–23)
CO2: 15 mmol/L — ABNORMAL LOW (ref 22–32)
CO2: 21 mmol/L — ABNORMAL LOW (ref 22–32)
Calcium: 7.6 mg/dL — ABNORMAL LOW (ref 8.9–10.3)
Calcium: 7.4 mg/dL — ABNORMAL LOW (ref 8.9–10.3)
Chloride: 108 mmol/L (ref 98–111)
Chloride: 107 mmol/L (ref 98–111)
Creatinine, Ser: 5.48 mg/dL — ABNORMAL HIGH (ref 0.61–1.24)
Creatinine, Ser: 5.3 mg/dL — ABNORMAL HIGH (ref 0.61–1.24)
GFR, Estimated: 10 mL/min — ABNORMAL LOW
GFR, Estimated: 10 mL/min — ABNORMAL LOW
Glucose, Bld: 90 mg/dL (ref 70–99)
Glucose, Bld: 116 mg/dL — ABNORMAL HIGH (ref 70–99)
Phosphorus: 3 mg/dL (ref 2.5–4.6)
Phosphorus: 2.4 mg/dL — ABNORMAL LOW (ref 2.5–4.6)
Potassium: 5.7 mmol/L — ABNORMAL HIGH (ref 3.5–5.1)
Potassium: 5.4 mmol/L — ABNORMAL HIGH (ref 3.5–5.1)
Sodium: 134 mmol/L — ABNORMAL LOW (ref 135–145)
Sodium: 135 mmol/L (ref 135–145)

## 2024-11-22 LAB — CBC
HCT: 27.4 % — ABNORMAL LOW (ref 39.0–52.0)
HCT: 28.1 % — ABNORMAL LOW (ref 39.0–52.0)
Hemoglobin: 8.8 g/dL — ABNORMAL LOW (ref 13.0–17.0)
Hemoglobin: 9 g/dL — ABNORMAL LOW (ref 13.0–17.0)
MCH: 31.5 pg (ref 26.0–34.0)
MCH: 31.7 pg (ref 26.0–34.0)
MCHC: 32.1 g/dL (ref 30.0–36.0)
MCHC: 32 g/dL (ref 30.0–36.0)
MCV: 98.2 fL (ref 80.0–100.0)
MCV: 98.9 fL (ref 80.0–100.0)
Platelets: 176 10*3/uL (ref 150–400)
Platelets: 209 10*3/uL (ref 150–400)
RBC: 2.79 MIL/uL — ABNORMAL LOW (ref 4.22–5.81)
RBC: 2.84 MIL/uL — ABNORMAL LOW (ref 4.22–5.81)
RDW: 18.6 % — ABNORMAL HIGH (ref 11.5–15.5)
RDW: 18.5 % — ABNORMAL HIGH (ref 11.5–15.5)
WBC: 6.9 10*3/uL (ref 4.0–10.5)
WBC: 8.3 10*3/uL (ref 4.0–10.5)
nRBC: 0 % (ref 0.0–0.2)
nRBC: 0 % (ref 0.0–0.2)

## 2024-11-22 LAB — BASIC METABOLIC PANEL WITH GFR
Anion gap: 8 (ref 5–15)
BUN: 91 mg/dL — ABNORMAL HIGH (ref 8–23)
CO2: 21 mmol/L — ABNORMAL LOW (ref 22–32)
Calcium: 7.2 mg/dL — ABNORMAL LOW (ref 8.9–10.3)
Chloride: 107 mmol/L (ref 98–111)
Creatinine, Ser: 5.21 mg/dL — ABNORMAL HIGH (ref 0.61–1.24)
GFR, Estimated: 10 mL/min — ABNORMAL LOW
Glucose, Bld: 121 mg/dL — ABNORMAL HIGH (ref 70–99)
Potassium: 5.4 mmol/L — ABNORMAL HIGH (ref 3.5–5.1)
Sodium: 135 mmol/L (ref 135–145)

## 2024-11-22 LAB — GLUCOSE, CAPILLARY
Glucose-Capillary: 90 mg/dL (ref 70–99)
Glucose-Capillary: 108 mg/dL — ABNORMAL HIGH (ref 70–99)
Glucose-Capillary: 125 mg/dL — ABNORMAL HIGH (ref 70–99)
Glucose-Capillary: 116 mg/dL — ABNORMAL HIGH (ref 70–99)

## 2024-11-22 LAB — HEPATIC FUNCTION PANEL
ALT: 31 U/L (ref 0–44)
AST: 30 U/L (ref 15–41)
Albumin: 2.1 g/dL — ABNORMAL LOW (ref 3.5–5.0)
Alkaline Phosphatase: 71 U/L (ref 38–126)
Bilirubin, Direct: 0.1 mg/dL (ref 0.0–0.2)
Indirect Bilirubin: 0.2 mg/dL — ABNORMAL LOW (ref 0.3–0.9)
Total Bilirubin: 0.3 mg/dL (ref 0.0–1.2)
Total Protein: 4.3 g/dL — ABNORMAL LOW (ref 6.5–8.1)

## 2024-11-22 LAB — LACTIC ACID, PLASMA: Lactic Acid, Venous: 2.2 mmol/L (ref 0.5–1.9)

## 2024-11-22 LAB — MAGNESIUM: Magnesium: 1.6 mg/dL — ABNORMAL LOW (ref 1.7–2.4)

## 2024-11-22 LAB — T4, FREE: Free T4: 1.07 ng/dL (ref 0.80–2.00)

## 2024-11-22 LAB — TSH: TSH: 4.52 u[IU]/mL — ABNORMAL HIGH (ref 0.350–4.500)

## 2024-11-22 LAB — CORTISOL: Cortisol, Plasma: 11.1 ug/dL

## 2024-11-22 MED ORDER — ALBUMIN HUMAN 5 % IV SOLN
25.0000 g | Freq: Once | INTRAVENOUS | Status: DC
  Filled 2024-11-22: qty 500

## 2024-11-22 MED ORDER — ALBUMIN HUMAN 5 % IV SOLN
25.0000 g | Freq: Four times a day (QID) | INTRAVENOUS | Status: AC
  Administered 2024-11-22 (×2): 25 g via INTRAVENOUS
  Filled 2024-11-22 (×2): qty 500

## 2024-11-22 MED ORDER — SODIUM BICARBONATE 650 MG PO TABS
1300.0000 mg | ORAL_TABLET | Freq: Two times a day (BID) | ORAL | Status: DC
  Administered 2024-11-22 – 2024-12-01 (×19): 1300 mg via ORAL
  Filled 2024-11-22 (×20): qty 2

## 2024-11-22 MED ORDER — MAGNESIUM SULFATE 2 GM/50ML IV SOLN
2.0000 g | Freq: Once | INTRAVENOUS | Status: AC
  Administered 2024-11-22: 2 g via INTRAVENOUS
  Filled 2024-11-22: qty 50

## 2024-11-22 MED ORDER — SODIUM CHLORIDE 0.9 % IV BOLUS
250.0000 mL | Freq: Once | INTRAVENOUS | Status: AC
  Administered 2024-11-22: 250 mL via INTRAVENOUS

## 2024-11-22 MED ORDER — PROSOURCE TF20 ENFIT COMPATIBL EN LIQD
60.0000 mL | Freq: Every day | ENTERAL | Status: DC
  Administered 2024-11-22 – 2024-11-23 (×2): 60 mL
  Filled 2024-11-22 (×3): qty 60

## 2024-11-22 MED ORDER — NEPRO/CARBSTEADY PO LIQD
237.0000 mL | Freq: Three times a day (TID) | ORAL | Status: DC
  Administered 2024-11-22 – 2024-11-26 (×7): 237 mL via ORAL

## 2024-11-22 MED ORDER — SODIUM ZIRCONIUM CYCLOSILICATE 10 G PO PACK
10.0000 g | PACK | Freq: Two times a day (BID) | ORAL | Status: AC
  Administered 2024-11-22 (×2): 10 g via ORAL
  Filled 2024-11-22 (×2): qty 1

## 2024-11-22 NOTE — Plan of Care (Signed)

## 2024-11-22 NOTE — Plan of Care (Signed)
 Patient is progressing towards goals of care.     Problem: Education: Goal: Knowledge of General Education information will improve Description: Including pain rating scale, medication(s)/side effects and non-pharmacologic comfort measures Outcome: Progressing   Problem: Health Behavior/Discharge Planning: Goal: Ability to manage health-related needs will improve Outcome: Progressing   Problem: Clinical Measurements: Goal: Ability to maintain clinical measurements within normal limits will improve Outcome: Progressing Goal: Will remain free from infection Outcome: Progressing Goal: Diagnostic test results will improve Outcome: Progressing Goal: Respiratory complications will improve Outcome: Progressing Goal: Cardiovascular complication will be avoided Outcome: Progressing   Problem: Activity: Goal: Risk for activity intolerance will decrease Outcome: Progressing   Problem: Nutrition: Goal: Adequate nutrition will be maintained Outcome: Progressing   Problem: Coping: Goal: Level of anxiety will decrease Outcome: Progressing   Problem: Elimination: Goal: Will not experience complications related to bowel motility Outcome: Progressing Goal: Will not experience complications related to urinary retention Outcome: Progressing   Problem: Pain Managment: Goal: General experience of comfort will improve and/or be controlled Outcome: Progressing   Problem: Safety: Goal: Ability to remain free from injury will improve Outcome: Progressing   Problem: Skin Integrity: Goal: Risk for impaired skin integrity will decrease Outcome: Progressing   Problem: Education: Goal: Ability to describe self-care measures that may prevent or decrease complications (Diabetes Survival Skills Education) will improve Outcome: Progressing Goal: Individualized Educational Video(s) Outcome: Progressing   Problem: Coping: Goal: Ability to adjust to condition or change in health will  improve Outcome: Progressing   Problem: Fluid Volume: Goal: Ability to maintain a balanced intake and output will improve Outcome: Progressing   Problem: Health Behavior/Discharge Planning: Goal: Ability to identify and utilize available resources and services will improve Outcome: Progressing Goal: Ability to manage health-related needs will improve Outcome: Progressing   Problem: Metabolic: Goal: Ability to maintain appropriate glucose levels will improve Outcome: Progressing   Problem: Nutritional: Goal: Maintenance of adequate nutrition will improve Outcome: Progressing Goal: Progress toward achieving an optimal weight will improve Outcome: Progressing   Problem: Skin Integrity: Goal: Risk for impaired skin integrity will decrease Outcome: Progressing   Problem: Tissue Perfusion: Goal: Adequacy of tissue perfusion will improve Outcome: Progressing

## 2024-11-22 NOTE — Progress Notes (Signed)
 [facility] Progress Note Patient: [name] FMW:[id number] DOB: [birth year]  DOA: 11/11/2024 DOS: the patient was seen and examined on 11/22/2024  Brief Hospital Course: Patient is a 84 y.o.  male with history of CAD s/p PCI 2008, HFmrEF, HTN, HLD-presented with generalized weakness/fatigue-loss of appetite-found to be hypotensive with AKI-thought to have obstructive uropathy.  Initially admitted to the ICU for pressors-stabilized and subsequently transferred to [facility].   Significant events: 1/14>> admit to ICU 1/16>> received 1 unit PRBC. 1/19>> transferred to [facility]. 1/21>> bilateral PCN performed. 1/22>> 1 unit PRBC given. 1/24>> 1 more PRBC given 1/25>> US  renal mild bilateral hydronephrosis mild pleural effusion.  Significant studies: 1/14>> CXR: No PNA 1/14>> CT head: No acute intracranial process 1/14>> CT abdomen/pelvis: Severe bilateral hydronephrosis 1/14>> renal ultrasound: Severe bilateral hydronephrosis. 1/15>> echo: EF 55-60% 1/17>> renal ultrasound: Severe bilateral hydronephrosis 1/19>> x-ray KUB: Dilated loops of small bowel-at least consistent with a partial SBO.   Significant microbiology data: 1/14>> COVID/influenza/RSV PCR: Negative 1/14>> blood culture: No growth 1/15>> urine culture: No growth   Consults: PCCM Nephrology Neurology IR   Assessment and plan: AKI Secondary to obstructive uropathy Presents with decreased urine output.  Found to have AKI with hyperkalemia upon admission.  Concern for infection.  Admitted to the ICU for vasopressor. Foley catheter in place to treat hypertension. Urology consulted. Nephrology and IR following. Given that the renal function did not improve and continues to have hydronephrosis IR placed bilateral PCN. Anticipating slow improvement in his renal function. Eventually will require internalization of the stent.  US  renal on 1/25 shows mild hydronephrosis.  Decompressed bladder. Nephrology currently signed  off.   Hyperkalemia As well as hypokalemia Metabolic acidosis Secondary to AKI and poor p.o. intake. Monitor daily electrolyte. Now potassium is elevated and therefore patient is receiving low Lokelma  and other therapy to lower it.   Multifactorial shock Secondary to septic shock due to UTI and hypovolemic shock from poor oral intake BP has stabilized with supportive care Briefly required pressors on initial presentation Blood pressure remains soft. Still on midodrine .  I will increase the dose.  Also giving albumin  and blood.   Complicated UTI Treated with Rocephin -although urine cultures negative-this was done after patient was given antibiotics   Ileus resolved. Patient with significant distention of his bowels. No definitive obstruction seen. Actually has a bowel movement and passing gas. Repeat x-ray on 1/23 shows improvement in ileus.   GERD Dyspeptic symptoms are much better Continue PPI As needed simethicone .   Normocytic anemia Acute blood loss anemia Likely secondary to critical illness-has some hematuria after PCN placement. Received 1 PRBC earlier. Another PRBC ordered 01/22 Repeat transfusion 1/24. Holding aspirin  and heparin  for DVT prophylaxis. Follow CBC   History of CAD s/p PCI 2008 No anginal symptoms   Chronic HFmrEF Euvolemic Diuretics on hold   HTN Presented with hypotension-all antihypertensives on hold-resume when able Currently actually on midodrine  still.   DM-2 (A1c 6.4 on 1/15) CBG stable-SSI   Anorexia/failure to thrive syndrome Poor oral intake for the past several months Patient not keen on getting NG tube placed Thankfully slowly improving-continue to encourage oral intake-continue supplements Remains on Remeron  Palliative care following.   Moderate Malnutrition Class I obesity. Body mass index is 30.96 kg/m.  Etiology: acute illness Signs/Symptoms: mild fat depletion, mild muscle depletion Interventions: Refer to RD  note for recommendations Placing the patient at high risk for outcome.  Stage II coccyx ulcer, upper back pressure ulcers. Present on admission. Monitor.  Hematuria. Bilateral PCN showing bloody discharge. IR consulted to reevaluate the PCN. For now recommending flush.  Left PCN has some blood but right PCN is now clear. Monitor H&H.  Hypoalbuminemia with third spacing. Albumin  level is 2. Has lower extremity swelling.  Bilateral pleural effusion. Suspect this is third spacing related to hypoalbuminemia. Will eventually need to resume diuresis.  Goals of care conversation. 1/25 extensive discussion with patient at bedside.  Daughter and son at bedside as well. Patient feels that the way he is feeling right now he would like to go through an attempt of resuscitation understanding that there is a very high risk of his course after resuscitation may not be without any significant complications. Patient does not want to be on lifelong hemodialysis.  Patient also knows that he would likely be not a candidate for lifelong hemodialysis. Patient would like to attempt short-term ICU stay should he need 1.   Subjective: Feels better.  No nausea no vomiting no fever no chills.  Had a BM yesterday as well.  No blood in the stool.  Physical Exam: General: in Mild distress, No Rash Cardiovascular: S1 and S2 Present, No Murmur Respiratory: Good respiratory effort, Bilateral Air entry present.  Basal crackles, No wheezes Abdomen: Bowel Sound present, No tenderness.  Right PCN with clear urine.  Left PCN with blood mixed urine. Extremities: Improving bilateral edema Neuro: Alert and oriented x3, no new focal deficit   Data Reviewed: I have Reviewed nursing notes, Vitals, and Lab results. Since last encounter, pertinent lab results CBC and BMP   . I have ordered test including CBC and BMP  . I have discussed pt's care plan and test results with nephrology  .   Disposition: Status is:  Inpatient Remains inpatient appropriate because: Monitor for improvement in renal function  Place and maintain sequential compression device Start: 11/21/24 1110 SCDs Start: 11/11/24 1516   Family Communication: Family at bedside Level of care: Progressive   Vitals:   11/22/24 1158 11/22/24 1200 11/22/24 1600 11/22/24 1843  BP:    (!) 108/49  Pulse: 95     Resp: 15   20  Temp:  97.9 F (36.6 C) 98.2 F (36.8 C)   TempSrc:  Axillary Axillary Oral  SpO2: 100%     Weight:      Height:         Author: Yetta Blanch, MD 11/22/2024 8:24 PM  Please look on [email or web address] to find out who is on call.

## 2024-11-23 ENCOUNTER — Inpatient Hospital Stay (HOSPITAL_COMMUNITY)

## 2024-11-23 DIAGNOSIS — R531 Weakness: Secondary | ICD-10-CM | POA: Diagnosis not present

## 2024-11-23 DIAGNOSIS — R627 Adult failure to thrive: Secondary | ICD-10-CM

## 2024-11-23 DIAGNOSIS — N170 Acute kidney failure with tubular necrosis: Secondary | ICD-10-CM | POA: Diagnosis not present

## 2024-11-23 DIAGNOSIS — R609 Edema, unspecified: Secondary | ICD-10-CM | POA: Diagnosis not present

## 2024-11-23 DIAGNOSIS — Z7189 Other specified counseling: Secondary | ICD-10-CM | POA: Diagnosis not present

## 2024-11-23 DIAGNOSIS — Z515 Encounter for palliative care: Secondary | ICD-10-CM

## 2024-11-23 LAB — CBC
HCT: 24.4 % — ABNORMAL LOW (ref 39.0–52.0)
HCT: 24.8 % — ABNORMAL LOW (ref 39.0–52.0)
Hemoglobin: 7.8 g/dL — ABNORMAL LOW (ref 13.0–17.0)
Hemoglobin: 8.2 g/dL — ABNORMAL LOW (ref 13.0–17.0)
MCH: 31.5 pg (ref 26.0–34.0)
MCH: 32.2 pg (ref 26.0–34.0)
MCHC: 32 g/dL (ref 30.0–36.0)
MCHC: 33.1 g/dL (ref 30.0–36.0)
MCV: 98.4 fL (ref 80.0–100.0)
MCV: 97.3 fL (ref 80.0–100.0)
Platelets: 176 10*3/uL (ref 150–400)
Platelets: 210 10*3/uL (ref 150–400)
RBC: 2.48 MIL/uL — ABNORMAL LOW (ref 4.22–5.81)
RBC: 2.55 MIL/uL — ABNORMAL LOW (ref 4.22–5.81)
RDW: 18.5 % — ABNORMAL HIGH (ref 11.5–15.5)
RDW: 18 % — ABNORMAL HIGH (ref 11.5–15.5)
WBC: 7.7 10*3/uL (ref 4.0–10.5)
WBC: 8.9 10*3/uL (ref 4.0–10.5)
nRBC: 0 % (ref 0.0–0.2)
nRBC: 0 % (ref 0.0–0.2)

## 2024-11-23 LAB — BPAM RBC
Blood Product Expiration Date: 202602122359
Blood Product Expiration Date: 202602122359
ISSUE DATE / TIME: 202601221956
ISSUE DATE / TIME: 202601250129
Unit Type and Rh: 202602122359
Unit Type and Rh: 600
Unit Type and Rh: 600

## 2024-11-23 LAB — GLUCOSE, CAPILLARY
Glucose-Capillary: 98 mg/dL (ref 70–99)
Glucose-Capillary: 111 mg/dL — ABNORMAL HIGH (ref 70–99)
Glucose-Capillary: 116 mg/dL — ABNORMAL HIGH (ref 70–99)
Glucose-Capillary: 117 mg/dL — ABNORMAL HIGH (ref 70–99)

## 2024-11-23 LAB — RENAL FUNCTION PANEL
Albumin: 2.6 g/dL — ABNORMAL LOW (ref 3.5–5.0)
Anion gap: 10 (ref 5–15)
BUN: 83 mg/dL — ABNORMAL HIGH (ref 8–23)
CO2: 19 mmol/L — ABNORMAL LOW (ref 22–32)
Calcium: 7.3 mg/dL — ABNORMAL LOW (ref 8.9–10.3)
Chloride: 106 mmol/L (ref 98–111)
Creatinine, Ser: 5.2 mg/dL — ABNORMAL HIGH (ref 0.61–1.24)
GFR, Estimated: 10 mL/min — ABNORMAL LOW
Glucose, Bld: 122 mg/dL — ABNORMAL HIGH (ref 70–99)
Phosphorus: 2.3 mg/dL — ABNORMAL LOW (ref 2.5–4.6)
Potassium: 4.8 mmol/L (ref 3.5–5.1)
Sodium: 135 mmol/L (ref 135–145)

## 2024-11-23 LAB — TYPE AND SCREEN
ABO/RH(D): A NEG
Antibody Screen: NEGATIVE
Unit division: 0
Unit division: 0

## 2024-11-23 LAB — MAGNESIUM: Magnesium: 1.8 mg/dL (ref 1.7–2.4)

## 2024-11-23 NOTE — TOC Progression Note (Signed)
 Transition of Care [facility]) - Progression Note    Patient Details  Name: [name] MRN: [id number] Date of Birth: December 09, 1940  Transition of Care [facility]) CM/SW Contact  Inocente GORMAN Kindle, LCSW Phone Number: 11/23/2024, 2:46 PM  Clinical Narrative:    CSW continuing to follow for medical stability.    Expected Discharge Plan: Skilled Nursing Facility Barriers to Discharge: Continued Medical Work up, English As A Second Language Teacher               Expected Discharge Plan and Services In-house Referral: Clinical Social Work     Living arrangements for the past 2 months: Single Family Home                                       Social Drivers of Health (SDOH) Interventions SDOH Screenings   Food Insecurity: No Food Insecurity (11/11/2024)  Housing: Low Risk (11/11/2024)  Transportation Needs: No Transportation Needs (11/11/2024)  Utilities: Not At Risk (11/11/2024)  Social Connections: Moderately Integrated (11/11/2024)  Tobacco Use: Medium Risk (11/11/2024)    Readmission Risk Interventions     No data to display

## 2024-11-23 NOTE — Progress Notes (Signed)
 Orthopedic Tech Progress Note Patient Details:  RHYSE LOUX 08-14-1941 [id number]  Ortho Devices Type of Ortho Device: Ace wrap, Unna boot Ortho Device/Splint Location: BLE Ortho Device/Splint Interventions: Ordered, Application, Adjustment   Post Interventions Patient Tolerated: Well Instructions Provided: Care of device  Delanna LITTIE Pac 11/23/2024, 2:45 PM

## 2024-11-23 NOTE — Progress Notes (Signed)
 [facility] Progress Note Patient: [name] FMW:[id number] DOB: [birth year]  DOA: 11/11/2024 DOS: the patient was seen and examined on 11/23/2024  Brief Summary: Patient is a 84 y.o.  male with history of CAD s/p PCI 2008, HFmrEF, HTN, HLD-presented with generalized weakness/fatigue-loss of appetite-found to be hypotensive with AKI-thought to have obstructive uropathy.  Initially admitted to the ICU for pressors-stabilized and subsequently transferred to [facility].   Significant events: 1/14>> admit to ICU 1/16>> received 1 unit PRBC. 1/19>> transferred to [facility]. 1/21>> bilateral PCN performed. 1/22>> 1 unit PRBC given. 1/24>> 1 more PRBC given 1/25>> US  renal mild bilateral hydronephrosis mild pleural effusion.  Significant studies: 1/14>> CXR: No PNA 1/14>> CT head: No acute intracranial process 1/14>> CT abdomen/pelvis: Severe bilateral hydronephrosis 1/14>> renal ultrasound: Severe bilateral hydronephrosis. 1/15>> echo: EF 55-60% 1/17>> renal ultrasound: Severe bilateral hydronephrosis 1/19>> x-ray KUB: Dilated loops of small bowel-at least consistent with a partial SBO.   Significant microbiology data: 1/14>> COVID/influenza/RSV PCR: Negative 1/14>> blood culture: No growth 1/15>> urine culture: No growth   Consults: PCCM Nephrology Neurology IR   Assessment and plan: AKI Secondary to obstructive uropathy Presents with decreased urine output.  Found to have AKI with hyperkalemia upon admission.  Concern for infection.  Admitted to the ICU for vasopressor. Foley catheter in place to treat hypertension. Urology consulted. Nephrology and IR following. Given that the renal function did not improve and continues to have hydronephrosis IR placed bilateral PCN. Anticipating slow improvement in his renal function. Eventually will require internalization of the stent.  US  renal on 1/25 shows mild hydronephrosis.  Decompressed bladder. Nephrology currently signed off.    Hyperkalemia As well as hypokalemia Metabolic acidosis Secondary to AKI and poor p.o. intake. Monitor daily electrolyte. Now potassium is elevated and therefore patient is receiving low Lokelma  and other therapy to lower it.   Multifactorial shock Secondary to septic shock due to UTI and hypovolemic shock from poor oral intake BP has stabilized with supportive care Briefly required pressors on initial presentation Blood pressure remains soft. Still on midodrine .  I will increase the dose.  Also giving albumin  and blood.   Complicated UTI Treated with Rocephin -although urine cultures negative-this was done after patient was given antibiotics   Ileus resolved. Patient with significant distention of his bowels. No definitive obstruction seen. Actually has a bowel movement and passing gas. Repeat x-ray on 1/23 shows improvement in ileus.   GERD Dyspeptic symptoms are much better Continue PPI As needed simethicone .   Normocytic anemia Acute blood loss anemia Likely secondary to critical illness-has some hematuria after PCN placement. Received 1 PRBC earlier. Another PRBC ordered 01/22 Repeat transfusion 1/24. Holding aspirin  and heparin  for DVT prophylaxis. Follow CBC   History of CAD s/p PCI 2008 No anginal symptoms   Chronic HFmrEF Euvolemic Diuretics on hold   HTN Presented with hypotension all antihypertensives on hold. In fact actually on midodrine .   Type of diabetes mellitus, well-controlled without long-term insulin  use with CKD. Currently on sliding scale insulin .   Anorexia/failure to thrive syndrome Poor oral intake for the past several months Patient not keen on getting NG tube placed Thankfully slowly improving-continue to encourage oral intake-continue supplements Remains on Remeron  Palliative care following.   Moderate Malnutrition Class I obesity. Body mass index is 30.96 kg/m.  Etiology: acute illness Signs/Symptoms: mild fat depletion,  mild muscle depletion Interventions: Refer to RD note for recommendations Placing the patient at high risk for outcome.  Stage II coccyx ulcer, upper back  pressure ulcers. Present on admission. Monitor.  Hematuria. Bilateral PCN had bloody discharge.  Now resolved. IR consulted to reevaluate the PCN.  Recommended continue irrigation. Monitor H&H.  Hypoalbuminemia with third spacing. Albumin  level is 2. Has lower extremity swelling.  Bilateral pleural effusion. Suspect this is third spacing related to hypoalbuminemia. Will eventually need to resume diuresis.  Goals of care conversation. 1/25 extensive discussion with patient at bedside.  Daughter and son at bedside as well. Patient feels that the way he is feeling right now he would like to go through an attempt of resuscitation understanding that there is a very high risk of his course after resuscitation may not be without any significant complications. Patient does not want to be on lifelong hemodialysis.  Patient also knows that he would likely be not a candidate for lifelong hemodialysis. Patient would like to attempt short-term ICU stay should he need 1.  DVT Prophylaxis: Place and maintain sequential compression device Start: 11/21/24 1110 SCDs Start: 11/11/24 1516   Data review I have Reviewed nursing notes, Vitals, and Lab results. Since last encounter, pertinent lab results CBC and BMP   . I have ordered test including CBC and BMP  .  Recheck CBC at 5 PM given hemoglobin drop. Lower EXTR Doppler is negative for DVT.  Physical exam. Vitals:   11/23/24 0800 11/23/24 1201 11/23/24 1202 11/23/24 1606  BP: (!) 101/42 (!) 99/51  (!) 113/53  Pulse: 81 89 92 87  Resp: 14 (!) 21 18 (!) 21  Temp: 97.8 F (36.6 C) 98.6 F (37 C)  98.4 F (36.9 C)  TempSrc: Oral Oral  Oral  SpO2: 98% 98% 97% 97%  Weight:      Height:       General: in Mild distress, No Rash Cardiovascular: S1 and S2 Present, aortic systolic  Murmur Respiratory: Good respiratory effort, Bilateral Air entry present. No Crackles, No wheezes Abdomen: Bowel Sound present, No tenderness Extremities: Bilateral lower extremity edema Neuro: Alert and oriented x3, no new focal deficit  Subjective: Denies any acute complaint.  No nausea no vomiting no fever no chills.  No chest pain.  Family Communication: Family at bedside.  Disposition Plan: Status is: Inpatient Remains inpatient appropriate because: Monitor for improvement in renal function and hemoglobin when blood pressure.   Planned Discharge Destination: SNF Diet: Diet Order             Diet regular Room service appropriate? Yes; Fluid consistency: Thin  Diet effective now                   MEDICATIONS: Scheduled Meds:  Chlorhexidine  Gluconate Cloth  6 each Topical Daily   feeding supplement (NEPRO CARB STEADY)  237 mL Oral TID BM   feeding supplement (PROSource TF20)  60 mL Per Tube Daily   Gerhardt's butt cream  1 Application Topical TID   insulin  aspart  0-9 Units Subcutaneous TID WC   midodrine   15 mg Oral TID WC   mirtazapine   15 mg Oral QHS   multivitamin  1 tablet Oral QHS   pantoprazole   40 mg Oral BID   saccharomyces boulardii  250 mg Oral BID   simethicone   80 mg Oral QID   sodium bicarbonate   1,300 mg Oral BID   sodium chloride  flush  10-40 mL Intracatheter Q12H   sodium chloride  flush  5 mL Intracatheter Q12H   Continuous Infusions: PRN Meds:.acetaminophen , ondansetron  (ZOFRAN ) IV, polyethylene glycol, sodium chloride  flush  Author: Yetta Blanch, MD  [facility] 11/23/2024  6:33 PM Between 7PM-7AM, please contact night-coverage, check [email or web address] for on call.

## 2024-11-23 NOTE — Progress Notes (Signed)
 Patient ID: [name], male   DOB: 1941/01/19, 84 y.o.   MRN: [id number]    Progress Note from the Palliative Medicine Team at [facility]   Patient Name: [name]        Date: 11/23/2024 DOB: 1941/03/10  Age: 84 y.o. MRN#: [id number] Attending Physician: Tobie Yetta CHRISTELLA, MD Primary Care Physician: Faythe Purchase, MD Admit Date: 11/11/2024   Reason for Consultation/Follow-up   Establishing Goals of Care   HPI/ Brief Hospital Review  84 y.o. male  with past medical history of hypertension, hyperlipidemia, non-STEMI status post DES to LAD in 2008, ischemic cardiomyopathy, heart failure, renal insufficiency admitted on 11/11/2024 with generalized complaints of weakness, fatigue lack of energy, decreased appetite and poor p.o. intake since December 2025   Admitted for treatment and stabilization.   Workup significant for AKI thought to have obstructive uropathy.  Nephrology/urology following and await further recommendations.   Patient and his family face ongoing decisions regarding treatment options, advanced directives and anticipatory care needs.   Subjective  Extensive chart review has been completed prior to meeting with patient/family  including labs, vital signs, imaging, progress/consult notes, orders, medications and available advance directive documents.   This NP assessed patient at the bedside as a follow up for palliative medicine needs and emotional support.    Patient is alert and oriented, no complaints of pain or discomfort.  He reports sleeping well and eating better  Patient's son and daughter are both at bedside.  TOC is working with patient and family for next steps in transition of care, skilled nursing facility for short-term rehab.  Ongoing education regarding current medical situation specific to his continued overall failure to thrive..  Patient has had significant physical and functional decline over the past many months, along with poor appetite and  weight loss Concern that this may continue, .adult failure to thrive.  Education offered today regarding  the importance of continued conversation with family and the medical providers regarding overall plan of care and treatment options,  ensuring decisions are within the context of the patients values and GOCs.  Again today we reviewed MOST form, recommended the importance of completing this form prior to discharge.  Stressed importance of consideration and documentation of advance care planning documents  Questions and concerns addressed   Discussed with primary team and nursing staff  Time: 50   minutes  Detailed review of medical records ( labs, imaging, vital signs), medically appropriate exam ( MS, skin, cardiac,  resp)   discussed with treatment team, counseling and education to patient, family, staff, documenting clinical information, medication management, coordination of care    Ronal Plants NP  Palliative Medicine Team Team Phone # [phone] Pager [phone]

## 2024-11-23 NOTE — Progress Notes (Addendum)
 Physical Therapy Treatment Patient Details Name: [name] MRN: [id number] DOB: [birth year] Today's Date: 11/23/2024   History of Present Illness 84 y.o. male admitted 11/11/24 with weakness, fatigue, decreased appetite, decreased urine output. Workup for severe AKI, septic shock secondary to UTI, severe bilateral hydronephrosis. Transfer out of ICU 1/18. Imaging 1/19 consistent with partial SBO. PMH includes HTN, HLD, DM, NSTEMI, ischemic cardiomyopathy, HF, CVA, heart murmur.    PT Comments  Patient progressing slowly though walking slightly further and without noted knee buckling.  He was timid at first to move slowly and seemed to gain confidence with distance though declined second walk.  Family in the room and supportive.  BP measures as below and in General Comments.  Feel he will benefit from continued skilled PT in the acute setting and from post-acute inpatient rehab (<3 hours/day) at d/c.  Orthostatic VS for the past 24 hrs (Last 3 readings):  BP- Lying BP- Sitting BP- Standing at 0 minutes  11/23/24 1700 99/54 104/53 99/67      If plan is discharge home, recommend the following: A little help with walking and/or transfers;A lot of help with bathing/dressing/bathroom;Assistance with cooking/housework;Direct supervision/assist for medications management;Direct supervision/assist for financial management;Assist for transportation;Help with stairs or ramp for entrance   Can travel by private vehicle        Equipment Recommendations  Rolling walker (2 wheels)    Recommendations for Other Services       Precautions / Restrictions Precautions Precautions: Fall Recall of Precautions/Restrictions: Intact Precaution/Restrictions Comments: watch BP; B nephrostomy tubes     Mobility  Bed Mobility Overal bed mobility: Needs Assistance Bed Mobility: Supine to Sit     Supine to sit: Min assist     General bed mobility comments: to lift trunk    Transfers Overall transfer  level: Needs assistance Equipment used: Rolling walker (2 wheels) Transfers: Sit to/from Stand Sit to Stand: Min assist           General transfer comment: assist from EOB to stand, assist for balance; to sit to recliner cues for positioning and hand placement    Ambulation/Gait Ambulation/Gait assistance: Min assist, Contact guard assist Gait Distance (Feet): 20 Feet Assistive device: Rolling walker (2 wheels) Gait Pattern/deviations: Step-to pattern, Decreased stride length, Trunk flexed       General Gait Details: no knee buckling though pt timid and slow to move at first expecting knee to buckle, walked to door in room then to recliner, refused second walk opting for LE therex   Stairs             Wheelchair Mobility     Tilt Bed    Modified Rankin (Stroke Patients Only)       Balance Overall balance assessment: Needs assistance   Sitting balance-Leahy Scale: Good     Standing balance support: Bilateral upper extremity supported Standing balance-Leahy Scale: Poor Standing balance comment: heavy UE reliance in standing                            Communication Communication Communication: Impaired Factors Affecting Communication: Hearing impaired  Cognition Arousal: Alert Behavior During Therapy: WFL for tasks assessed/performed                             Following commands: Intact      Cueing Cueing Techniques: Verbal cues  Exercises General Exercises - Lower Extremity Long  Arc Quad: Strengthening, Both, 10 reps, Seated (5 sec hold) Hip ABduction/ADduction: Strengthening, Both, 10 reps, Seated (adductor sets 5 sec hold) Hip Flexion/Marching: Strengthening, Both, 20 reps, Seated    General Comments General comments (skin integrity, edema, etc.): BP measurements, see flowsheet for initial, after ambulation 118/47 (66);  when in recliner, legs up 111/50 (65)      Pertinent Vitals/Pain Pain Assessment Pain Assessment:  No/denies pain    Home Living                          Prior Function            PT Goals (current goals can now be found in the care plan section) Progress towards PT goals: Progressing toward goals    Frequency    Min 3X/week      PT Plan      Co-evaluation              AM-PAC PT 6 Clicks Mobility   Outcome Measure  Help needed turning from your back to your side while in a flat bed without using bedrails?: A Little Help needed moving from lying on your back to sitting on the side of a flat bed without using bedrails?: A Little Help needed moving to and from a bed to a chair (including a wheelchair)?: A Little Help needed standing up from a chair using your arms (e.g., wheelchair or bedside chair)?: A Little Help needed to walk in hospital room?: A Little Help needed climbing 3-5 steps with a railing? : Total 6 Click Score: 16    End of Session Equipment Utilized During Treatment: Gait belt Activity Tolerance: Patient tolerated treatment well Patient left: in chair;with call bell/phone within reach;with family/visitor present   PT Visit Diagnosis: Unsteadiness on feet (R26.81);Muscle weakness (generalized) (M62.81)     Time: 8752-8684 PT Time Calculation (min) (ACUTE ONLY): 28 min  Charges:    $Gait Training: 8-22 mins $Therapeutic Activity: 8-22 mins PT General Charges $$ ACUTE PT VISIT: 1 Visit                     Micheline Klein, PT Acute Rehabilitation Services [phone] 11/23/2024    [name] 11/23/2024, 5:38 PM

## 2024-11-23 NOTE — Progress Notes (Signed)
 Bilateral lower extremity venous duplex has been completed.  Results can be found in chart review under CV Proc.  11/23/2024 5:48 PM  Monzerrat Wellen Elden Appl, RVT.

## 2024-11-23 NOTE — Plan of Care (Signed)

## 2024-11-23 NOTE — Plan of Care (Signed)
" °  Problem: Education: Goal: Knowledge of General Education information will improve Description: Including pain rating scale, medication(s)/side effects and non-pharmacologic comfort measures 11/23/2024 0711 by Elnor Zachary CROME, RN Outcome: Progressing 11/23/2024 0710 by Elnor Zachary CROME, RN Outcome: Progressing   Problem: Health Behavior/Discharge Planning: Goal: Ability to manage health-related needs will improve 11/23/2024 0711 by Elnor Zachary CROME, RN Outcome: Progressing 11/23/2024 0710 by Elnor Zachary CROME, RN Outcome: Progressing   Problem: Clinical Measurements: Goal: Ability to maintain clinical measurements within normal limits will improve 11/23/2024 0711 by Elnor Zachary CROME, RN Outcome: Progressing 11/23/2024 0710 by Elnor Zachary CROME, RN Outcome: Progressing Goal: Will remain free from infection 11/23/2024 0711 by Elnor Zachary CROME, RN Outcome: Progressing 11/23/2024 0710 by Elnor Zachary CROME, RN Outcome: Progressing Goal: Diagnostic test results will improve 11/23/2024 0711 by Elnor Zachary CROME, RN Outcome: Progressing 11/23/2024 0710 by Elnor Zachary CROME, RN Outcome: Progressing Goal: Respiratory complications will improve 11/23/2024 0711 by Elnor Zachary CROME, RN Outcome: Progressing 11/23/2024 0710 by Elnor Zachary CROME, RN Outcome: Progressing Goal: Cardiovascular complication will be avoided 11/23/2024 0711 by Elnor Zachary CROME, RN Outcome: Progressing 11/23/2024 0710 by Elnor Zachary CROME, RN Outcome: Progressing   "

## 2024-11-23 NOTE — Progress Notes (Signed)
 "    Subjective: NAEON.  I again had a long conversation with [name] and both of his children at the bedside.  Hematuria has resolved.  Objective: Vital signs in last 24 hours: Temp:  [97.8 F (36.6 C)-98.6 F (37 C)] 98.4 F (36.9 C) (01/26 1606) Pulse Rate:  [81-92] 87 (01/26 1606) Resp:  [14-21] 21 (01/26 1606) BP: (99-113)/(42-53) 113/53 (01/26 1606) SpO2:  [97 %-98 %] 97 % (01/26 1606) Weight:  [90.1 kg] 90.1 kg (01/26 0500)  Assessment/Plan: # Gross hematuria-resolved # Anemia # ARF # BPH  Bilateral percutaneous nephrostomy tubes placed in IR 11/18/2024.  Clear yellow urine bilaterally.  Low UOP.  Repeat renal ultrasound shows improvement from severe to mild hydronephrosis.  Depending on his renal recovery or lack thereof, may consider reengaging interventional radiology for internalizing stents by antegrade placement versus maintaining percutaneous nephrostomy tubes long-term versus palliative removal.  Serum creatinine hovering between 5.20 and 5.40 for the last couple of days.  Reviewed that recovery would be slow if at all.  Scheduled to follow-up with nephrology outpatient in 2 to 3 weeks.  Nursing to hand irrigate Foley today with and remove.   S/p 6d ceftriaxone .  CT tomorrow to reassess bladder wall thickening.  Urology will follow   Intake/Output from previous day: 01/25 0701 - 01/26 0700 In: 500 [P.O.:500] Out: 300 [Urine:300]  Intake/Output this shift: Total I/O In: -  Out: 325 [Urine:325]  Physical Exam:  General: Alert and oriented CV: No cyanosis Lungs: equal chest rise Gu: foley catheter removed.  Clear yellow urine in both percutaneous nephrostomy tubes.  Lab Results: Recent Labs    11/22/24 1420 11/22/24 1821 11/23/24 0233  HGB 8.8* 9.0* 7.8*  HCT 27.4* 28.1* 24.4*   BMET Recent Labs    11/22/24 1821 11/23/24 0233  NA 135 135  K 5.4* 4.8  CL 107 106  CO2 21* 19*  GLUCOSE 116* 122*  BUN 86* 83*  CREATININE 5.30* 5.20*   CALCIUM  7.4* 7.3*  HGB 9.0* 7.8*  WBC 8.3 7.7     Studies/Results: VAS US  LOWER EXTREMITY VENOUS (DVT) Result Date: 11/23/2024  Lower Venous DVT Study Patient Name:  [name] [facility]  Date of Exam:   11/23/2024 Medical Rec #: [id number]       Accession #:    [id number] Date of Birth: [birth year]        Patient Gender: M Patient Age:   84 years Exam Location:  [facility] Procedure:      VAS US  LOWER EXTREMITY VENOUS (DVT) Referring Phys: PRANAV PATEL --------------------------------------------------------------------------------  Indications: Edema.  Comparison Study: No prior study on file. Performing Technologist: Edilia Elden Appl  Examination Guidelines: A complete evaluation includes B-mode imaging, spectral Doppler, color Doppler, and power Doppler as needed of all accessible portions of each vessel. Bilateral testing is considered an integral part of a complete examination. Limited examinations for reoccurring indications may be performed as noted. The reflux portion of the exam is performed with the patient in reverse Trendelenburg.  +---------+---------------+---------+-----------+----------+--------------+ RIGHT    CompressibilityPhasicitySpontaneityPropertiesThrombus Aging +---------+---------------+---------+-----------+----------+--------------+ CFV      Full           Yes      Yes                                 +---------+---------------+---------+-----------+----------+--------------+ SFJ      Full  Yes      Yes                                 +---------+---------------+---------+-----------+----------+--------------+ FV Prox  Full                                                        +---------+---------------+---------+-----------+----------+--------------+ FV Mid   Full                                                        +---------+---------------+---------+-----------+----------+--------------+ FV DistalFull                                                         +---------+---------------+---------+-----------+----------+--------------+ PFV      Full                                                        +---------+---------------+---------+-----------+----------+--------------+ POP      Full           Yes      Yes                                 +---------+---------------+---------+-----------+----------+--------------+ Posterior tibial vein and peroneal veins were not visualized due to wrap around bandages.  +---------+---------------+---------+-----------+----------+--------------+ LEFT     CompressibilityPhasicitySpontaneityPropertiesThrombus Aging +---------+---------------+---------+-----------+----------+--------------+ CFV      Full           Yes      Yes                                 +---------+---------------+---------+-----------+----------+--------------+ SFJ      Full           Yes      Yes                                 +---------+---------------+---------+-----------+----------+--------------+ FV Prox  Full                                                        +---------+---------------+---------+-----------+----------+--------------+ FV Mid   Full                                                        +---------+---------------+---------+-----------+----------+--------------+ FV  DistalFull                                                        +---------+---------------+---------+-----------+----------+--------------+ PFV      Full                                                        +---------+---------------+---------+-----------+----------+--------------+ POP      Full           Yes      Yes                                 +---------+---------------+---------+-----------+----------+--------------+ Posterior tibial vein and peroneal veins were not visualized due to wrap around bandages.    Summary: RIGHT: - There is no evidence of deep vein  thrombosis in the lower extremity. However, portions of this examination were limited- see technologist comments above.  - No cystic structure found in the popliteal fossa.  LEFT: - There is no evidence of deep vein thrombosis in the lower extremity. However, portions of this examination were limited- see technologist comments above.  - No cystic structure found in the popliteal fossa.  *See table(s) above for measurements and observations.    Preliminary    US  RENAL Result Date: 11/22/2024 EXAM: RETROPERITONEAL ULTRASOUND OF THE KIDNEYS 11/22/2024 04:59:00 PM TECHNIQUE: Real-time ultrasonography of the retroperitoneum, specifically the kidneys and urinary bladder, was performed. COMPARISON: US  Renal 11/14/2024. CLINICAL HISTORY: Acute kidney injury. ICD10: [id number] Acute kidney injury. FINDINGS: RIGHT KIDNEY: Right kidney measures 11.8 x 5.0 x 5.4 cm. Calculated volume 166 ml is normal. Mild hydronephrosis is noted. Nephrostomy catheter is noted in place. LEFT KIDNEY: Left kidney measures 11.8 x 4.9 x 4.9 cm. The calculated volume is 148 ml. Mild hydronephrosis is noted. The nephrostomy catheter is noted. BLADDER: The bladder is decompressed. Note is made of bilateral pleural effusions. IMPRESSION: 1. Bilateral mild hydronephrosis with nephrostomy catheters in place. 2. Bilateral pleural effusions. Electronically signed by: Oneil Devonshire MD 11/22/2024 06:20 PM EST RP Workstation: [id number]       LOS: 12 days   Guy Bourdon, NP [facility] Pager: [phone]  11/23/2024, 6:19 PM  "

## 2024-11-24 DIAGNOSIS — N170 Acute kidney failure with tubular necrosis: Secondary | ICD-10-CM | POA: Diagnosis not present

## 2024-11-24 LAB — RENAL FUNCTION PANEL
Albumin: 2.6 g/dL — ABNORMAL LOW (ref 3.5–5.0)
Anion gap: 9 (ref 5–15)
BUN: 85 mg/dL — ABNORMAL HIGH (ref 8–23)
CO2: 21 mmol/L — ABNORMAL LOW (ref 22–32)
Calcium: 7.6 mg/dL — ABNORMAL LOW (ref 8.9–10.3)
Chloride: 106 mmol/L (ref 98–111)
Creatinine, Ser: 5.01 mg/dL — ABNORMAL HIGH (ref 0.61–1.24)
GFR, Estimated: 11 mL/min — ABNORMAL LOW
Glucose, Bld: 101 mg/dL — ABNORMAL HIGH (ref 70–99)
Phosphorus: 2.5 mg/dL (ref 2.5–4.6)
Potassium: 4.5 mmol/L (ref 3.5–5.1)
Sodium: 136 mmol/L (ref 135–145)

## 2024-11-24 LAB — GLUCOSE, CAPILLARY
Glucose-Capillary: 67 mg/dL — ABNORMAL LOW (ref 70–99)
Glucose-Capillary: 149 mg/dL — ABNORMAL HIGH (ref 70–99)
Glucose-Capillary: 154 mg/dL — ABNORMAL HIGH (ref 70–99)
Glucose-Capillary: 140 mg/dL — ABNORMAL HIGH (ref 70–99)
Glucose-Capillary: 154 mg/dL — ABNORMAL HIGH (ref 70–99)

## 2024-11-24 LAB — MAGNESIUM: Magnesium: 1.7 mg/dL (ref 1.7–2.4)

## 2024-11-24 NOTE — Progress Notes (Signed)
 "    Subjective: NAEON.  I again had a long conversation with [name] and both of his children at the bedside.  Hematuria has resolved.  Objective: Vital signs in last 24 hours: Temp:  [97.9 F (36.6 C)-99.7 F (37.6 C)] 97.9 F (36.6 C) (01/27 1133) Pulse Rate:  [79-90] 88 (01/27 1133) Resp:  [14-21] 20 (01/27 1133) BP: (88-113)/(44-53) 102/51 (01/27 1133) SpO2:  [96 %-99 %] 99 % (01/27 1133) Weight:  [91.3 kg] 91.3 kg (01/27 0500)  Assessment/Plan: # Gross hematuria-resolved # Anemia # ARF # BPH  Bilateral percutaneous nephrostomy tubes placed in IR 11/18/2024.  Clear yellow urine bilaterally.  Low UOP.  Repeat renal ultrasound shows improvement from severe to mild hydronephrosis.  Depending on his renal recovery or lack thereof, may consider reengaging interventional radiology for internalizing stents by antegrade placement versus maintaining percutaneous nephrostomy tubes long-term versus palliative removal.  Family will discuss to preference of existing PCNT or likely foley catheter replacement today.   SCr 5.01 this am. Scheduled to follow-up with nephrology outpatient in 2 to 3 weeks.  S/p 6d ceftriaxone .  Holding pelvic CT until plan of care regarding PCNT is finalized.   Urology will follow   Intake/Output from previous day: 01/26 0701 - 01/27 0700 In: 15 [I.V.:15] Out: 725 [Urine:725]  Intake/Output this shift: No intake/output data recorded.  Physical Exam:  General: Alert and oriented CV: No cyanosis Lungs: equal chest rise Gu: foley catheter removed.  Clear yellow urine in both percutaneous nephrostomy tubes.  Lab Results: Recent Labs    11/22/24 1821 11/23/24 0233 11/23/24 2028  HGB 9.0* 7.8* 8.2*  HCT 28.1* 24.4* 24.8*   BMET Recent Labs    11/23/24 0233 11/23/24 2028 11/24/24 0347  NA 135  --  136  K 4.8  --  4.5  CL 106  --  106  CO2 19*  --  21*  GLUCOSE 122*  --  101*  BUN 83*  --  85*  CREATININE 5.20*  --  5.01*  CALCIUM   7.3*  --  7.6*  HGB 7.8* 8.2*  --   WBC 7.7 8.9  --      Studies/Results: VAS US  LOWER EXTREMITY VENOUS (DVT) Result Date: 11/24/2024  Lower Venous DVT Study Patient Name:  [name] [facility]  Date of Exam:   11/23/2024 Medical Rec #: [id number]       Accession #:    [id number] Date of Birth: 19-Jan-1941        Patient Gender: M Patient Age:   84 years Exam Location:  [facility] Procedure:      VAS US  LOWER EXTREMITY VENOUS (DVT) Referring Phys: PRANAV PATEL --------------------------------------------------------------------------------  Indications: Edema.  Comparison Study: No prior study on file. Performing Technologist: Edilia Elden Appl  Examination Guidelines: A complete evaluation includes B-mode imaging, spectral Doppler, color Doppler, and power Doppler as needed of all accessible portions of each vessel. Bilateral testing is considered an integral part of a complete examination. Limited examinations for reoccurring indications may be performed as noted. The reflux portion of the exam is performed with the patient in reverse Trendelenburg.  +---------+---------------+---------+-----------+----------+--------------+ RIGHT    CompressibilityPhasicitySpontaneityPropertiesThrombus Aging +---------+---------------+---------+-----------+----------+--------------+ CFV      Full           Yes      Yes                                 +---------+---------------+---------+-----------+----------+--------------+  SFJ      Full           Yes      Yes                                 +---------+---------------+---------+-----------+----------+--------------+ FV Prox  Full                                                        +---------+---------------+---------+-----------+----------+--------------+ FV Mid   Full                                                        +---------+---------------+---------+-----------+----------+--------------+ FV DistalFull                                                         +---------+---------------+---------+-----------+----------+--------------+ PFV      Full                                                        +---------+---------------+---------+-----------+----------+--------------+ POP      Full           Yes      Yes                                 +---------+---------------+---------+-----------+----------+--------------+ Posterior tibial vein and peroneal veins were not visualized due to wrap around bandages.  +---------+---------------+---------+-----------+----------+--------------+ LEFT     CompressibilityPhasicitySpontaneityPropertiesThrombus Aging +---------+---------------+---------+-----------+----------+--------------+ CFV      Full           Yes      Yes                                 +---------+---------------+---------+-----------+----------+--------------+ SFJ      Full           Yes      Yes                                 +---------+---------------+---------+-----------+----------+--------------+ FV Prox  Full                                                        +---------+---------------+---------+-----------+----------+--------------+ FV Mid   Full                                                        +---------+---------------+---------+-----------+----------+--------------+  FV DistalFull                                                        +---------+---------------+---------+-----------+----------+--------------+ PFV      Full                                                        +---------+---------------+---------+-----------+----------+--------------+ POP      Full           Yes      Yes                                 +---------+---------------+---------+-----------+----------+--------------+ Posterior tibial vein and peroneal veins were not visualized due to wrap around bandages.    Summary: RIGHT: - There is no evidence of deep vein  thrombosis in the lower extremity. However, portions of this examination were limited- see technologist comments above.  - No cystic structure found in the popliteal fossa.  LEFT: - There is no evidence of deep vein thrombosis in the lower extremity. However, portions of this examination were limited- see technologist comments above.  - No cystic structure found in the popliteal fossa.  *See table(s) above for measurements and observations. Electronically signed by Lonni Gaskins MD on 11/24/2024 at 10:16:08 AM.    Final    US  RENAL Result Date: 11/22/2024 EXAM: RETROPERITONEAL ULTRASOUND OF THE KIDNEYS 11/22/2024 04:59:00 PM TECHNIQUE: Real-time ultrasonography of the retroperitoneum, specifically the kidneys and urinary bladder, was performed. COMPARISON: US  Renal 11/14/2024. CLINICAL HISTORY: Acute kidney injury. ICD10: [id number] Acute kidney injury. FINDINGS: RIGHT KIDNEY: Right kidney measures 11.8 x 5.0 x 5.4 cm. Calculated volume 166 ml is normal. Mild hydronephrosis is noted. Nephrostomy catheter is noted in place. LEFT KIDNEY: Left kidney measures 11.8 x 4.9 x 4.9 cm. The calculated volume is 148 ml. Mild hydronephrosis is noted. The nephrostomy catheter is noted. BLADDER: The bladder is decompressed. Note is made of bilateral pleural effusions. IMPRESSION: 1. Bilateral mild hydronephrosis with nephrostomy catheters in place. 2. Bilateral pleural effusions. Electronically signed by: Oneil Devonshire MD 11/22/2024 06:20 PM EST RP Workstation: [id number]       LOS: 13 days   Guy Bourdon, NP [facility] Pager: [phone]  11/24/2024, 1:36 PM  "

## 2024-11-24 NOTE — Progress Notes (Signed)
 [facility] Progress Note Patient: [name] FMW:[id number] DOB: 01/26/1941  DOA: 11/11/2024 DOS: the patient was seen and examined on 11/24/2024  Brief Summary: Patient is a 84 y.o.  male with history of CAD s/p PCI 2008, HFmrEF, HTN, HLD-presented with generalized weakness/fatigue-loss of appetite-found to be hypotensive with AKI-thought to have obstructive uropathy.  Initially admitted to the ICU for pressors-stabilized and subsequently transferred to [facility].   Significant events: 1/14>> admit to ICU 1/16>> received 1 unit PRBC. 1/19>> transferred to [facility]. 1/21>> bilateral PCN performed. 1/22>> 1 unit PRBC given. 1/24>> 1 more PRBC given 1/25>> US  renal mild bilateral hydronephrosis mild pleural effusion.  Significant studies: 1/14>> CXR: No PNA 1/14>> CT head: No acute intracranial process 1/14>> CT abdomen/pelvis: Severe bilateral hydronephrosis 1/14>> renal ultrasound: Severe bilateral hydronephrosis. 1/15>> echo: EF 55-60% 1/17>> renal ultrasound: Severe bilateral hydronephrosis 1/19>> x-ray KUB: Dilated loops of small bowel-at least consistent with a partial SBO.   Significant microbiology data: 1/14>> COVID/influenza/RSV PCR: Negative 1/14>> blood culture: No growth 1/15>> urine culture: No growth   Consults: PCCM Nephrology Neurology IR   Assessment and plan: AKI Secondary to obstructive uropathy Presents with decreased urine output.  Found to have AKI with hyperkalemia upon admission.  Concern for infection.  Admitted to the ICU for vasopressor. Foley catheter in place to treat hypertension. Urology consulted. Nephrology and IR following. Given that the renal function did not improve and continues to have hydronephrosis IR placed bilateral PCN. Anticipating slow improvement in his renal function. Eventually will require internalization of the stent.  US  renal on 1/25 shows mild hydronephrosis.  Decompressed bladder. Nephrology currently signed off.    Hyperkalemia As well as hypokalemia Metabolic acidosis Secondary to AKI and poor p.o. intake. Monitor daily electrolyte. Now potassium is elevated and therefore patient is receiving low Lokelma  and other therapy to lower it.   Multifactorial shock Secondary to septic shock due to UTI and hypovolemic shock from poor oral intake BP has stabilized with supportive care Briefly required pressors on initial presentation Blood pressure remains soft. Still on midodrine .  Dose increased and patient was treated with albumin  and blood for hemodynamic instability. Monitor for now.   Complicated UTI Treated with Rocephin -although urine cultures negative-this was done after patient was given antibiotics   Ileus resolved. Patient with significant distention of his bowels. No definitive obstruction seen. Actually has a bowel movement and passing gas. Repeat x-ray on 1/23 shows improvement in ileus.   GERD Dyspeptic symptoms are much better Continue PPI As needed simethicone .   Normocytic anemia Acute blood loss anemia Likely secondary to critical illness-has some hematuria after PCN placement. Received 1 PRBC earlier. Another PRBC ordered 01/22 Repeat transfusion 1/24. Holding aspirin  and heparin  for DVT prophylaxis. Follow CBC   History of CAD s/p PCI 2008 No anginal symptoms   Chronic HFmrEF Euvolemic Diuretics on hold   HTN Presented with hypotension all antihypertensives on hold. In fact actually on midodrine .   Type of diabetes mellitus, well-controlled without long-term insulin  use with CKD. Currently on sliding scale insulin .   Anorexia/failure to thrive syndrome Poor oral intake for the past several months Patient not keen on getting NG tube placed Thankfully slowly improving-continue to encourage oral intake-continue supplements Remains on Remeron  Palliative care following.   Moderate Malnutrition Class I obesity. Body mass index is 30.96 kg/m.  Etiology:  acute illness Signs/Symptoms: mild fat depletion, mild muscle depletion Interventions: Refer to RD note for recommendations Placing the patient at high risk for outcome.  Stage  II coccyx ulcer, upper back pressure ulcers. Present on admission. Monitor.  Hematuria. Bilateral PCN had bloody discharge.  Now resolved. IR consulted to reevaluate the PCN.  Recommended continue irrigation. Monitor H&H.  Hypoalbuminemia with third spacing. Albumin  level is 2. Has lower extremity swelling.  Bilateral pleural effusion. Suspect this is third spacing related to hypoalbuminemia. Will eventually need to resume diuresis.  Goals of care conversation. 1/25 extensive discussion with patient at bedside.  Daughter and son at bedside as well. Patient feels that the way he is feeling right now he would like to go through an attempt of resuscitation understanding that there is a very high risk of his course after resuscitation may not be without any significant complications. Patient does not want to be on lifelong hemodialysis.  Patient also knows that he would likely be not a candidate for lifelong hemodialysis. Patient would like to attempt short-term ICU stay should he need 1.  DVT Prophylaxis: Place and maintain sequential compression device Start: 11/21/24 1110 SCDs Start: 11/11/24 1516   Data review I have Reviewed nursing notes, Vitals, and Lab results. Since last encounter, pertinent lab results CBC and BMP   . I have ordered test including CBC and BMP  . I have discussed pt's care plan and test results with urology  .    Physical exam. Vitals:   11/24/24 0521 11/24/24 0806 11/24/24 1133 11/24/24 1600  BP: (!) 100/44 (!) 104/51 (!) 102/51 (!) 103/52  Pulse: 84 90 88 85  Resp: 14 15 20 17   Temp:  98.6 F (37 C) 97.9 F (36.6 C) 98.4 F (36.9 C)  TempSrc:  Oral Oral Oral  SpO2: 97% 97% 99% 99%  Weight:      Height:       General: in Mild distress, No Rash Cardiovascular: S1 and S2  Present, No Murmur Respiratory: Good respiratory effort, Bilateral Air entry present. No Crackles, No wheezes Abdomen: Bowel Sound present, tympanic today.  No tenderness Extremities: Unchanged lower extremity edema Neuro: Alert and oriented x3, no new focal deficit  Subjective: Denies any nausea or vomiting.  Had a BM which was solid.  No no blood in the stool.  Bilateral PCN are also now showing no blood.  Family Communication: Family at bedside.  Disposition Plan: Status is: Inpatient Remains inpatient appropriate because: Monitor for improvement in renal function.   Planned Discharge Destination: TBD Diet: Diet Order             Diet regular Room service appropriate? Yes; Fluid consistency: Thin  Diet effective now                   MEDICATIONS: Scheduled Meds:  Chlorhexidine  Gluconate Cloth  6 each Topical Daily   feeding supplement (NEPRO CARB STEADY)  237 mL Oral TID BM   feeding supplement (PROSource TF20)  60 mL Per Tube Daily   Gerhardt's butt cream  1 Application Topical TID   insulin  aspart  0-9 Units Subcutaneous TID WC   midodrine   15 mg Oral TID WC   mirtazapine   15 mg Oral QHS   multivitamin  1 tablet Oral QHS   pantoprazole   40 mg Oral BID   saccharomyces boulardii  250 mg Oral BID   simethicone   80 mg Oral QID   sodium bicarbonate   1,300 mg Oral BID   sodium chloride  flush  10-40 mL Intracatheter Q12H   sodium chloride  flush  5 mL Intracatheter Q12H   Continuous Infusions: PRN Meds:.acetaminophen , ondansetron  (ZOFRAN )  IV, polyethylene glycol, sodium chloride  flush  Author: Yetta Blanch, MD  [facility] 11/24/2024  7:49 PM Between 7PM-7AM, please contact night-coverage, check [email or web address] for on call.

## 2024-11-24 NOTE — Plan of Care (Signed)

## 2024-11-24 NOTE — Progress Notes (Signed)
 Occupational Therapy Treatment Patient Details Name: [name] MRN: [id number] DOB: May 29, 1941 Today's Date: 11/24/2024   History of present illness 84 y.o. male admitted 11/11/24 with weakness, fatigue, decreased appetite, decreased urine output. Workup for severe AKI, septic shock secondary to UTI, severe bilateral hydronephrosis. Transfer out of ICU 1/18. Imaging 1/19 consistent with partial SBO. PMH includes HTN, HLD, DM, NSTEMI, ischemic cardiomyopathy, HF, CVA, heart murmur.   OT comments  Pt progressing well towards OT goals. Focus of session on progressing functional mobility and increasing engagement in ADL tasks. Educated Pt on log roll technique for effective bed mobility, requiring CGA for transfer. Pt engaged in ADL grooming tasks with bed in chair position and set-up A. Pt continues to benefit from acute OT services, continue per POC.       If plan is discharge home, recommend the following:  A little help with walking and/or transfers;A little help with bathing/dressing/bathroom;Assistance with cooking/housework;Assist for transportation;Help with stairs or ramp for entrance   Equipment Recommendations  None recommended by OT    Recommendations for Other Services      Precautions / Restrictions Precautions Precautions: Fall Recall of Precautions/Restrictions: Intact Precaution/Restrictions Comments: watch BP; B nephrostomy tubes Restrictions Weight Bearing Restrictions Per Provider Order: No       Mobility Bed Mobility Overal bed mobility: Needs Assistance Bed Mobility: Sit to Sidelying, Sidelying to Sit, Rolling Rolling: Contact guard assist, Used rails Sidelying to sit: Min assist, Contact guard assist     Sit to sidelying: Contact guard assist General bed mobility comments: Educated Pt on log roll technique as Pt reported pulling himself upright has been his biggest challenge. Initially Pt required Min A to come to sitting from sidelying but with practice  transitioned to CGA.    Transfers Overall transfer level: Needs assistance                 General transfer comment: Pt requesting to not transfer OOB due to bilteral foot pain. Pt engaged in laterl scoots to R towards [facility] with Min A for management of nephrostomy awareness     Balance Overall balance assessment: Needs assistance Sitting-balance support: No upper extremity supported, Feet supported Sitting balance-Leahy Scale: Good                                     ADL either performed or assessed with clinical judgement   ADL Overall ADL's : Needs assistance/impaired     Grooming: Oral care;Set up;Sitting Grooming Details (indicate cue type and reason): bed in chair position             Lower Body Dressing: Maximal assistance;Sitting/lateral leans       Toileting- Clothing Manipulation and Hygiene: Total assistance;Bed level Toileting - Clothing Manipulation Details (indicate cue type and reason): Rolling for posterior peri care            Extremity/Trunk Assessment Upper Extremity Assessment Upper Extremity Assessment: Overall WFL for tasks assessed            Vision       Perception     Praxis     Communication Communication Communication: Impaired Factors Affecting Communication: Hearing impaired   Cognition Arousal: Alert Behavior During Therapy: WFL for tasks assessed/performed Cognition: No apparent impairments  Following commands: Intact        Cueing   Cueing Techniques: Verbal cues, Visual cues  Exercises      Shoulder Instructions       General Comments VSS during activity.    Pertinent Vitals/ Pain       Pain Assessment Pain Assessment: Faces Faces Pain Scale: Hurts even more Pain Location: bilateral feet Pain Descriptors / Indicators: Guarding, Grimacing, Discomfort Pain Intervention(s): Limited activity within patient's tolerance, Monitored during  session  Home Living                                          Prior Functioning/Environment              Frequency  Min 1X/week        Progress Toward Goals  OT Goals(current goals can now be found in the care plan section)  Progress towards OT goals: Progressing toward goals  Acute Rehab OT Goals Patient Stated Goal: to get better OT Goal Formulation: With patient Time For Goal Achievement: 11/30/24 Potential to Achieve Goals: Good  Plan      Co-evaluation                 AM-PAC OT 6 Clicks Daily Activity     Outcome Measure   Help from another person eating meals?: None Help from another person taking care of personal grooming?: None Help from another person toileting, which includes using toliet, bedpan, or urinal?: A Lot Help from another person bathing (including washing, rinsing, drying)?: A Little Help from another person to put on and taking off regular upper body clothing?: A Little Help from another person to put on and taking off regular lower body clothing?: A Lot 6 Click Score: 18    End of Session    OT Visit Diagnosis: Unsteadiness on feet (R26.81);Muscle weakness (generalized) (M62.81);Other symptoms and signs involving cognitive function   Activity Tolerance Patient tolerated treatment well   Patient Left in bed;with call bell/phone within reach;with bed alarm set;with family/visitor present   Nurse Communication          Time: [phone] OT Time Calculation (min): 29 min  Charges: OT General Charges $OT Visit: 1 Visit OT Treatments $Self Care/Home Management : 8-22 mins $Therapeutic Activity: 8-22 mins  [name], OTR/L.  [facility] Acute Rehabilitation  Office: [phone]   [name] 11/24/2024, 12:13 PM

## 2024-11-24 NOTE — Progress Notes (Signed)
 Patient ID: [name], male   DOB: 1941/09/13, 84 y.o.   MRN: [id number]    Referring Physician(s): Delia Smalls, NP   Supervising Physician: Vanice Revel  Patient Status:  [facility] - In-pt  Chief Complaint:  bilateral hydronephrosis; bilateral nephrostomy tubes placed 1/21 by Dr. Philip   Subjective:  Pt doing well today. Hematuria has resolved. No back/flank pain. No new complaints. Resting comfortably in bed.  Allergies: Patient has no known allergies.  Medications: Prior to Admission medications  Medication Sig Start Date End Date Taking? Authorizing Provider  aspirin  81 MG tablet Take 81 mg by mouth at bedtime.   Yes [provider]  atorvastatin  (LIPITOR) 80 MG tablet Take 1 tablet (80 mg total) by mouth daily. Patient taking differently: Take 80 mg by mouth at bedtime. 11/03/24  Yes Jordan, Peter M, MD  carvedilol  (COREG ) 12.5 MG tablet Take 1 tablet (12.5 mg total) by mouth 2 (two) times daily. 11/03/24 02/01/25 Yes Jordan, Peter M, MD  dapagliflozin propanediol (FARXIGA) 5 MG TABS tablet Take 5 mg by mouth at bedtime.   Yes [provider]  furosemide  (LASIX ) 20 MG tablet Take 1 tablet (20 mg total) by mouth daily. 11/03/24  Yes Jordan, Peter M, MD  losartan  (COZAAR ) 100 MG tablet Take 1 tablet (100 mg total) by mouth daily. 11/03/24  Yes Jordan, Peter M, MD  Lutein-Zeaxanthin (OCUVITE LUTEIN 25 PO) Take 1 capsule by mouth at bedtime.   Yes [provider]  Multiple Vitamins-Minerals (CENTRUM SILVER PO) Take 1 tablet by mouth daily.    Yes [provider]  nitroGLYCERIN  (NITROSTAT ) 0.4 MG SL tablet Place 1 tablet (0.4 mg total) under the tongue every 5 (five) minutes as needed for chest pain (x 3 doses). 11/03/24  Yes Jordan, Peter M, MD  spironolactone  (ALDACTONE ) 25 MG tablet Take 1/2 tablet by mouth (12.5 mg) daily 11/03/24  Yes Jordan, Peter M, MD     Vital Signs: BP (!) 102/51   Pulse 88   Temp 97.9 F (36.6 C) (Oral)   Resp 20   Ht  5' 5 (1.651 m)   Wt 201 lb 4.5 oz (91.3 kg)   SpO2 99%   BMI 33.49 kg/m   Physical Exam Vitals and nursing note reviewed.  Constitutional:      General: He is not in acute distress. Cardiovascular:     Rate and Rhythm: Normal rate.  Pulmonary:     Effort: Pulmonary effort is normal.  Abdominal:     Palpations: Abdomen is soft.     Tenderness: There is no abdominal tenderness. There is no right CVA tenderness or left CVA tenderness.     Comments: Left and right nephrostomy bag with ~255ml of clear yellow urine in each. Each tube site appears well. No pain. Dressed appropriately.  Skin:    General: Skin is warm and dry.  Neurological:     Mental Status: He is alert and oriented to person, place, and time. Mental status is at baseline.     Imaging: VAS US  LOWER EXTREMITY VENOUS (DVT) Result Date: 11/24/2024  Lower Venous DVT Study Patient Name:  [name] [facility]  Date of Exam:   11/23/2024 Medical Rec #: [id number]       Accession #:    [id number] Date of Birth: 12-Jan-1941        Patient Gender: M Patient Age:   45 years Exam Location:  [facility] Procedure:      VAS US  LOWER EXTREMITY VENOUS (  DVT) Referring Phys: PRANAV PATEL --------------------------------------------------------------------------------  Indications: Edema.  Comparison Study: No prior study on file. Performing Technologist: Edilia Elden Appl  Examination Guidelines: A complete evaluation includes B-mode imaging, spectral Doppler, color Doppler, and power Doppler as needed of all accessible portions of each vessel. Bilateral testing is considered an integral part of a complete examination. Limited examinations for reoccurring indications may be performed as noted. The reflux portion of the exam is performed with the patient in reverse Trendelenburg.  +---------+---------------+---------+-----------+----------+--------------+ RIGHT    CompressibilityPhasicitySpontaneityPropertiesThrombus Aging  +---------+---------------+---------+-----------+----------+--------------+ CFV      Full           Yes      Yes                                 +---------+---------------+---------+-----------+----------+--------------+ SFJ      Full           Yes      Yes                                 +---------+---------------+---------+-----------+----------+--------------+ FV Prox  Full                                                        +---------+---------------+---------+-----------+----------+--------------+ FV Mid   Full                                                        +---------+---------------+---------+-----------+----------+--------------+ FV DistalFull                                                        +---------+---------------+---------+-----------+----------+--------------+ PFV      Full                                                        +---------+---------------+---------+-----------+----------+--------------+ POP      Full           Yes      Yes                                 +---------+---------------+---------+-----------+----------+--------------+ Posterior tibial vein and peroneal veins were not visualized due to wrap around bandages.  +---------+---------------+---------+-----------+----------+--------------+ LEFT     CompressibilityPhasicitySpontaneityPropertiesThrombus Aging +---------+---------------+---------+-----------+----------+--------------+ CFV      Full           Yes      Yes                                 +---------+---------------+---------+-----------+----------+--------------+ SFJ      Full  Yes      Yes                                 +---------+---------------+---------+-----------+----------+--------------+ FV Prox  Full                                                        +---------+---------------+---------+-----------+----------+--------------+ FV Mid   Full                                                         +---------+---------------+---------+-----------+----------+--------------+ FV DistalFull                                                        +---------+---------------+---------+-----------+----------+--------------+ PFV      Full                                                        +---------+---------------+---------+-----------+----------+--------------+ POP      Full           Yes      Yes                                 +---------+---------------+---------+-----------+----------+--------------+ Posterior tibial vein and peroneal veins were not visualized due to wrap around bandages.    Summary: RIGHT: - There is no evidence of deep vein thrombosis in the lower extremity. However, portions of this examination were limited- see technologist comments above.  - No cystic structure found in the popliteal fossa.  LEFT: - There is no evidence of deep vein thrombosis in the lower extremity. However, portions of this examination were limited- see technologist comments above.  - No cystic structure found in the popliteal fossa.  *See table(s) above for measurements and observations. Electronically signed by Lonni Gaskins MD on 11/24/2024 at 10:16:08 AM.    Final    US  RENAL Result Date: 11/22/2024 EXAM: RETROPERITONEAL ULTRASOUND OF THE KIDNEYS 11/22/2024 04:59:00 PM TECHNIQUE: Real-time ultrasonography of the retroperitoneum, specifically the kidneys and urinary bladder, was performed. COMPARISON: US  Renal 11/14/2024. CLINICAL HISTORY: Acute kidney injury. ICD10: [id number] Acute kidney injury. FINDINGS: RIGHT KIDNEY: Right kidney measures 11.8 x 5.0 x 5.4 cm. Calculated volume 166 ml is normal. Mild hydronephrosis is noted. Nephrostomy catheter is noted in place. LEFT KIDNEY: Left kidney measures 11.8 x 4.9 x 4.9 cm. The calculated volume is 148 ml. Mild hydronephrosis is noted. The nephrostomy catheter is noted. BLADDER: The bladder is decompressed.  Note is made of bilateral pleural effusions. IMPRESSION: 1. Bilateral mild hydronephrosis with nephrostomy catheters in place. 2. Bilateral pleural effusions. Electronically signed by: Oneil Devonshire MD 11/22/2024 06:20 PM EST RP Workstation: [id number]    Labs:  CBC: Recent Labs  11/22/24 1420 11/22/24 1821 11/23/24 0233 11/23/24 2028  WBC 6.9 8.3 7.7 8.9  HGB 8.8* 9.0* 7.8* 8.2*  HCT 27.4* 28.1* 24.4* 24.8*  PLT 176 209 176 210    COAGS: Recent Labs    11/14/24 0447  INR 1.3*    BMP: Recent Labs    11/22/24 0609 11/22/24 1821 11/23/24 0233 11/24/24 0347  NA 134* 135 135 136  K 5.7* 5.4* 4.8 4.5  CL 108 107 106 106  CO2 15* 21* 19* 21*  GLUCOSE 90 116* 122* 101*  BUN 86* 86* 83* 85*  CALCIUM  7.6* 7.4* 7.3* 7.6*  CREATININE 5.48* 5.30* 5.20* 5.01*  GFRNONAA 10* 10* 10* 11*    LIVER FUNCTION TESTS: Recent Labs    11/11/24 1425 11/11/24 1643 11/17/24 0156 11/22/24 1420 11/22/24 1821 11/23/24 0233 11/24/24 0347  BILITOT 0.3 0.2  --  0.3  --   --   --   AST 19 19  --  30  --   --   --   ALT 20 18  --  31  --   --   --   ALKPHOS 78 62  --  71  --   --   --   PROT 5.7* 4.9*  --  4.3*  --   --   --   ALBUMIN  2.5* 2.2*   < > 2.1* 2.1* 2.6* 2.6*   < > = values in this interval not displayed.    Assessment and Plan:  S/p bil nephrostomy tubes placed 1/21 by Dr. Philip for obstructive uropathy - hematuria now resolved. Hgb stable 8.2 - pt following with hospice and urology to determine next steps. Per urology, possibly looking to change to internalized ureter stents vs long term nephrostomy tubes.  - IR will continue to intermittently follow   Please reach out to IR with any questions or concerns.  Electronically Signed: Kimble VEAR Clas, PA-C 11/24/2024, 2:49 PM   I spent a total of 15 Minutes at the the patient's bedside AND on the patient's hospital floor or unit, greater than 50% of which was counseling/coordinating care for bilateral nephrostomy tube  follow up.

## 2024-11-25 ENCOUNTER — Inpatient Hospital Stay (HOSPITAL_COMMUNITY)

## 2024-11-25 DIAGNOSIS — N170 Acute kidney failure with tubular necrosis: Secondary | ICD-10-CM | POA: Diagnosis not present

## 2024-11-25 DIAGNOSIS — Z515 Encounter for palliative care: Secondary | ICD-10-CM | POA: Diagnosis not present

## 2024-11-25 DIAGNOSIS — Z7189 Other specified counseling: Secondary | ICD-10-CM | POA: Diagnosis not present

## 2024-11-25 LAB — CBC
HCT: 23.8 % — ABNORMAL LOW (ref 39.0–52.0)
Hemoglobin: 7.6 g/dL — ABNORMAL LOW (ref 13.0–17.0)
MCH: 31.4 pg (ref 26.0–34.0)
MCHC: 31.9 g/dL (ref 30.0–36.0)
MCV: 98.3 fL (ref 80.0–100.0)
Platelets: 220 10*3/uL (ref 150–400)
RBC: 2.42 MIL/uL — ABNORMAL LOW (ref 4.22–5.81)
RDW: 17.5 % — ABNORMAL HIGH (ref 11.5–15.5)
WBC: 6.4 10*3/uL (ref 4.0–10.5)
nRBC: 0 % (ref 0.0–0.2)

## 2024-11-25 LAB — RENAL FUNCTION PANEL
Albumin: 2.4 g/dL — ABNORMAL LOW (ref 3.5–5.0)
Anion gap: 9 (ref 5–15)
BUN: 91 mg/dL — ABNORMAL HIGH (ref 8–23)
CO2: 22 mmol/L (ref 22–32)
Calcium: 7.4 mg/dL — ABNORMAL LOW (ref 8.9–10.3)
Chloride: 104 mmol/L (ref 98–111)
Creatinine, Ser: 4.72 mg/dL — ABNORMAL HIGH (ref 0.61–1.24)
GFR, Estimated: 12 mL/min — ABNORMAL LOW
Glucose, Bld: 121 mg/dL — ABNORMAL HIGH (ref 70–99)
Phosphorus: 2.3 mg/dL — ABNORMAL LOW (ref 2.5–4.6)
Potassium: 5.2 mmol/L — ABNORMAL HIGH (ref 3.5–5.1)
Sodium: 136 mmol/L (ref 135–145)

## 2024-11-25 LAB — GLUCOSE, CAPILLARY
Glucose-Capillary: 116 mg/dL — ABNORMAL HIGH (ref 70–99)
Glucose-Capillary: 164 mg/dL — ABNORMAL HIGH (ref 70–99)
Glucose-Capillary: 92 mg/dL (ref 70–99)
Glucose-Capillary: 142 mg/dL — ABNORMAL HIGH (ref 70–99)

## 2024-11-25 LAB — MAGNESIUM: Magnesium: 1.6 mg/dL — ABNORMAL LOW (ref 1.7–2.4)

## 2024-11-25 MED ORDER — LACTATED RINGERS IV BOLUS
500.0000 mL | Freq: Once | INTRAVENOUS | Status: AC
  Administered 2024-11-25: 500 mL via INTRAVENOUS

## 2024-11-25 MED ORDER — POTASSIUM CHLORIDE CRYS ER 20 MEQ PO TBCR: 40.0000 meq | EXTENDED_RELEASE_TABLET | Freq: Once | ORAL | Status: DC

## 2024-11-25 MED ORDER — MAGNESIUM SULFATE 2 GM/50ML IV SOLN
2.0000 g | Freq: Once | INTRAVENOUS | Status: AC
  Administered 2024-11-25: 2 g via INTRAVENOUS
  Filled 2024-11-25: qty 50

## 2024-11-25 MED ORDER — LACTATED RINGERS IV SOLN: INTRAVENOUS | Status: AC

## 2024-11-25 MED ORDER — SODIUM ZIRCONIUM CYCLOSILICATE 10 G PO PACK
10.0000 g | PACK | Freq: Two times a day (BID) | ORAL | Status: AC
  Administered 2024-11-25 (×2): 10 g via ORAL
  Filled 2024-11-25 (×2): qty 1

## 2024-11-25 NOTE — Progress Notes (Signed)
 [facility] Progress Note Patient: [name] FMW:[id number] DOB: Sep 22, 1941  DOA: 11/11/2024 DOS: the patient was seen and examined on 11/25/2024  Brief Summary: Patient is a 84 y.o.  male with history of CAD s/p PCI 2008, HFmrEF, HTN, HLD-presented with generalized weakness/fatigue-loss of appetite-found to be hypotensive with AKI-thought to have obstructive uropathy.  Initially admitted to the ICU for pressors-stabilized and subsequently transferred to [facility].   Significant events: 1/14>> admit to ICU 1/16>> received 1 unit PRBC. 1/19>> transferred to [facility]. 1/21>> bilateral PCN performed. 1/22>> 1 unit PRBC given. 1/24>> 1 more PRBC given 1/25>> US  renal mild bilateral hydronephrosis mild pleural effusion.  Significant studies: 1/14>> CXR: No PNA 1/14>> CT head: No acute intracranial process 1/14>> CT abdomen/pelvis: Severe bilateral hydronephrosis 1/14>> renal ultrasound: Severe bilateral hydronephrosis. 1/15>> echo: EF 55-60% 1/17>> renal ultrasound: Severe bilateral hydronephrosis 1/19>> x-ray KUB: Dilated loops of small bowel-at least consistent with a partial SBO.   Significant microbiology data: 1/14>> COVID/influenza/RSV PCR: Negative 1/14>> blood culture: No growth 1/15>> urine culture: No growth   Consults: PCCM Nephrology Neurology IR   Assessment and plan: AKI Secondary to obstructive uropathy Presents with decreased urine output.  Found to have AKI with hyperkalemia upon admission.  Concern for infection.  Admitted to the ICU for vasopressor. Foley catheter in place to treat hypertension. Urology consulted, IR and following. Nephrology seen the patient and currently signed off Given that the renal function did not improve and continues to have hydronephrosis IR placed bilateral PCN. Anticipating slow improvement in his renal function. Eventually will require internalization of the stent.  US  renal on 1/25 shows mild hydronephrosis.  Decompressed  bladder.     Hyperkalemia Lokelma .   Metabolic acidosis Due to AKI on oral bicarb   Multifactorial shock Secondary to septic shock due to UTI and hypovolemic shock from poor oral intake BP has stabilized with supportive care Briefly required pressors on initial presentation Blood pressure remains soft, IV fluids on 11/25/2024 Continue midodrine  and monitor.  Symptom-free appears nontoxic   Complicated UTI Treated with Rocephin -although urine cultures negative-this was done after patient was given antibiotics   Ileus resolved. Patient with significant distention of his bowels. No definitive obstruction seen. Actually has a bowel movement and passing gas. Repeat x-ray on 1/23 shows improvement in ileus.   GERD Dyspeptic symptoms are much better Continue PPI As needed simethicone .   Normocytic anemia Acute blood loss anemia Likely secondary to critical illness-has some hematuria after PCN placement. Has received total 3 units of packed RBC transfusion last on 11/21/2024 Holding aspirin  and heparin  for DVT prophylaxis. Follow CBC, type screen done will follow closely   History of CAD s/p PCI 2008 No anginal symptoms   Chronic HFmrEF Euvolemic Diuretics on hold   HTN Blood pressure low currently on IV fluids and midodrine     Anorexia/failure to thrive syndrome Poor oral intake for the past several months Patient not keen on getting NG tube placed Thankfully slowly improving-continue to encourage oral intake-continue supplements Remains on Remeron  Palliative care following.   Moderate Malnutrition Class I obesity. Body mass index is 30.96 kg/m.  Etiology: acute illness Signs/Symptoms: mild fat depletion, mild muscle depletion Interventions: Refer to RD note for recommendations Placing the patient at high risk for outcome.  Stage II coccyx ulcer, upper back pressure ulcers. Present on admission. Monitor.  Hematuria. Bilateral PCN had bloody discharge.  Now  resolved. IR consulted to reevaluate the PCN.  Recommended continue irrigation. Monitor H&H.  Hypoalbuminemia with third spacing.  Albumin  level is 2. Has lower extremity swelling.  Bilateral pleural effusion. Suspect this is third spacing related to hypoalbuminemia. Will eventually need to resume diuresis.  Type of diabetes mellitus, well-controlled without long-term insulin  use with CKD. Currently on sliding scale insulin .  Lab Results  Component Value Date   HGBA1C 6.4 (H) 11/12/2024    CBG (last 3)  Recent Labs    11/24/24 1628 11/24/24 2126 11/25/24 0809  GLUCAP 140* 154* 116*    Goals of care conversation. 1/25 extensive discussion with patient at bedside.  Daughter and son at bedside as well. Patient feels that the way he is feeling right now he would like to go through an attempt of resuscitation understanding that there is a very high risk of his course after resuscitation may not be without any significant complications. Patient does not want to be on lifelong hemodialysis.  Patient also knows that he would likely be not a candidate for lifelong hemodialysis. Patient would like to attempt short-term ICU stay should he need 1.    DVT Prophylaxis: Place and maintain sequential compression device Start: 11/21/24 1110 SCDs Start: 11/11/24 1516   Data review I have Reviewed nursing notes, Vitals, and Lab results. Since last encounter, pertinent lab results CBC and BMP   . I have ordered test including CBC and BMP  . I have discussed pt's care plan and test results with urology  .    Physical exam. Vitals:   11/24/24 2315 11/25/24 0401 11/25/24 0454 11/25/24 0809  BP: (!) 112/52   (!) 87/40  Pulse:    77  Resp:    15  Temp: 98.7 F (37.1 C) 98.4 F (36.9 C)  98.5 F (36.9 C)  TempSrc: Axillary Axillary  Oral  SpO2:    98%  Weight:   92.9 kg   Height:       Awake Alert, No new F.N deficits, Normal affect [place].AT,PERRAL Supple Neck, No JVD,   Symmetrical  Chest wall movement, Good air movement bilaterally, CTAB RRR,No Gallops, Rubs or new Murmurs,  +ve B.Sounds, Abd Soft, No tenderness, bilateral nephrostomy tubes in place No Cyanosis, Clubbing or edema    Subjective: Patient in bed, appears comfortable, denies any headache, no fever, no chest pain or pressure, no shortness of breath , no abdominal pain. No new focal weakness.   Family Communication: daughter at bedside 11/25/24.  Disposition Plan: Status is: Inpatient Remains inpatient appropriate because: Monitor for improvement in renal function.   Planned Discharge Destination: TBD Diet: Diet Order             Diet regular Room service appropriate? Yes; Fluid consistency: Thin  Diet effective now                    Data Review:   Patient Lines/Drains/Airways Status     Active Line/Drains/Airways     Name Placement date Placement time Site Days   Peripheral IV 11/20/24 22 G 1 Anterior;Left Forearm 11/20/24  1209  Forearm  5   Nephrostomy Left 10.2 Fr. 11/18/24  1006  Left  7   Nephrostomy Right 10.2 Fr. 11/18/24  1011  Right  7   Wound 11/11/24 1850 Pressure Injury Coccyx Mid Stage 2 -  Partial thickness loss of dermis presenting as a shallow open injury with a red, pink wound bed without slough. 11/11/24  1850  Coccyx  14   Wound 11/11/24 1850 Pressure Injury Back Mid;Upper Deep Tissue Pressure Injury - Purple or maroon  localized area of discolored intact skin or blood-filled blister due to damage of underlying soft tissue from pressure and/or shear. 11/11/24  1850  Back  14   Wound 11/11/24 1850 Pressure Injury Head Posterior;Medial Deep Tissue Pressure Injury - Purple or maroon localized area of discolored intact skin or blood-filled blister due to damage of underlying soft tissue from pressure and/or shear. 11/11/24  1850  Head  14             Inpatient Medications  Scheduled Meds:  Chlorhexidine  Gluconate Cloth  6 each Topical Daily   feeding supplement  (NEPRO CARB STEADY)  237 mL Oral TID BM   feeding supplement (PROSource TF20)  60 mL Per Tube Daily   Gerhardt's butt cream  1 Application Topical TID   insulin  aspart  0-9 Units Subcutaneous TID WC   midodrine   15 mg Oral TID WC   mirtazapine   15 mg Oral QHS   multivitamin  1 tablet Oral QHS   pantoprazole   40 mg Oral BID   saccharomyces boulardii  250 mg Oral BID   simethicone   80 mg Oral QID   sodium bicarbonate   1,300 mg Oral BID   sodium chloride  flush  5 mL Intracatheter Q12H   sodium zirconium cyclosilicate   10 g Oral BID   Continuous Infusions:  lactated ringers      lactated ringers      PRN Meds:.acetaminophen , ondansetron  (ZOFRAN ) IV, polyethylene glycol  DVT Prophylaxis  Place and maintain sequential compression device Start: 11/21/24 1110 SCDs Start: 11/11/24 1516   Recent Labs  Lab 11/22/24 1420 11/22/24 1821 11/23/24 0233 11/23/24 2028 11/25/24 0339  WBC 6.9 8.3 7.7 8.9 6.4  HGB 8.8* 9.0* 7.8* 8.2* 7.6*  HCT 27.4* 28.1* 24.4* 24.8* 23.8*  PLT 176 209 176 210 220  MCV 98.2 98.9 98.4 97.3 98.3  MCH 31.5 31.7 31.5 32.2 31.4  MCHC 32.1 32.0 32.0 33.1 31.9  RDW 18.6* 18.5* 18.5* 18.0* 17.5*    Recent Labs  Lab 11/21/24 0551 11/21/24 1654 11/21/24 2335 11/22/24 0609 11/22/24 1420 11/22/24 1821 11/23/24 0233 11/24/24 0347 11/25/24 0339  NA 135   < > 135 134*  --  135 135 136 136  K 5.4*   < > 5.4* 5.7*  --  5.4* 4.8 4.5 5.2*  CL 109   < > 107 108  --  107 106 106 104  CO2 16*   < > 21* 15*  --  21* 19* 21* 22  ANIONGAP 10   < > 8 10  --  8 10 9 9   GLUCOSE 89   < > 121* 90  --  116* 122* 101* 121*  BUN 90*   < > 91* 86*  --  86* 83* 85* 91*  CREATININE 5.51*   < > 5.21* 5.48*  --  5.30* 5.20* 5.01* 4.72*  AST  --   --   --   --  30  --   --   --   --   ALT  --   --   --   --  31  --   --   --   --   ALKPHOS  --   --   --   --  71  --   --   --   --   BILITOT  --   --   --   --  0.3  --   --   --   --   ALBUMIN  2.3*   < >  --  2.0* 2.1* 2.1* 2.6*  2.6* 2.4*  LATICACIDVEN  --   --   --   --  2.2*  --   --   --   --   TSH  --   --   --   --  4.520*  --   --   --   --   MG 1.8  --  1.6*  --   --   --  1.8 1.7 1.6*  PHOS 2.7   < >  --  3.0  --  2.4* 2.3* 2.5 2.3*  CALCIUM  7.5*   < > 7.2* 7.6*  --  7.4* 7.3* 7.6* 7.4*   < > = values in this interval not displayed.      Recent Labs  Lab 11/21/24 0551 11/21/24 1654 11/21/24 2335 11/22/24 0609 11/22/24 1420 11/22/24 1821 11/23/24 0233 11/24/24 0347 11/25/24 0339  LATICACIDVEN  --   --   --   --  2.2*  --   --   --   --   TSH  --   --   --   --  4.520*  --   --   --   --   MG 1.8  --  1.6*  --   --   --  1.8 1.7 1.6*  CALCIUM  7.5*   < > 7.2* 7.6*  --  7.4* 7.3* 7.6* 7.4*   < > = values in this interval not displayed.    --------------------------------------------------------------------------------------------------------------- Lab Results  Component Value Date   CHOL 101 03/07/2017   HDL 46 03/07/2017   LDLCALC 43 03/07/2017   TRIG 59 03/07/2017   CHOLHDL 2.2 03/07/2017    Lab Results  Component Value Date   HGBA1C 6.4 (H) 11/12/2024   Recent Labs    11/22/24 1420  TSH 4.520*  FREET4 1.07   No results for input(s): VITAMINB12, FOLATE, FERRITIN, TIBC, IRON, RETICCTPCT in the last 72 hours. ------------------------------------------------------------------------------------------------------------------ Cardiac Enzymes No results for input(s): CKMB, TROPONINI, MYOGLOBIN in the last 168 hours.  Invalid input(s): CK  Micro Results No results found for this or any previous visit (from the past 240 hours).  Radiology Reports  VAS US  LOWER EXTREMITY VENOUS (DVT) Result Date: 11/24/2024  Lower Venous DVT Study Patient Name:  [facility]  Date of Exam:   11/23/2024 Medical Rec #: [id number]       Accession #:    [id number] Date of Birth: [birth year]        Patient Gender: M Patient Age:   26 years Exam Location:  [facility] Procedure:       VAS US  LOWER EXTREMITY VENOUS (DVT) Referring Phys: PRANAV PATEL --------------------------------------------------------------------------------  Indications: Edema.  Comparison Study: No prior study on file. Performing Technologist: Edilia Elden Appl  Examination Guidelines: A complete evaluation includes B-mode imaging, spectral Doppler, color Doppler, and power Doppler as needed of all accessible portions of each vessel. Bilateral testing is considered an integral part of a complete examination. Limited examinations for reoccurring indications may be performed as noted. The reflux portion of the exam is performed with the patient in reverse Trendelenburg.  +---------+---------------+---------+-----------+----------+--------------+ RIGHT    CompressibilityPhasicitySpontaneityPropertiesThrombus Aging +---------+---------------+---------+-----------+----------+--------------+ CFV      Full           Yes      Yes                                 +---------+---------------+---------+-----------+----------+--------------+  SFJ      Full           Yes      Yes                                 +---------+---------------+---------+-----------+----------+--------------+ FV Prox  Full                                                        +---------+---------------+---------+-----------+----------+--------------+ FV Mid   Full                                                        +---------+---------------+---------+-----------+----------+--------------+ FV DistalFull                                                        +---------+---------------+---------+-----------+----------+--------------+ PFV      Full                                                        +---------+---------------+---------+-----------+----------+--------------+ POP      Full           Yes      Yes                                  +---------+---------------+---------+-----------+----------+--------------+ Posterior tibial vein and peroneal veins were not visualized due to wrap around bandages.  +---------+---------------+---------+-----------+----------+--------------+ LEFT     CompressibilityPhasicitySpontaneityPropertiesThrombus Aging +---------+---------------+---------+-----------+----------+--------------+ CFV      Full           Yes      Yes                                 +---------+---------------+---------+-----------+----------+--------------+ SFJ      Full           Yes      Yes                                 +---------+---------------+---------+-----------+----------+--------------+ FV Prox  Full                                                        +---------+---------------+---------+-----------+----------+--------------+ FV Mid   Full                                                        +---------+---------------+---------+-----------+----------+--------------+  FV DistalFull                                                        +---------+---------------+---------+-----------+----------+--------------+ PFV      Full                                                        +---------+---------------+---------+-----------+----------+--------------+ POP      Full           Yes      Yes                                 +---------+---------------+---------+-----------+----------+--------------+ Posterior tibial vein and peroneal veins were not visualized due to wrap around bandages.    Summary: RIGHT: - There is no evidence of deep vein thrombosis in the lower extremity. However, portions of this examination were limited- see technologist comments above.  - No cystic structure found in the popliteal fossa.  LEFT: - There is no evidence of deep vein thrombosis in the lower extremity. However, portions of this examination were limited- see technologist comments above.  - No  cystic structure found in the popliteal fossa.  *See table(s) above for measurements and observations. Electronically signed by Lonni Gaskins MD on 11/24/2024 at 10:16:08 AM.    Final       Signature  -   Lavada Stank M.D on 11/25/2024 at 8:57 AM   -  To page go to [email or web address]

## 2024-11-25 NOTE — Progress Notes (Signed)
 Physical Therapy Treatment Patient Details Name: [name] MRN: [id number] DOB: 12-10-1940 Today's Date: 11/25/2024   History of Present Illness 84 y.o. male admitted 11/11/24 with weakness, fatigue, decreased appetite, decreased urine output. Workup for severe AKI, septic shock secondary to UTI, severe bilateral hydronephrosis. Transfer out of ICU 1/18. Imaging 1/19 consistent with partial SBO. PMH includes HTN, HLD, DM, NSTEMI, ischemic cardiomyopathy, HF, CVA, heart murmur.    PT Comments  Pt received in supine and agreeable to session. Pt noted to be incontinent of bowel and requires total A for pericare. Pt requires min A for bed mobility and CGA for transfers and gait trials. Pt able to increase gait distance this session, but is limited by R knee pain and quick fatigue. Pt reports slight dizziness once sitting EOB, but resolves during mobility. Pt continues to benefit from PT services to progress toward functional mobility goals.    If plan is discharge home, recommend the following: A little help with walking and/or transfers;A lot of help with bathing/dressing/bathroom;Assistance with cooking/housework;Direct supervision/assist for medications management;Direct supervision/assist for financial management;Assist for transportation;Help with stairs or ramp for entrance   Can travel by private vehicle     No  Equipment Recommendations  Rolling walker (2 wheels)    Recommendations for Other Services       Precautions / Restrictions Precautions Precautions: Fall Recall of Precautions/Restrictions: Intact Precaution/Restrictions Comments: watch BP; B nephrostomy tubes Restrictions Weight Bearing Restrictions Per Provider Order: No     Mobility  Bed Mobility Overal bed mobility: Needs Assistance Bed Mobility: Rolling, Sidelying to Sit Rolling: Contact guard assist, Used rails Sidelying to sit: Min assist, Used rails, HOB elevated       General bed mobility comments:  Rolling to R with use of rail. Sitting to EOB with assist for trunk elevation and cues for sequencing. Difficulty scooting forward, but able to do with cues for hand placement    Transfers Overall transfer level: Needs assistance Equipment used: Rolling walker (2 wheels) Transfers: Sit to/from Stand Sit to Stand: Contact guard assist           General transfer comment: STS from EOB and recliner with cues for hand placement    Ambulation/Gait Ambulation/Gait assistance: Contact guard assist, +2 safety/equipment Gait Distance (Feet): 20 Feet (+15) Assistive device: Rolling walker (2 wheels) Gait Pattern/deviations: Step-to pattern, Decreased stride length, Trunk flexed, Decreased stance time - right       General Gait Details: Cues for RW proximity and upward gaze. Reliance on RW support to offload RLE. 1 seated rest break due to fatigue and R knee pain. chair follow for safety   Stairs             Wheelchair Mobility     Tilt Bed    Modified Rankin (Stroke Patients Only)       Balance Overall balance assessment: Needs assistance Sitting-balance support: No upper extremity supported, Feet supported Sitting balance-Leahy Scale: Good     Standing balance support: Bilateral upper extremity supported, Reliant on assistive device for balance, During functional activity Standing balance-Leahy Scale: Poor Standing balance comment: UE reliance on RW                            Communication Communication Communication: Impaired Factors Affecting Communication: Hearing impaired  Cognition Arousal: Alert Behavior During Therapy: WFL for tasks assessed/performed   PT - Cognitive impairments: Problem solving  Following commands: Intact Following commands impaired: Only follows one step commands consistently, Follows multi-step commands with increased time    Cueing Cueing Techniques: Verbal cues, Visual cues   Exercises      General Comments        Pertinent Vitals/Pain Pain Assessment Pain Assessment: Faces Faces Pain Scale: Hurts a little bit Pain Location: bilateral feet, R knee (chronic) Pain Descriptors / Indicators: Guarding, Grimacing, Discomfort Pain Intervention(s): Limited activity within patient's tolerance, Monitored during session, Repositioned     PT Goals (current goals can now be found in the care plan section) Acute Rehab PT Goals Patient Stated Goal: return home; children interested in SNF rehab PT Goal Formulation: With patient Time For Goal Achievement: 11/30/24 Progress towards PT goals: Progressing toward goals    Frequency    Min 3X/week       AM-PAC PT 6 Clicks Mobility   Outcome Measure  Help needed turning from your back to your side while in a flat bed without using bedrails?: A Little Help needed moving from lying on your back to sitting on the side of a flat bed without using bedrails?: A Little Help needed moving to and from a bed to a chair (including a wheelchair)?: A Little Help needed standing up from a chair using your arms (e.g., wheelchair or bedside chair)?: A Little Help needed to walk in hospital room?: A Little Help needed climbing 3-5 steps with a railing? : Total 6 Click Score: 16    End of Session Equipment Utilized During Treatment: Gait belt Activity Tolerance: Patient tolerated treatment well Patient left: in chair;with call bell/phone within reach;with family/visitor present Nurse Communication: Mobility status PT Visit Diagnosis: Unsteadiness on feet (R26.81);Muscle weakness (generalized) (M62.81)     Time: 9086-9050 PT Time Calculation (min) (ACUTE ONLY): 36 min  Charges:    $Gait Training: 8-22 mins $Therapeutic Activity: 8-22 mins PT General Charges $$ ACUTE PT VISIT: 1 Visit                    Darryle George, PTA Acute Rehabilitation Services Secure Chat Preferred  [phone]    Darryle George 11/25/2024, 10:50 AM

## 2024-11-25 NOTE — Progress Notes (Signed)
 "   Referring Physician(s): Delia Smalls, NP  Supervising Physician: Vanice Revel  Patient Status:  [facility] - In-pt  Chief Complaint: bilateral hydronephrosis; bilateral nephrostomy tubes placed 1/21 by Dr Philip  Subjective: Patient lying in bed. Denies pain at PCN sites. No complaints today. Family at bedside.   Allergies: Patient has no known allergies.  Medications: Prior to Admission medications  Medication Sig Start Date End Date Taking? Authorizing Provider  aspirin  81 MG tablet Take 81 mg by mouth at bedtime.   Yes [provider]  atorvastatin  (LIPITOR) 80 MG tablet Take 1 tablet (80 mg total) by mouth daily. Patient taking differently: Take 80 mg by mouth at bedtime. 11/03/24  Yes Jordan, Peter M, MD  carvedilol  (COREG ) 12.5 MG tablet Take 1 tablet (12.5 mg total) by mouth 2 (two) times daily. 11/03/24 02/01/25 Yes Jordan, Peter M, MD  dapagliflozin propanediol (FARXIGA) 5 MG TABS tablet Take 5 mg by mouth at bedtime.   Yes [provider]  furosemide  (LASIX ) 20 MG tablet Take 1 tablet (20 mg total) by mouth daily. 11/03/24  Yes Jordan, Peter M, MD  losartan  (COZAAR ) 100 MG tablet Take 1 tablet (100 mg total) by mouth daily. 11/03/24  Yes Jordan, Peter M, MD  Lutein-Zeaxanthin (OCUVITE LUTEIN 25 PO) Take 1 capsule by mouth at bedtime.   Yes [provider]  Multiple Vitamins-Minerals (CENTRUM SILVER PO) Take 1 tablet by mouth daily.    Yes [provider]  nitroGLYCERIN  (NITROSTAT ) 0.4 MG SL tablet Place 1 tablet (0.4 mg total) under the tongue every 5 (five) minutes as needed for chest pain (x 3 doses). 11/03/24  Yes Jordan, Peter M, MD  spironolactone  (ALDACTONE ) 25 MG tablet Take 1/2 tablet by mouth (12.5 mg) daily 11/03/24  Yes Jordan, Peter M, MD     Vital Signs: BP 103/62   Pulse 77   Temp 99.4 F (37.4 C) (Oral)   Resp 17   Ht 5' 5 (1.651 m)   Wt 204 lb 12.9 oz (92.9 kg)   SpO2 100%   BMI 34.08 kg/m   Physical  Exam Cardiovascular:     Rate and Rhythm: Normal rate.  Pulmonary:     Effort: Pulmonary effort is normal. No respiratory distress.  Genitourinary:    Comments: Bilateral PCNs- Clear yellow urine in both gravity bags Both PCNs flush easily Insertion sites unremarkable Sutures intact and in place Dressed appropriately Skin:    General: Skin is warm.  Neurological:     Mental Status: He is oriented to person, place, and time.  Psychiatric:        Mood and Affect: Mood normal.        Behavior: Behavior normal.     Imaging: VAS US  LOWER EXTREMITY VENOUS (DVT) Result Date: 11/24/2024  Lower Venous DVT Study Patient Name:  CODIE KROGH [facility]  Date of Exam:   11/23/2024 Medical Rec #: [id number]       Accession #:    [id number] Date of Birth: July 13, 1941        Patient Gender: M Patient Age:   84 years Exam Location:  [facility] Procedure:      VAS US  LOWER EXTREMITY VENOUS (DVT) Referring Phys: PRANAV PATEL --------------------------------------------------------------------------------  Indications: Edema.  Comparison Study: No prior study on file. Performing Technologist: Edilia Elden Appl  Examination Guidelines: A complete evaluation includes B-mode imaging, spectral Doppler, color Doppler, and power Doppler as needed of all accessible portions of each vessel. Bilateral testing is considered an  integral part of a complete examination. Limited examinations for reoccurring indications may be performed as noted. The reflux portion of the exam is performed with the patient in reverse Trendelenburg.  +---------+---------------+---------+-----------+----------+--------------+ RIGHT    CompressibilityPhasicitySpontaneityPropertiesThrombus Aging +---------+---------------+---------+-----------+----------+--------------+ CFV      Full           Yes      Yes                                 +---------+---------------+---------+-----------+----------+--------------+ SFJ      Full            Yes      Yes                                 +---------+---------------+---------+-----------+----------+--------------+ FV Prox  Full                                                        +---------+---------------+---------+-----------+----------+--------------+ FV Mid   Full                                                        +---------+---------------+---------+-----------+----------+--------------+ FV DistalFull                                                        +---------+---------------+---------+-----------+----------+--------------+ PFV      Full                                                        +---------+---------------+---------+-----------+----------+--------------+ POP      Full           Yes      Yes                                 +---------+---------------+---------+-----------+----------+--------------+ Posterior tibial vein and peroneal veins were not visualized due to wrap around bandages.  +---------+---------------+---------+-----------+----------+--------------+ LEFT     CompressibilityPhasicitySpontaneityPropertiesThrombus Aging +---------+---------------+---------+-----------+----------+--------------+ CFV      Full           Yes      Yes                                 +---------+---------------+---------+-----------+----------+--------------+ SFJ      Full           Yes      Yes                                 +---------+---------------+---------+-----------+----------+--------------+ FV Prox  Full                                                        +---------+---------------+---------+-----------+----------+--------------+  FV Mid   Full                                                        +---------+---------------+---------+-----------+----------+--------------+ FV DistalFull                                                         +---------+---------------+---------+-----------+----------+--------------+ PFV      Full                                                        +---------+---------------+---------+-----------+----------+--------------+ POP      Full           Yes      Yes                                 +---------+---------------+---------+-----------+----------+--------------+ Posterior tibial vein and peroneal veins were not visualized due to wrap around bandages.    Summary: RIGHT: - There is no evidence of deep vein thrombosis in the lower extremity. However, portions of this examination were limited- see technologist comments above.  - No cystic structure found in the popliteal fossa.  LEFT: - There is no evidence of deep vein thrombosis in the lower extremity. However, portions of this examination were limited- see technologist comments above.  - No cystic structure found in the popliteal fossa.  *See table(s) above for measurements and observations. Electronically signed by Lonni Gaskins MD on 11/24/2024 at 10:16:08 AM.    Final    US  RENAL Result Date: 11/22/2024 EXAM: RETROPERITONEAL ULTRASOUND OF THE KIDNEYS 11/22/2024 04:59:00 PM TECHNIQUE: Real-time ultrasonography of the retroperitoneum, specifically the kidneys and urinary bladder, was performed. COMPARISON: US  Renal 11/14/2024. CLINICAL HISTORY: Acute kidney injury. ICD10: [id number] Acute kidney injury. FINDINGS: RIGHT KIDNEY: Right kidney measures 11.8 x 5.0 x 5.4 cm. Calculated volume 166 ml is normal. Mild hydronephrosis is noted. Nephrostomy catheter is noted in place. LEFT KIDNEY: Left kidney measures 11.8 x 4.9 x 4.9 cm. The calculated volume is 148 ml. Mild hydronephrosis is noted. The nephrostomy catheter is noted. BLADDER: The bladder is decompressed. Note is made of bilateral pleural effusions. IMPRESSION: 1. Bilateral mild hydronephrosis with nephrostomy catheters in place. 2. Bilateral pleural effusions. Electronically signed by: Oneil Devonshire MD 11/22/2024 06:20 PM EST RP Workstation: [id number]    Labs:  CBC: Recent Labs    11/22/24 1821 11/23/24 0233 11/23/24 2028 11/25/24 0339  WBC 8.3 7.7 8.9 6.4  HGB 9.0* 7.8* 8.2* 7.6*  HCT 28.1* 24.4* 24.8* 23.8*  PLT 209 176 210 220    COAGS: Recent Labs    11/14/24 0447  INR 1.3*    BMP: Recent Labs    11/22/24 1821 11/23/24 0233 11/24/24 0347 11/25/24 0339  NA 135 135 136 136  K 5.4* 4.8 4.5 5.2*  CL 107 106 106 104  CO2 21* 19* 21* 22  GLUCOSE 116* 122* 101* 121*  BUN 86* 83* 85*  91*  CALCIUM  7.4* 7.3* 7.6* 7.4*  CREATININE 5.30* 5.20* 5.01* 4.72*  GFRNONAA 10* 10* 11* 12*    LIVER FUNCTION TESTS: Recent Labs    11/11/24 1425 11/11/24 1643 11/17/24 0156 11/22/24 1420 11/22/24 1821 11/23/24 0233 11/24/24 0347 11/25/24 0339  BILITOT 0.3 0.2  --  0.3  --   --   --   --   AST 19 19  --  30  --   --   --   --   ALT 20 18  --  31  --   --   --   --   ALKPHOS 78 62  --  71  --   --   --   --   PROT 5.7* 4.9*  --  4.3*  --   --   --   --   ALBUMIN  2.5* 2.2*   < > 2.1* 2.1* 2.6* 2.6* 2.4*   < > = values in this interval not displayed.    Assessment and Plan:  Patient to make decision regarding whether to internalize stents or keep PCNs long term.  Drain Location: right CVA Size: Fr size: 10 French [place] Date of placement: 11/18/2024 Currently to: Drain collection device: gravity bag 24 hour output: 650 mL documented   Drain Location: left CVA Size: Fr size: 10 French [place] Date of placement: 11/18/2024 Currently to: Drain collection device: gravity bag 24 hour output: 525 mL documented   Current examination: Clear yellow urine in both gravity bags. Both PCNs flush easily.  Insertion sites unremarkable. Sutures intact and in place. Dressed appropriately.    Plan: Continue BID flushes with 5 cc NS. Record output Q shift. Dressing changes QD or PRN if soiled.  Call IR APP or on call IR MD if difficulty flushing or sudden  change in drain output.    Discharge planning: Please contact IR APP or on call IR MD prior to patient d/c to ensure appropriate follow up plans are in place. Patient will need nephrostomy exchange in 6-8 weeks. IR scheduler will contact patient with date/time of appointment. Record output QD, dressing changes every 2-3 days or earlier if soiled.    Patient will need ongoing urology follow up.   IR will continue to follow - please call with questions or concerns.     Electronically Signed: Villa Burgin B Oracio Galen, NP 11/25/2024, 3:15 PM   I spent a total of 15 Minutes at the the patient's bedside AND on the patient's hospital floor or unit, greater than 50% of which was counseling/coordinating care for bilateral nephrostomy tubes.   "

## 2024-11-25 NOTE — Plan of Care (Signed)

## 2024-11-25 NOTE — Progress Notes (Signed)
 "    Subjective: NAEON.  Another long conversation with patient and family regarding internalization of ureteral stents and likely Foley catheter versus keeping percutaneous nephrostomy tubes until after reassessment for potential TURP.  No decision made at this time.  Will revisit tomorrow.  Objective: Vital signs in last 24 hours: Temp:  [98.4 F (36.9 C)-99.4 F (37.4 C)] 99.4 F (37.4 C) (01/28 1230) Pulse Rate:  [77-85] 77 (01/28 1230) Resp:  [15-17] 17 (01/28 1230) BP: (87-115)/(40-62) 103/62 (01/28 1230) SpO2:  [98 %-100 %] 100 % (01/28 1230) Weight:  [92.9 kg] 92.9 kg (01/28 0454)  Assessment/Plan: # Gross hematuria-resolved # Anemia # ARF # BPH  Bilateral percutaneous nephrostomy tubes placed in IR 11/18/2024.  Clear yellow urine bilaterally.  Low UOP.  Repeat renal ultrasound shows improvement from severe to mild hydronephrosis.  Depending on his renal recovery or lack thereof, may consider reengaging interventional radiology for internalizing stents by antegrade placement versus maintaining percutaneous nephrostomy tubes long-term versus palliative removal.  Hope to make decision tomorrow. Discharge if keeping PCNT. Consult IR for antegrade stent placement if family wishes.   Interval improvement in SCr, 4.7  S/p 6d ceftriaxone   Ct pelvis ordered  Urology will follow   Intake/Output from previous day: 01/27 0701 - 01/28 0700 In: 15 [I.V.:15] Out: 625 [Urine:625]  Intake/Output this shift: Total I/O In: 10 [Other:10] Out: 550 [Urine:550]  Physical Exam:  General: Alert and oriented CV: No cyanosis Lungs: equal chest rise Gu: foley catheter removed. Clear yellow urine in both percutaneous nephrostomy tubes.   Lab Results: Recent Labs    11/23/24 0233 11/23/24 2028 11/25/24 0339  HGB 7.8* 8.2* 7.6*  HCT 24.4* 24.8* 23.8*   BMET Recent Labs    11/23/24 2028 11/24/24 0347 11/25/24 0339  NA  --  136 136  K  --  4.5 5.2*  CL  --  106 104  CO2   --  21* 22  GLUCOSE  --  101* 121*  BUN  --  85* 91*  CREATININE  --  5.01* 4.72*  CALCIUM   --  7.6* 7.4*  HGB 8.2*  --  7.6*  WBC 8.9  --  6.4     Studies/Results: VAS US  LOWER EXTREMITY VENOUS (DVT) Result Date: 11/24/2024  Lower Venous DVT Study Patient Name:  [name] [facility]  Date of Exam:   11/23/2024 Medical Rec #: [id number]       Accession #:    [id number] Date of Birth: [birth year]        Patient Gender: M Patient Age:   84 years Exam [facility] Procedure:      VAS US  LOWER EXTREMITY VENOUS (DVT) Referring Phys: PRANAV PATEL --------------------------------------------------------------------------------  Indications: Edema.  Comparison Study: No prior study on file. Performing Technologist: Edilia Elden Appl  Examination Guidelines: A complete evaluation includes B-mode imaging, spectral Doppler, color Doppler, and power Doppler as needed of all accessible portions of each vessel. Bilateral testing is considered an integral part of a complete examination. Limited examinations for reoccurring indications may be performed as noted. The reflux portion of the exam is performed with the patient in reverse Trendelenburg.  +---------+---------------+---------+-----------+----------+--------------+ RIGHT    CompressibilityPhasicitySpontaneityPropertiesThrombus Aging +---------+---------------+---------+-----------+----------+--------------+ CFV      Full           Yes      Yes                                 +---------+---------------+---------+-----------+----------+--------------+  SFJ      Full           Yes      Yes                                 +---------+---------------+---------+-----------+----------+--------------+ FV Prox  Full                                                        +---------+---------------+---------+-----------+----------+--------------+ FV Mid   Full                                                         +---------+---------------+---------+-----------+----------+--------------+ FV DistalFull                                                        +---------+---------------+---------+-----------+----------+--------------+ PFV      Full                                                        +---------+---------------+---------+-----------+----------+--------------+ POP      Full           Yes      Yes                                 +---------+---------------+---------+-----------+----------+--------------+ Posterior tibial vein and peroneal veins were not visualized due to wrap around bandages.  +---------+---------------+---------+-----------+----------+--------------+ LEFT     CompressibilityPhasicitySpontaneityPropertiesThrombus Aging +---------+---------------+---------+-----------+----------+--------------+ CFV      Full           Yes      Yes                                 +---------+---------------+---------+-----------+----------+--------------+ SFJ      Full           Yes      Yes                                 +---------+---------------+---------+-----------+----------+--------------+ FV Prox  Full                                                        +---------+---------------+---------+-----------+----------+--------------+ FV Mid   Full                                                        +---------+---------------+---------+-----------+----------+--------------+  FV DistalFull                                                        +---------+---------------+---------+-----------+----------+--------------+ PFV      Full                                                        +---------+---------------+---------+-----------+----------+--------------+ POP      Full           Yes      Yes                                 +---------+---------------+---------+-----------+----------+--------------+ Posterior tibial vein and peroneal  veins were not visualized due to wrap around bandages.    Summary: RIGHT: - There is no evidence of deep vein thrombosis in the lower extremity. However, portions of this examination were limited- see technologist comments above.  - No cystic structure found in the popliteal fossa.  LEFT: - There is no evidence of deep vein thrombosis in the lower extremity. However, portions of this examination were limited- see technologist comments above.  - No cystic structure found in the popliteal fossa.  *See table(s) above for measurements and observations. Electronically signed by Lonni Gaskins MD on 11/24/2024 at 10:16:08 AM.    Final        LOS: 14 days   Ole Bourdon, NP [facility] Pager: [phone]  11/25/2024, 3:15 PM  "

## 2024-11-25 NOTE — Plan of Care (Signed)
" °  Problem: Education: Goal: Knowledge of General Education information will improve Description: Including pain rating scale, medication(s)/side effects and non-pharmacologic comfort measures Outcome: Progressing   Problem: Clinical Measurements: Goal: Ability to maintain clinical measurements within normal limits will improve Outcome: Progressing   Problem: Activity: Goal: Risk for activity intolerance will decrease Outcome: Progressing   Problem: Nutrition: Goal: Adequate nutrition will be maintained Outcome: Progressing   Problem: Pain Managment: Goal: General experience of comfort will improve and/or be controlled Outcome: Progressing   Problem: Safety: Goal: Ability to remain free from injury will improve Outcome: Progressing   Problem: Metabolic: Goal: Ability to maintain appropriate glucose levels will improve Outcome: Progressing   "

## 2024-11-25 NOTE — TOC Progression Note (Signed)
 Transition of Care [facility]) - Progression Note    Patient Details  Name: [name] MRN: [id number] Date of Birth: Feb 03, 1941  [facility] CM/SW Contact  Sharyne Drum, Student-Social Work Phone Number: 11/25/2024, 11:14 AM  Clinical Narrative:     MSW Student spoke to pt and pt son and daughter and SNF options and ranking preferences. Pt son and dtr have been researching choices and have ranked [facility] first, [facility] second, and [facility] third. MSW Student and CSW will continue to follow for bed availability at pt discharge.    Expected Discharge Plan: Skilled Nursing Facility Barriers to Discharge: Continued Medical Work up, English As A Second Language Teacher               Expected Discharge Plan and Services In-house Referral: Clinical Social Work     Living arrangements for the past 2 months: Single Family Home                                       Social Drivers of Health (SDOH) Interventions SDOH Screenings   Food Insecurity: No Food Insecurity (11/11/2024)  Housing: Low Risk (11/11/2024)  Transportation Needs: No Transportation Needs (11/11/2024)  Utilities: Not At Risk (11/11/2024)  Social Connections: Moderately Integrated (11/11/2024)  Tobacco Use: Medium Risk (11/11/2024)    Readmission Risk Interventions     No data to display

## 2024-11-26 ENCOUNTER — Other Ambulatory Visit: Payer: Self-pay | Admitting: Student

## 2024-11-26 ENCOUNTER — Inpatient Hospital Stay (HOSPITAL_COMMUNITY)

## 2024-11-26 DIAGNOSIS — N170 Acute kidney failure with tubular necrosis: Secondary | ICD-10-CM | POA: Diagnosis not present

## 2024-11-26 DIAGNOSIS — R339 Retention of urine, unspecified: Secondary | ICD-10-CM

## 2024-11-26 LAB — CBC WITH DIFFERENTIAL/PLATELET
Abs Immature Granulocytes: 0.05 10*3/uL (ref 0.00–0.07)
Basophils Absolute: 0 10*3/uL (ref 0.0–0.1)
Basophils Relative: 0 %
Eosinophils Absolute: 0.3 10*3/uL (ref 0.0–0.5)
Eosinophils Relative: 5 %
HCT: 22.9 % — ABNORMAL LOW (ref 39.0–52.0)
Hemoglobin: 7.4 g/dL — ABNORMAL LOW (ref 13.0–17.0)
Immature Granulocytes: 1 %
Lymphocytes Relative: 11 %
Lymphs Abs: 0.6 10*3/uL — ABNORMAL LOW (ref 0.7–4.0)
MCH: 31.8 pg (ref 26.0–34.0)
MCHC: 32.3 g/dL (ref 30.0–36.0)
MCV: 98.3 fL (ref 80.0–100.0)
Monocytes Absolute: 0.7 10*3/uL (ref 0.1–1.0)
Monocytes Relative: 13 %
Neutro Abs: 3.6 10*3/uL (ref 1.7–7.7)
Neutrophils Relative %: 70 %
Platelets: 226 10*3/uL (ref 150–400)
RBC: 2.33 MIL/uL — ABNORMAL LOW (ref 4.22–5.81)
RDW: 17.2 % — ABNORMAL HIGH (ref 11.5–15.5)
WBC: 5.2 10*3/uL (ref 4.0–10.5)
nRBC: 0 % (ref 0.0–0.2)

## 2024-11-26 LAB — GLUCOSE, CAPILLARY
Glucose-Capillary: 109 mg/dL — ABNORMAL HIGH (ref 70–99)
Glucose-Capillary: 166 mg/dL — ABNORMAL HIGH (ref 70–99)
Glucose-Capillary: 137 mg/dL — ABNORMAL HIGH (ref 70–99)
Glucose-Capillary: 126 mg/dL — ABNORMAL HIGH (ref 70–99)

## 2024-11-26 LAB — RENAL FUNCTION PANEL
Albumin: 2.6 g/dL — ABNORMAL LOW (ref 3.5–5.0)
Anion gap: 9 (ref 5–15)
BUN: 92 mg/dL — ABNORMAL HIGH (ref 8–23)
CO2: 22 mmol/L (ref 22–32)
Calcium: 7.8 mg/dL — ABNORMAL LOW (ref 8.9–10.3)
Chloride: 106 mmol/L (ref 98–111)
Creatinine, Ser: 4.7 mg/dL — ABNORMAL HIGH (ref 0.61–1.24)
GFR, Estimated: 12 mL/min — ABNORMAL LOW
Glucose, Bld: 113 mg/dL — ABNORMAL HIGH (ref 70–99)
Phosphorus: 2.8 mg/dL (ref 2.5–4.6)
Potassium: 5.4 mmol/L — ABNORMAL HIGH (ref 3.5–5.1)
Sodium: 136 mmol/L (ref 135–145)

## 2024-11-26 LAB — MAGNESIUM: Magnesium: 2 mg/dL (ref 1.7–2.4)

## 2024-11-26 MED ORDER — SODIUM ZIRCONIUM CYCLOSILICATE 10 G PO PACK
10.0000 g | PACK | Freq: Two times a day (BID) | ORAL | Status: AC
  Administered 2024-11-26 (×2): 10 g via ORAL
  Filled 2024-11-26 (×2): qty 1

## 2024-11-26 MED ORDER — LACTATED RINGERS IV BOLUS
500.0000 mL | Freq: Once | INTRAVENOUS | Status: AC
  Administered 2024-11-26: 500 mL via INTRAVENOUS

## 2024-11-26 NOTE — Progress Notes (Signed)
 "    Subjective: NAEON.  Patient resting in bed.  Reviewed case and plan with he and his children  Objective: Vital signs in last 24 hours: Temp:  [98.3 F (36.8 C)-99.5 F (37.5 C)] 98.9 F (37.2 C) (01/29 1220) Pulse Rate:  [78-97] 81 (01/29 1220) Resp:  [18-20] 18 (01/29 1220) BP: (95-117)/(52-55) 95/52 (01/29 1220) SpO2:  [97 %-100 %] 99 % (01/29 1220) Weight:  [92.2 kg] 92.2 kg (01/29 0500)  Assessment/Plan: # Gross hematuria-resolved # Anemia # ARF # BPH  Bilateral percutaneous nephrostomy tubes placed in IR 11/18/2024.  Clear yellow urine bilaterally.    Repeat CT A/P shows decompressed bilateral hydronephrosis.  Significant interval improvement in bladder thickening.  There is still a 3.8 x 2.5 cm fluid collection in the dome of the bladder which does not appear to be communicating.  No signs of infectious process at this time.  We will continue to monitor and reimage on an outpatient basis.  Serum creatinine stable at 4.7.  Follow-up with nephrology as directed.  Shared decision made to keep percutaneous nephrostomy tubes in place at this time until patient has an opportunity to follow-up with Dr. Watt for cystoscopy and assessment for possible surgery for bladder outlet obstruction.  PCNT exchange in approximately 4 to 6 weeks with IR.  S/p 6d ceftriaxone   Okay to discharge from urologic perspective.  Follow-up information placed in discharge instructions.   Intake/Output from previous day: 01/28 0701 - 01/29 0700 In: 35.8 [I.V.:5; IV Piggyback:20.8] Out: 700 [Urine:700]  Intake/Output this shift: Total I/O In: -  Out: 475 [Urine:475]  Physical Exam:  General: Alert and oriented CV: No cyanosis Lungs: equal chest rise Gu: foley catheter removed. Clear yellow urine in both percutaneous nephrostomy tubes.   Lab Results: Recent Labs    11/23/24 2028 11/25/24 0339 11/26/24 0337  HGB 8.2* 7.6* 7.4*  HCT 24.8* 23.8* 22.9*   BMET Recent Labs     11/25/24 0339 11/26/24 0337  NA 136 136  K 5.2* 5.4*  CL 104 106  CO2 22 22  GLUCOSE 121* 113*  BUN 91* 92*  CREATININE 4.72* 4.70*  CALCIUM  7.4* 7.8*  HGB 7.6* 7.4*  WBC 6.4 5.2     Studies/Results: CT ABDOMEN PELVIS WO CONTRAST Result Date: 11/26/2024 EXAM: CT ABDOMEN AND PELVIS WITHOUT CONTRAST 11/25/2024 06:44:50 PM TECHNIQUE: CT of the abdomen and pelvis was performed without the administration of intravenous contrast. Multiplanar reformatted images are provided for review. Automated exposure control, iterative reconstruction, and/or weight-based adjustment of the mA/kV was utilized to reduce the radiation dose to as low as reasonably achievable. COMPARISON: CT of the abdomen and pelvis dated 11/11/2024. CLINICAL HISTORY: bladder thickening, obstruction. Bladder thickening, obstruction. FINDINGS: LOWER CHEST: Mild bilateral dependent atelectasis and mild bilateral pleural effusions. Extensive calcific coronary artery disease again demonstrated. LIVER: The liver is unremarkable. GALLBLADDER AND BILE DUCTS: The gallbladder is contracted. No biliary ductal dilatation. SPLEEN: No acute abnormality. PANCREAS: No acute abnormality. ADRENAL GLANDS: No acute abnormality. KIDNEYS, URETERS AND BLADDER: Bilateral nephrostomy tubes have been placed, resulting in decompression of the previously noted bilateral hydroureteronephrosis. The ureters are no longer abnormally dilated. There is mild diffuse thickening of the wall of the urinary bladder, but it is considerably improved relative to the prior exam. There is a circumscribed fluid collection again seen abutting the dome of the urinary bladder, measuring approximately 3.84 x 2.5 cm. A foley catheter has been removed in the interim. No stones in the kidneys or ureters. No perinephric  or periureteral stranding. GI AND BOWEL: Stomach demonstrates no acute abnormality. There are few sigmoid diverticula. There is no evidence of diverticulitis. There is no  bowel obstruction. PERITONEUM AND RETROPERITONEUM: No ascites. No free air. VASCULATURE: Aorta is normal in caliber. LYMPH NODES: No lymphadenopathy. REPRODUCTIVE ORGANS: The prostate gland is enlarged. BONES AND SOFT TISSUES: No acute osseous abnormality. There is subcutaneous soft tissue stranding laterally involving the abdomen and pelvis bilaterally. IMPRESSION: 1. Bilateral nephrostomy tubes in place, resulting in decompression of the previously noted bilateral hydroureteronephrosis. The ureters are no longer abnormally dilated. 2. Mild diffuse thickening of the wall of the urinary bladder, considerably improved relative to the prior exam. 3. Circumscribed fluid collection abutting the dome of the urinary bladder, measuring approximately 3.84 x 2.5 cm. 4. Mild bilateral pleural effusions and mild dependent atelectasis. 5. Enlarged prostate gland. 6. Extensive calcific coronary artery disease. 7. Subcutaneous soft tissue stranding laterally involving the abdomen and pelvis bilaterally. Electronically signed by: Evalene Coho MD 11/26/2024 07:17 AM EST RP Workstation: [id number]   DG Chest Port 1 View Result Date: 11/26/2024 EXAM: 1 VIEW XRAY OF THE CHEST 11/26/2024 06:28:43 AM COMPARISON: 11/12/2024 CLINICAL HISTORY: Shortness of breath. FINDINGS: LINES, TUBES AND DEVICES: Left internal jugular central venous catheter removed. LUNGS AND PLEURA: There is mild hazy opacification of the lung bases bilaterally, worse on the left. The left hemidiaphragm is partially obscured. There are low lung volumes. No pleural effusion. No pneumothorax. HEART AND MEDIASTINUM: The cardiac and mediastinal silhouettes are unremarkable. There is calcification within the aortic arch. A coronary artery stent is again demonstrated. BONES AND SOFT TISSUES: No acute osseous abnormality. IMPRESSION: 1. Interval removal of the left internal jugular central venous catheter. 2. Mild hazy opacification of the lung bases bilaterally,  worse on the left, with partially obscured left hemidiaphragm and low lung volumes, favored to reflect atelectasis. Electronically signed by: Evalene Coho MD 11/26/2024 07:11 AM EST RP Workstation: [id number]       LOS: 15 days   Ole Bourdon, NP [facility] Pager: [phone]  11/26/2024, 2:15 PM  "

## 2024-11-26 NOTE — Progress Notes (Signed)
 "   Referring Physician(s): Delia Smalls, NP   Supervising Physician: Jennefer Rover  Patient Status:  [facility] - In-pt  Chief Complaint:  Urinary retention, enlarged prostate, bladder wall thickening resulting obstructive uropathy with bilateral hydronephrosis  S/p bilateral PCN placement 11/18/24 by Dr. Philip   Subjective:  Patient laying in bed, NAD. Family members at bedside.  Reports no complaints with PNC, had good sleep last night.  Patient states that he was not able to urinate at all before he came to the hospital, he decided to not pursue internalization as her concerned that it may not work for him. He would rather have PCN than Foley catheter.  Patient and family was informed about routine PCN exchange every 8-12 weeks. All questions answered.   Allergies: Patient has no known allergies.  Medications: Prior to Admission medications  Medication Sig Start Date End Date Taking? Authorizing Provider  aspirin  81 MG tablet Take 81 mg by mouth at bedtime.   Yes [provider]  atorvastatin  (LIPITOR) 80 MG tablet Take 1 tablet (80 mg total) by mouth daily. Patient taking differently: Take 80 mg by mouth at bedtime. 11/03/24  Yes Jordan, Peter M, MD  carvedilol  (COREG ) 12.5 MG tablet Take 1 tablet (12.5 mg total) by mouth 2 (two) times daily. 11/03/24 02/01/25 Yes Jordan, Peter M, MD  dapagliflozin propanediol (FARXIGA) 5 MG TABS tablet Take 5 mg by mouth at bedtime.   Yes [provider]  furosemide  (LASIX ) 20 MG tablet Take 1 tablet (20 mg total) by mouth daily. 11/03/24  Yes Jordan, Peter M, MD  losartan  (COZAAR ) 100 MG tablet Take 1 tablet (100 mg total) by mouth daily. 11/03/24  Yes Jordan, Peter M, MD  Lutein-Zeaxanthin (OCUVITE LUTEIN 25 PO) Take 1 capsule by mouth at bedtime.   Yes [provider]  Multiple Vitamins-Minerals (CENTRUM SILVER PO) Take 1 tablet by mouth daily.    Yes [provider]  nitroGLYCERIN  (NITROSTAT ) 0.4 MG SL tablet  Place 1 tablet (0.4 mg total) under the tongue every 5 (five) minutes as needed for chest pain (x 3 doses). 11/03/24  Yes Jordan, Peter M, MD  spironolactone  (ALDACTONE ) 25 MG tablet Take 1/2 tablet by mouth (12.5 mg) daily 11/03/24  Yes Jordan, Peter M, MD     Vital Signs: BP (!) 101/52   Pulse 97   Temp 99.1 F (37.3 C) (Oral)   Resp 20   Ht 5' 5 (1.651 m)   Wt 203 lb 4.2 oz (92.2 kg)   SpO2 97%   BMI 33.82 kg/m   Physical Exam Vitals reviewed.  Constitutional:      General: He is not in acute distress.    Appearance: He is not ill-appearing.  HENT:     Head: Normocephalic and atraumatic.  Pulmonary:     Effort: Pulmonary effort is normal.  Abdominal:     General: Abdomen is flat.     Palpations: Abdomen is soft.  Genitourinary:    Comments: Bilateral PCN in place. Clear urine in the collecting bag.  Musculoskeletal:     Cervical back: Neck supple.  Skin:    General: Skin is warm and dry.     Coloration: Skin is not jaundiced or pale.  Neurological:     Mental Status: He is alert and oriented to person, place, and time.  Psychiatric:        Mood and Affect: Mood normal.        Behavior: Behavior normal.  Judgment: Judgment normal.     Imaging: CT ABDOMEN PELVIS WO CONTRAST Result Date: 11/26/2024 EXAM: CT ABDOMEN AND PELVIS WITHOUT CONTRAST 11/25/2024 06:44:50 PM TECHNIQUE: CT of the abdomen and pelvis was performed without the administration of intravenous contrast. Multiplanar reformatted images are provided for review. Automated exposure control, iterative reconstruction, and/or weight-based adjustment of the mA/kV was utilized to reduce the radiation dose to as low as reasonably achievable. COMPARISON: CT of the abdomen and pelvis dated 11/11/2024. CLINICAL HISTORY: bladder thickening, obstruction. Bladder thickening, obstruction. FINDINGS: LOWER CHEST: Mild bilateral dependent atelectasis and mild bilateral pleural effusions. Extensive calcific coronary artery  disease again demonstrated. LIVER: The liver is unremarkable. GALLBLADDER AND BILE DUCTS: The gallbladder is contracted. No biliary ductal dilatation. SPLEEN: No acute abnormality. PANCREAS: No acute abnormality. ADRENAL GLANDS: No acute abnormality. KIDNEYS, URETERS AND BLADDER: Bilateral nephrostomy tubes have been placed, resulting in decompression of the previously noted bilateral hydroureteronephrosis. The ureters are no longer abnormally dilated. There is mild diffuse thickening of the wall of the urinary bladder, but it is considerably improved relative to the prior exam. There is a circumscribed fluid collection again seen abutting the dome of the urinary bladder, measuring approximately 3.84 x 2.5 cm. A foley catheter has been removed in the interim. No stones in the kidneys or ureters. No perinephric or periureteral stranding. GI AND BOWEL: Stomach demonstrates no acute abnormality. There are few sigmoid diverticula. There is no evidence of diverticulitis. There is no bowel obstruction. PERITONEUM AND RETROPERITONEUM: No ascites. No free air. VASCULATURE: Aorta is normal in caliber. LYMPH NODES: No lymphadenopathy. REPRODUCTIVE ORGANS: The prostate gland is enlarged. BONES AND SOFT TISSUES: No acute osseous abnormality. There is subcutaneous soft tissue stranding laterally involving the abdomen and pelvis bilaterally. IMPRESSION: 1. Bilateral nephrostomy tubes in place, resulting in decompression of the previously noted bilateral hydroureteronephrosis. The ureters are no longer abnormally dilated. 2. Mild diffuse thickening of the wall of the urinary bladder, considerably improved relative to the prior exam. 3. Circumscribed fluid collection abutting the dome of the urinary bladder, measuring approximately 3.84 x 2.5 cm. 4. Mild bilateral pleural effusions and mild dependent atelectasis. 5. Enlarged prostate gland. 6. Extensive calcific coronary artery disease. 7. Subcutaneous soft tissue stranding  laterally involving the abdomen and pelvis bilaterally. Electronically signed by: Evalene Coho MD 11/26/2024 07:17 AM EST RP Workstation: [id number]   DG Chest Port 1 View Result Date: 11/26/2024 EXAM: 1 VIEW XRAY OF THE CHEST 11/26/2024 06:28:43 AM COMPARISON: 11/12/2024 CLINICAL HISTORY: Shortness of breath. FINDINGS: LINES, TUBES AND DEVICES: Left internal jugular central venous catheter removed. LUNGS AND PLEURA: There is mild hazy opacification of the lung bases bilaterally, worse on the left. The left hemidiaphragm is partially obscured. There are low lung volumes. No pleural effusion. No pneumothorax. HEART AND MEDIASTINUM: The cardiac and mediastinal silhouettes are unremarkable. There is calcification within the aortic arch. A coronary artery stent is again demonstrated. BONES AND SOFT TISSUES: No acute osseous abnormality. IMPRESSION: 1. Interval removal of the left internal jugular central venous catheter. 2. Mild hazy opacification of the lung bases bilaterally, worse on the left, with partially obscured left hemidiaphragm and low lung volumes, favored to reflect atelectasis. Electronically signed by: Evalene Coho MD 11/26/2024 07:11 AM EST RP Workstation: [id number]   VAS US  LOWER EXTREMITY VENOUS (DVT) Result Date: 11/24/2024  Lower Venous DVT Study Patient Name:  [name]  Date of Exam:   11/23/2024 Medical Rec #: [id number]       Accession #:  [id number] Date of Birth: 09-21-1941        Patient Gender: M Patient Age:   66 years Exam Location:  [facility] Procedure:      VAS US  LOWER EXTREMITY VENOUS (DVT) Referring Phys: PRANAV PATEL --------------------------------------------------------------------------------  Indications: Edema.  Comparison Study: No prior study on file. Performing Technologist: Edilia Elden Appl  Examination Guidelines: A complete evaluation includes B-mode imaging, spectral Doppler, color Doppler, and power Doppler as needed of all accessible  portions of each vessel. Bilateral testing is considered an integral part of a complete examination. Limited examinations for reoccurring indications may be performed as noted. The reflux portion of the exam is performed with the patient in reverse Trendelenburg.  +---------+---------------+---------+-----------+----------+--------------+ RIGHT    CompressibilityPhasicitySpontaneityPropertiesThrombus Aging +---------+---------------+---------+-----------+----------+--------------+ CFV      Full           Yes      Yes                                 +---------+---------------+---------+-----------+----------+--------------+ SFJ      Full           Yes      Yes                                 +---------+---------------+---------+-----------+----------+--------------+ FV Prox  Full                                                        +---------+---------------+---------+-----------+----------+--------------+ FV Mid   Full                                                        +---------+---------------+---------+-----------+----------+--------------+ FV DistalFull                                                        +---------+---------------+---------+-----------+----------+--------------+ PFV      Full                                                        +---------+---------------+---------+-----------+----------+--------------+ POP      Full           Yes      Yes                                 +---------+---------------+---------+-----------+----------+--------------+ Posterior tibial vein and peroneal veins were not visualized due to wrap around bandages.  +---------+---------------+---------+-----------+----------+--------------+ LEFT     CompressibilityPhasicitySpontaneityPropertiesThrombus Aging +---------+---------------+---------+-----------+----------+--------------+ CFV      Full           Yes      Yes                                  +---------+---------------+---------+-----------+----------+--------------+  SFJ      Full           Yes      Yes                                 +---------+---------------+---------+-----------+----------+--------------+ FV Prox  Full                                                        +---------+---------------+---------+-----------+----------+--------------+ FV Mid   Full                                                        +---------+---------------+---------+-----------+----------+--------------+ FV DistalFull                                                        +---------+---------------+---------+-----------+----------+--------------+ PFV      Full                                                        +---------+---------------+---------+-----------+----------+--------------+ POP      Full           Yes      Yes                                 +---------+---------------+---------+-----------+----------+--------------+ Posterior tibial vein and peroneal veins were not visualized due to wrap around bandages.    Summary: RIGHT: - There is no evidence of deep vein thrombosis in the lower extremity. However, portions of this examination were limited- see technologist comments above.  - No cystic structure found in the popliteal fossa.  LEFT: - There is no evidence of deep vein thrombosis in the lower extremity. However, portions of this examination were limited- see technologist comments above.  - No cystic structure found in the popliteal fossa.  *See table(s) above for measurements and observations. Electronically signed by Lonni Gaskins MD on 11/24/2024 at 10:16:08 AM.    Final    US  RENAL Result Date: 11/22/2024 EXAM: RETROPERITONEAL ULTRASOUND OF THE KIDNEYS 11/22/2024 04:59:00 PM TECHNIQUE: Real-time ultrasonography of the retroperitoneum, specifically the kidneys and urinary bladder, was performed. COMPARISON: US  Renal 11/14/2024. CLINICAL HISTORY:  Acute kidney injury. ICD10: [id number] Acute kidney injury. FINDINGS: RIGHT KIDNEY: Right kidney measures 11.8 x 5.0 x 5.4 cm. Calculated volume 166 ml is normal. Mild hydronephrosis is noted. Nephrostomy catheter is noted in place. LEFT KIDNEY: Left kidney measures 11.8 x 4.9 x 4.9 cm. The calculated volume is 148 ml. Mild hydronephrosis is noted. The nephrostomy catheter is noted. BLADDER: The bladder is decompressed. Note is made of bilateral pleural effusions. IMPRESSION: 1. Bilateral mild hydronephrosis with nephrostomy catheters in place. 2. Bilateral pleural effusions. Electronically signed by: Oneil Devonshire MD  11/22/2024 06:20 PM EST RP Workstation: [id number]    Labs:  CBC: Recent Labs    11/23/24 0233 11/23/24 2028 11/25/24 0339 11/26/24 0337  WBC 7.7 8.9 6.4 5.2  HGB 7.8* 8.2* 7.6* 7.4*  HCT 24.4* 24.8* 23.8* 22.9*  PLT 176 210 220 226    COAGS: Recent Labs    11/14/24 0447  INR 1.3*    BMP: Recent Labs    11/23/24 0233 11/24/24 0347 11/25/24 0339 11/26/24 0337  NA 135 136 136 136  K 4.8 4.5 5.2* 5.4*  CL 106 106 104 106  CO2 19* 21* 22 22  GLUCOSE 122* 101* 121* 113*  BUN 83* 85* 91* 92*  CALCIUM  7.3* 7.6* 7.4* 7.8*  CREATININE 5.20* 5.01* 4.72* 4.70*  GFRNONAA 10* 11* 12* 12*    LIVER FUNCTION TESTS: Recent Labs    11/11/24 1425 11/11/24 1643 11/17/24 0156 11/22/24 1420 11/22/24 1821 11/23/24 0233 11/24/24 0347 11/25/24 0339 11/26/24 0337  BILITOT 0.3 0.2  --  0.3  --   --   --   --   --   AST 19 19  --  30  --   --   --   --   --   ALT 20 18  --  31  --   --   --   --   --   ALKPHOS 78 62  --  71  --   --   --   --   --   PROT 5.7* 4.9*  --  4.3*  --   --   --   --   --   ALBUMIN  2.5* 2.2*   < > 2.1*   < > 2.6* 2.6* 2.4* 2.6*   < > = values in this interval not displayed.    Assessment and Plan:  84 y.o. male with enlarged prostate, bladder wall thickening resulting obstructive uropathy with bilateral hydronephrosis; S/p bilateral PCN  placement 11/18/24 by Dr. Philip.   VSS, BP has been soft  Hgb trending down, 7.4 this morning. No gross hematuria, clear urine in both collecting bag. Urology following, patient will be scheduled for TURP with possible retrograde bilateral DJ stent placement as outpatient.  PLAN - PCN exchange ordered, patient will receive a call from radiology schedulers.  - Further treatment plan per [facility]/urology.  - Appreciate and agree with the plan.  - Please call IR for questions and concerns.    Electronically Signed: Toya VEAR Cousin, PA-C 11/26/2024, 8:42 AM   I spent a total of 15 Minutes at the the patient's bedside AND on the patient's hospital floor or unit, greater than 50% of which was counseling/coordinating care for bilateral PCN f/u.   This chart was dictated using voice recognition software.  Despite best efforts to proofread,  errors can occur which can change the documentation meaning.   "

## 2024-11-26 NOTE — Progress Notes (Signed)
 Nutrition Follow-up  DOCUMENTATION CODES:   Non-severe (moderate) malnutrition in context of acute illness/injury  INTERVENTION:   Magic cup BID with meals, each supplement provides 290 kcal and 9 grams of protein   Added higher protein snacks in between meals.  D/c Nepro Shakes  D/c Prosource Plus   NUTRITION DIAGNOSIS:   Moderate Malnutrition related to acute illness as evidenced by mild fat depletion, mild muscle depletion.  Remains applicable  GOAL:   Patient will meet greater than or equal to 90% of their needs  Progressing   MONITOR:   PO intake, Labs, Weight trends, I & O's  REASON FOR ASSESSMENT:   Consult Assessment of nutrition requirement/status  ASSESSMENT:   84 y.o. male presented to the ED with weakness, fatigue, and decreased appetite/PO. PMH includes CVA, CHF, HLD, HTN, DM, and CAD. Pt admitted with severe AKI on CKD 4, shock, and UTI.  1/19 KUB showed dilated loops of small bowel, consistent with partial SBO. Medical team monitoring; currently no clinical signs of obstruction.  1/21- s/p Placement of bilateral nephrostomy tubes 1/22- 1 Unit PRBC transfused 1/24- 1 unit PRBC transfused  1/25- US  renal: bilateral hydronephosis/ mild PE  1/28-  nephrology signed off   Discharge planning with family deciding on a SNF. Decision made to keep PCNT in place upon d/c. Renal function has improved with nephology signing off.  Spoke with pt and family at bedside. Pt appetite still seems to be poor due to dysgeusia. Pt reported that foods he used to love he is not able to tolerate. Also endorses food aversion of animal proteins. Pt has tried various ONS shakes and does not care for them. He is eager to eat and build his strength up and his family is very supportive. States that he can tolerate milk and vanilla/berry magic cup. Will discontinue Nepro and ProSource supplements due to pt preference.   Discussed the importance of protein and calories to promote  recovery, wound healing, and prevent muscle wasting. Brainstormed ways with the family to increase protein in tolerable ways as the pt prefers savory to sweet. He is amicable to adding snacks in between meals.  Pt reported a UBW of 75 kg. Notable wt discrepancy overnight between wt taken 1/18 to 1/19. Also noted to have been transferred out of ICU at this time. Likely related to scale calibration or method of wt collection. Will monitor trend. Some mild to moderate edema on exam. May be skewing wt trend or masking further muscle and fat depletion   Potassium running high, requiring Lokelma  yesterday and today. Started appetite stimulate 1/17.   Admit weight: 72.6 kg Current weight: 92.2 kg    Average Meal Intake: 1/22-1/25: 75% intake x 4 recorded meals  Nutritionally Relevant Medications: Scheduled Meds:  feeding supplement (NEPRO CARB STEADY)  237 mL Oral TID BM   feeding supplement (PROSource TF20)  60 mL Per Tube Daily   insulin  aspart  0-9 Units Subcutaneous TID WC   multivitamin  1 tablet Oral QHS   pantoprazole   40 mg Oral BID   saccharomyces boulardii  250 mg Oral BID   simethicone   80 mg Oral QID   sodium bicarbonate   1,300 mg Oral BID   sodium zirconium cyclosilicate   10 g Oral BID   PRN Meds: ondansetron  (ZOFRAN ) IV  Labs Reviewed: K: 5.4 BUN: 85>91>92 Creatinine: 5.01>4.72>4.70 Ca: 7.8 GFR: 12  CBG ranges from 92-164 mg/dL over the last 24 hours HgbA1c 6.4     Diet Order:  Diet Order             Diet regular Room service appropriate? Yes; Fluid consistency: Thin  Diet effective now                   EDUCATION NEEDS:   Education needs have been addressed  Skin:  Skin Assessment: Reviewed RN Assessment (DTI: head, back; Stage 2: Coccyx)  Last BM:  1/28- type 6  Height:   Ht Readings from Last 1 Encounters:  11/11/24 5' 5 (1.651 m)    Weight:   Wt Readings from Last 1 Encounters:  11/26/24 92.2 kg    Ideal Body Weight:  61.8 kg  BMI:   Body mass index is 33.82 kg/m.  Estimated Nutritional Needs:   Kcal:  1650-1850  Protein:  80-100 grams  Fluid:  >/= 1.5 L    Con Friends, Dietetic Intern

## 2024-11-26 NOTE — Progress Notes (Signed)
 [facility] Progress Note Patient: [name] FMW:[id number] DOB: September 22, 1941  DOA: 11/11/2024 DOS: the patient was seen and examined on 11/26/2024  Brief Summary: Patient is a 84 y.o.  male with history of CAD s/p PCI 2008, HFmrEF, HTN, HLD-presented with generalized weakness/fatigue-loss of appetite-found to be hypotensive with AKI-thought to have obstructive uropathy.  Initially admitted to the ICU for pressors-stabilized and subsequently transferred to [facility].   Significant events: 1/14>> admit to ICU 1/16>> received 1 unit PRBC. 1/19>> transferred to [facility]. 1/21>> bilateral PCN performed. 1/22>> 1 unit PRBC given. 1/24>> 1 more PRBC given 1/25>> US  renal mild bilateral hydronephrosis mild pleural effusion.  Significant studies: 1/14>> CXR: No PNA 1/14>> CT head: No acute intracranial process 1/14>> CT abdomen/pelvis: Severe bilateral hydronephrosis 1/14>> renal ultrasound: Severe bilateral hydronephrosis. 1/15>> echo: EF 55-60% 1/17>> renal ultrasound: Severe bilateral hydronephrosis 1/19>> x-ray KUB: Dilated loops of small bowel-at least consistent with a partial SBO. Pete CT scan 11/26/2024.  Shows bilateral nephrostomy tubes, decompressed ureters, nonspecific thickening in the urinary bladder   Significant microbiology data: 1/14>> COVID/influenza/RSV PCR: Negative 1/14>> blood culture: No growth 1/15>> urine culture: No growth   Consults: PCCM Nephrology Neurology IR   Assessment and plan:  AKI Secondary to obstructive uropathy Presents with decreased urine output.  Found to have AKI with hyperkalemia upon admission.  Concern for infection.  Admitted to the ICU for vasopressor. Foley catheter in place to treat hypertension. Urology consulted, IR and following. Nephrology seen the patient and currently signed off Given that the renal function did not improve and continues to have hydronephrosis IR placed bilateral PCN. Continue to hydrate with IV fluids as  needed. Pete CT scan noted on 11/26/2024, urology following.  Defer management of urological issues to urology.     Hyperkalemia Lokelma .   Metabolic acidosis Due to AKI on oral bicarb   Multifactorial shock Secondary to septic shock due to UTI and hypovolemic shock from poor oral intake BP has stabilized with supportive care Briefly required pressors on initial presentation Blood pressure remains soft, IV fluids on 11/25/2024 Continue midodrine  and monitor.  Symptom-free appears nontoxic   Complicated UTI Treated with Rocephin -although urine cultures negative-this was done after patient was given antibiotics   Ileus resolved. Patient with significant distention of his bowels. No definitive obstruction seen. Actually has a bowel movement and passing gas. Repeat x-ray on 1/23 shows improvement in ileus.   GERD Dyspeptic symptoms are much better Continue PPI As needed simethicone .   Normocytic anemia Acute blood loss anemia Likely secondary to critical illness-has some hematuria after PCN placement. Has received total 3 units of packed RBC transfusion last on 11/21/2024 Holding aspirin  and heparin  for DVT prophylaxis. Follow CBC, type screen done will follow closely   History of CAD s/p PCI 2008 No anginal symptoms   Chronic HFmrEF Euvolemic Diuretics on hold   HTN Blood pressure low currently on IV fluids and midodrine     Anorexia/failure to thrive syndrome Poor oral intake for the past several months Patient not keen on getting NG tube placed Thankfully slowly improving-continue to encourage oral intake-continue supplements Remains on Remeron  Palliative care following.   Moderate Malnutrition Class I obesity. Body mass index is 30.96 kg/m.  Etiology: acute illness Signs/Symptoms: mild fat depletion, mild muscle depletion Interventions: Refer to RD note for recommendations Placing the patient at high risk for outcome.  Stage II coccyx ulcer, upper back  pressure ulcers. Present on admission. Monitor.  Hematuria. Bilateral PCN had bloody discharge.  Now resolved.  IR consulted to reevaluate the PCN.  Recommended continue irrigation. Monitor H&H.  Hypoalbuminemia with third spacing. Albumin  level is 2. Has lower extremity swelling.  Bilateral pleural effusion. Suspect this is third spacing related to hypoalbuminemia. Will eventually need to resume diuresis.  Type of diabetes mellitus, well-controlled without long-term insulin  use with CKD. Currently on sliding scale insulin .  Lab Results  Component Value Date   HGBA1C 6.4 (H) 11/12/2024    CBG (last 3)  Recent Labs    11/25/24 1617 11/25/24 2117 11/26/24 0813  GLUCAP 92 142* 109*    Goals of care conversation. 1/25 extensive discussion with patient at bedside.  Daughter and son at bedside as well. Patient feels that the way he is feeling right now he would like to go through an attempt of resuscitation understanding that there is a very high risk of his course after resuscitation may not be without any significant complications. Patient does not want to be on lifelong hemodialysis.  Patient also knows that he would likely be not a candidate for lifelong hemodialysis. Patient would like to attempt short-term ICU stay should he need 1.    DVT Prophylaxis: Place and maintain sequential compression device Start: 11/21/24 1110 SCDs Start: 11/11/24 1516   Data review I have Reviewed nursing notes, Vitals, and Lab results. Since last encounter, pertinent lab results CBC and BMP   . I have ordered test including CBC and BMP  . I have discussed pt's care plan and test results with urology  .    Physical exam. Vitals:   11/25/24 2352 11/26/24 0400 11/26/24 0500 11/26/24 0812  BP: (!) 106/53   (!) 101/52  Pulse:    97  Resp:    20  Temp: 98.3 F (36.8 C) 99 F (37.2 C)  99.1 F (37.3 C)  TempSrc: Axillary Oral  Oral  SpO2:    97%  Weight:   92.2 kg   Height:        Awake Alert, No new F.N deficits, Normal affect [place].AT,PERRAL Supple Neck, No JVD,   Symmetrical Chest wall movement, Good air movement bilaterally, CTAB RRR,No Gallops, Rubs or new Murmurs,  +ve B.Sounds, Abd Soft, No tenderness, bilateral nephrostomy tubes in place No Cyanosis, Clubbing or edema    Subjective: Patient in bed, appears comfortable, denies any headache, no fever, no chest pain or pressure, no shortness of breath , no abdominal pain. No focal weakness.   Family Communication: daughter at bedside 11/25/24.  11/26/2024  Disposition Plan: Status is: Inpatient Remains inpatient appropriate because: Monitor for improvement in renal function.   Planned Discharge Destination: TBD Diet: Diet Order             Diet regular Room service appropriate? Yes; Fluid consistency: Thin  Diet effective now                    Data Review:   Patient Lines/Drains/Airways Status     Active Line/Drains/Airways     Name Placement date Placement time Site Days   Peripheral IV 11/20/24 22 G 1 Anterior;Left Forearm 11/20/24  1209  Forearm  6   Nephrostomy Left 10.2 Fr. 11/18/24  1006  Left  8   Nephrostomy Right 10.2 Fr. 11/18/24  1011  Right  8   Wound 11/11/24 1850 Pressure Injury Coccyx Mid Stage 2 -  Partial thickness loss of dermis presenting as a shallow open injury with a red, pink wound bed without slough. 11/11/24  1850  Coccyx  15   Wound 11/11/24 1850 Pressure Injury Back Mid;Upper Deep Tissue Pressure Injury - Purple or maroon localized area of discolored intact skin or blood-filled blister due to damage of underlying soft tissue from pressure and/or shear. 11/11/24  1850  Back  15   Wound 11/11/24 1850 Pressure Injury Head Posterior;Medial Deep Tissue Pressure Injury - Purple or maroon localized area of discolored intact skin or blood-filled blister due to damage of underlying soft tissue from pressure and/or shear. 11/11/24  1850  Head  15             Inpatient  Medications  Scheduled Meds:  Chlorhexidine  Gluconate Cloth  6 each Topical Daily   feeding supplement (NEPRO CARB STEADY)  237 mL Oral TID BM   feeding supplement (PROSource TF20)  60 mL Per Tube Daily   Gerhardt's butt cream  1 Application Topical TID   insulin  aspart  0-9 Units Subcutaneous TID WC   midodrine   15 mg Oral TID WC   mirtazapine   15 mg Oral QHS   multivitamin  1 tablet Oral QHS   pantoprazole   40 mg Oral BID   saccharomyces boulardii  250 mg Oral BID   simethicone   80 mg Oral QID   sodium bicarbonate   1,300 mg Oral BID   sodium chloride  flush  5 mL Intracatheter Q12H   sodium zirconium cyclosilicate   10 g Oral BID   Continuous Infusions:   PRN Meds:.acetaminophen , ondansetron  (ZOFRAN ) IV, polyethylene glycol  DVT Prophylaxis  Place and maintain sequential compression device Start: 11/21/24 1110 SCDs Start: 11/11/24 1516   Recent Labs  Lab 11/22/24 1821 11/23/24 0233 11/23/24 2028 11/25/24 0339 11/26/24 0337  WBC 8.3 7.7 8.9 6.4 5.2  HGB 9.0* 7.8* 8.2* 7.6* 7.4*  HCT 28.1* 24.4* 24.8* 23.8* 22.9*  PLT 209 176 210 220 226  MCV 98.9 98.4 97.3 98.3 98.3  MCH 31.7 31.5 32.2 31.4 31.8  MCHC 32.0 32.0 33.1 31.9 32.3  RDW 18.5* 18.5* 18.0* 17.5* 17.2*  LYMPHSABS  --   --   --   --  0.6*  MONOABS  --   --   --   --  0.7  EOSABS  --   --   --   --  0.3  BASOSABS  --   --   --   --  0.0    Recent Labs  Lab 11/21/24 2335 11/22/24 0609 11/22/24 1420 11/22/24 1821 11/23/24 0233 11/24/24 0347 11/25/24 0339 11/26/24 0337  NA 135   < >  --  135 135 136 136 136  K 5.4*   < >  --  5.4* 4.8 4.5 5.2* 5.4*  CL 107   < >  --  107 106 106 104 106  CO2 21*   < >  --  21* 19* 21* 22 22  ANIONGAP 8   < >  --  8 10 9 9 9   GLUCOSE 121*   < >  --  116* 122* 101* 121* 113*  BUN 91*   < >  --  86* 83* 85* 91* 92*  CREATININE 5.21*   < >  --  5.30* 5.20* 5.01* 4.72* 4.70*  AST  --   --  30  --   --   --   --   --   ALT  --   --  31  --   --   --   --   --    ALKPHOS  --   --  71  --   --   --   --   --   BILITOT  --   --  0.3  --   --   --   --   --   ALBUMIN   --    < > 2.1* 2.1* 2.6* 2.6* 2.4* 2.6*  LATICACIDVEN  --   --  2.2*  --   --   --   --   --   TSH  --   --  4.520*  --   --   --   --   --   MG 1.6*  --   --   --  1.8 1.7 1.6* 2.0  PHOS  --    < >  --  2.4* 2.3* 2.5 2.3* 2.8  CALCIUM  7.2*   < >  --  7.4* 7.3* 7.6* 7.4* 7.8*   < > = values in this interval not displayed.      Recent Labs  Lab 11/21/24 2335 11/22/24 0609 11/22/24 1420 11/22/24 1821 11/23/24 0233 11/24/24 0347 11/25/24 0339 11/26/24 9662  LATICACIDVEN  --   --  2.2*  --   --   --   --   --   TSH  --   --  4.520*  --   --   --   --   --   MG 1.6*  --   --   --  1.8 1.7 1.6* 2.0  CALCIUM  7.2*   < >  --  7.4* 7.3* 7.6* 7.4* 7.8*   < > = values in this interval not displayed.    --------------------------------------------------------------------------------------------------------------- Lab Results  Component Value Date   CHOL 101 03/07/2017   HDL 46 03/07/2017   LDLCALC 43 03/07/2017   TRIG 59 03/07/2017   CHOLHDL 2.2 03/07/2017    Lab Results  Component Value Date   HGBA1C 6.4 (H) 11/12/2024   No results for input(s): TSH, T4TOTAL, FREET4, T3FREE, THYROIDAB in the last 72 hours.  No results for input(s): VITAMINB12, FOLATE, FERRITIN, TIBC, IRON, RETICCTPCT in the last 72 hours. ------------------------------------------------------------------------------------------------------------------ Cardiac Enzymes No results for input(s): CKMB, TROPONINI, MYOGLOBIN in the last 168 hours.  Invalid input(s): CK  Micro Results No results found for this or any previous visit (from the past 240 hours).  Radiology Reports  CT ABDOMEN PELVIS WO CONTRAST Result Date: 11/26/2024 EXAM: CT ABDOMEN AND PELVIS WITHOUT CONTRAST 11/25/2024 06:44:50 PM TECHNIQUE: CT of the abdomen and pelvis was performed without the administration  of intravenous contrast. Multiplanar reformatted images are provided for review. Automated exposure control, iterative reconstruction, and/or weight-based adjustment of the mA/kV was utilized to reduce the radiation dose to as low as reasonably achievable. COMPARISON: CT of the abdomen and pelvis dated 11/11/2024. CLINICAL HISTORY: bladder thickening, obstruction. Bladder thickening, obstruction. FINDINGS: LOWER CHEST: Mild bilateral dependent atelectasis and mild bilateral pleural effusions. Extensive calcific coronary artery disease again demonstrated. LIVER: The liver is unremarkable. GALLBLADDER AND BILE DUCTS: The gallbladder is contracted. No biliary ductal dilatation. SPLEEN: No acute abnormality. PANCREAS: No acute abnormality. ADRENAL GLANDS: No acute abnormality. KIDNEYS, URETERS AND BLADDER: Bilateral nephrostomy tubes have been placed, resulting in decompression of the previously noted bilateral hydroureteronephrosis. The ureters are no longer abnormally dilated. There is mild diffuse thickening of the wall of the urinary bladder, but it is considerably improved relative to the prior exam. There is a circumscribed fluid collection again seen abutting the dome of the urinary bladder, measuring approximately 3.84 x 2.5 cm. A  foley catheter has been removed in the interim. No stones in the kidneys or ureters. No perinephric or periureteral stranding. GI AND BOWEL: Stomach demonstrates no acute abnormality. There are few sigmoid diverticula. There is no evidence of diverticulitis. There is no bowel obstruction. PERITONEUM AND RETROPERITONEUM: No ascites. No free air. VASCULATURE: Aorta is normal in caliber. LYMPH NODES: No lymphadenopathy. REPRODUCTIVE ORGANS: The prostate gland is enlarged. BONES AND SOFT TISSUES: No acute osseous abnormality. There is subcutaneous soft tissue stranding laterally involving the abdomen and pelvis bilaterally. IMPRESSION: 1. Bilateral nephrostomy tubes in place, resulting in  decompression of the previously noted bilateral hydroureteronephrosis. The ureters are no longer abnormally dilated. 2. Mild diffuse thickening of the wall of the urinary bladder, considerably improved relative to the prior exam. 3. Circumscribed fluid collection abutting the dome of the urinary bladder, measuring approximately 3.84 x 2.5 cm. 4. Mild bilateral pleural effusions and mild dependent atelectasis. 5. Enlarged prostate gland. 6. Extensive calcific coronary artery disease. 7. Subcutaneous soft tissue stranding laterally involving the abdomen and pelvis bilaterally. Electronically signed by: Evalene Coho MD 11/26/2024 07:17 AM EST RP Workstation: [id number]   DG Chest Port 1 View Result Date: 11/26/2024 EXAM: 1 VIEW XRAY OF THE CHEST 11/26/2024 06:28:43 AM COMPARISON: 11/12/2024 CLINICAL HISTORY: Shortness of breath. FINDINGS: LINES, TUBES AND DEVICES: Left internal jugular central venous catheter removed. LUNGS AND PLEURA: There is mild hazy opacification of the lung bases bilaterally, worse on the left. The left hemidiaphragm is partially obscured. There are low lung volumes. No pleural effusion. No pneumothorax. HEART AND MEDIASTINUM: The cardiac and mediastinal silhouettes are unremarkable. There is calcification within the aortic arch. A coronary artery stent is again demonstrated. BONES AND SOFT TISSUES: No acute osseous abnormality. IMPRESSION: 1. Interval removal of the left internal jugular central venous catheter. 2. Mild hazy opacification of the lung bases bilaterally, worse on the left, with partially obscured left hemidiaphragm and low lung volumes, favored to reflect atelectasis. Electronically signed by: Evalene Coho MD 11/26/2024 07:11 AM EST RP Workstation: [id number]      Signature  -   Lavada Stank M.D on 11/26/2024 at 10:26 AM   -  To page go to [email or web address]

## 2024-11-26 NOTE — Plan of Care (Signed)
" °  Problem: Education: Goal: Knowledge of General Education information will improve Description: Including pain rating scale, medication(s)/side effects and non-pharmacologic comfort measures Outcome: Progressing   Problem: Clinical Measurements: Goal: Ability to maintain clinical measurements within normal limits will improve Outcome: Progressing Goal: Will remain free from infection Outcome: Progressing   Problem: Nutrition: Goal: Adequate nutrition will be maintained Outcome: Progressing   Problem: Pain Managment: Goal: General experience of comfort will improve and/or be controlled Outcome: Progressing   Problem: Safety: Goal: Ability to remain free from injury will improve Outcome: Progressing   Problem: Fluid Volume: Goal: Ability to maintain a balanced intake and output will improve Outcome: Progressing   Problem: Metabolic: Goal: Ability to maintain appropriate glucose levels will improve Outcome: Progressing   "

## 2024-11-27 DIAGNOSIS — N170 Acute kidney failure with tubular necrosis: Secondary | ICD-10-CM | POA: Diagnosis not present

## 2024-11-27 LAB — T4, FREE: Free T4: 1.03 ng/dL (ref 0.80–2.00)

## 2024-11-27 LAB — CBC WITH DIFFERENTIAL/PLATELET
Abs Immature Granulocytes: 0.05 10*3/uL (ref 0.00–0.07)
Basophils Absolute: 0 10*3/uL (ref 0.0–0.1)
Basophils Relative: 1 %
Eosinophils Absolute: 0.4 10*3/uL (ref 0.0–0.5)
Eosinophils Relative: 9 %
HCT: 21.7 % — ABNORMAL LOW (ref 39.0–52.0)
Hemoglobin: 7 g/dL — ABNORMAL LOW (ref 13.0–17.0)
Immature Granulocytes: 1 %
Lymphocytes Relative: 14 %
Lymphs Abs: 0.6 10*3/uL — ABNORMAL LOW (ref 0.7–4.0)
MCH: 31.8 pg (ref 26.0–34.0)
MCHC: 32.3 g/dL (ref 30.0–36.0)
MCV: 98.6 fL (ref 80.0–100.0)
Monocytes Absolute: 0.5 10*3/uL (ref 0.1–1.0)
Monocytes Relative: 13 %
Neutro Abs: 2.7 10*3/uL (ref 1.7–7.7)
Neutrophils Relative %: 62 %
Platelets: 230 10*3/uL (ref 150–400)
RBC: 2.2 MIL/uL — ABNORMAL LOW (ref 4.22–5.81)
RDW: 17 % — ABNORMAL HIGH (ref 11.5–15.5)
WBC: 4.2 10*3/uL (ref 4.0–10.5)
nRBC: 0 % (ref 0.0–0.2)

## 2024-11-27 LAB — MAGNESIUM: Magnesium: 1.9 mg/dL (ref 1.7–2.4)

## 2024-11-27 LAB — TSH: TSH: 4.42 u[IU]/mL (ref 0.350–4.500)

## 2024-11-27 LAB — RENAL FUNCTION PANEL
Albumin: 2.2 g/dL — ABNORMAL LOW (ref 3.5–5.0)
Anion gap: 12 (ref 5–15)
BUN: 95 mg/dL — ABNORMAL HIGH (ref 8–23)
CO2: 19 mmol/L — ABNORMAL LOW (ref 22–32)
Calcium: 7.6 mg/dL — ABNORMAL LOW (ref 8.9–10.3)
Chloride: 103 mmol/L (ref 98–111)
Creatinine, Ser: 4.55 mg/dL — ABNORMAL HIGH (ref 0.61–1.24)
GFR, Estimated: 12 mL/min — ABNORMAL LOW
Glucose, Bld: 93 mg/dL (ref 70–99)
Phosphorus: 3.5 mg/dL (ref 2.5–4.6)
Potassium: 5.6 mmol/L — ABNORMAL HIGH (ref 3.5–5.1)
Sodium: 134 mmol/L — ABNORMAL LOW (ref 135–145)

## 2024-11-27 LAB — GLUCOSE, CAPILLARY
Glucose-Capillary: 94 mg/dL (ref 70–99)
Glucose-Capillary: 147 mg/dL — ABNORMAL HIGH (ref 70–99)
Glucose-Capillary: 104 mg/dL — ABNORMAL HIGH (ref 70–99)
Glucose-Capillary: 107 mg/dL — ABNORMAL HIGH (ref 70–99)

## 2024-11-27 LAB — PREPARE RBC (CROSSMATCH)

## 2024-11-27 LAB — CORTISOL: Cortisol, Plasma: 14.1 ug/dL

## 2024-11-27 MED ORDER — FUROSEMIDE 10 MG/ML IJ SOLN
40.0000 mg | Freq: Once | INTRAMUSCULAR | Status: AC
  Administered 2024-11-27: 40 mg via INTRAVENOUS
  Filled 2024-11-27: qty 4

## 2024-11-27 MED ORDER — SODIUM ZIRCONIUM CYCLOSILICATE 10 G PO PACK
10.0000 g | PACK | Freq: Two times a day (BID) | ORAL | Status: AC
  Administered 2024-11-27 (×2): 10 g via ORAL
  Filled 2024-11-27 (×2): qty 1

## 2024-11-27 MED ORDER — SODIUM ZIRCONIUM CYCLOSILICATE 10 G PO PACK: 10.0000 g | PACK | Freq: Every day | ORAL | Status: DC

## 2024-11-27 MED ORDER — SODIUM CHLORIDE 0.9% IV SOLUTION: Freq: Once | INTRAVENOUS | Status: AC

## 2024-11-27 NOTE — Progress Notes (Addendum)
 [facility] NEPHROLOGY PROGRESS NOTE  Assessment/ Plan: Pt is a 84 y.o. yo male with past medical history significant for HTN, HLD, CHF with preserved EF, CAD status post stent, seen by our team for AKI thought to be due to obstructive uropathy, signed off on 1/24, called us  back by primary team because of intermittent hyperkalemia and persistent AKI.  # Acute kidney injury due to obstructive uropathy: Patient was admitted to ICU for shock requiring pressor in the beginning, may have some component of ATN as well.  Seen by urology and IR, status post bilateral nephrostomy tube placement.  Patient is nonoliguric.  The peak creatinine level was 14 on admission and now it is holding around 4-5.  No overt uremic symptoms except generalized weakness.  Agree with blood transfusion, supportive care.  We will manage hyperkalemia with Lokelma .  Patient does not want to be on dialysis, verified with his daughter and son who were presented at bedside.  Fortunately, no acute indication for dialysis at this time. -Continue strict ins and outs and daily.  # Hyperkalemia due to low GFR, requirement of blood products excetra.  Receiving IV Lasix  with blood transfusion today.  Also receiving Lokelma .  We will start daily Lokelma  to help with hyperkalemia.  Recommend renal diet.  # Anemia due to acute critical illness, no sign of bleeding: Receiving blood transfusion, no ESA.  # Shock: Due to UTI and hypovolemia.  Blood pressure improved. On midodrine  now.  Continue to monitor.  # Metabolic acidosis: Continue sodium bicarbonate , follow serum CO2 level.  Thank you for the reconsult.  Discussed with the patient's son and daughter at bedside as well.  We will continue to follow.  Subjective: Seen and examined.  Patient reports generalized weakness and in the process of getting a blood transfusion.  Urine output documented around 1.4 L via nephrostomy tube.  No nausea, dysphagia, chest pain or shortness  of breath.  Family members at bedside. Objective Vital signs in last 24 hours: Vitals:   11/27/24 0500 11/27/24 0746 11/27/24 1045 11/27/24 1100  BP:  (!) 104/56 (!) 112/58 (!) 110/54  Pulse:  76 91 93  Resp:  14 20 20   Temp:  98.5 F (36.9 C) 98 F (36.7 C) 98.6 F (37 C)  TempSrc:  Axillary Oral Oral  SpO2:  96% 96% 92%  Weight: 90.3 kg     Height:       Weight change: -1.9 kg  Intake/Output Summary (Last 24 hours) at 11/27/2024 1309 Last data filed at 11/27/2024 0500 Gross per 24 hour  Intake --  Output 875 ml  Net -875 ml       Labs: RENAL PANEL Recent Labs  Lab 11/23/24 0233 11/24/24 0347 11/25/24 0339 11/26/24 0337 11/27/24 0559  NA 135 136 136 136 134*  K 4.8 4.5 5.2* 5.4* 5.6*  CL 106 106 104 106 103  CO2 19* 21* 22 22 19*  GLUCOSE 122* 101* 121* 113* 93  BUN 83* 85* 91* 92* 95*  CREATININE 5.20* 5.01* 4.72* 4.70* 4.55*  CALCIUM  7.3* 7.6* 7.4* 7.8* 7.6*  MG 1.8 1.7 1.6* 2.0 1.9  PHOS 2.3* 2.5 2.3* 2.8 3.5  ALBUMIN  2.6* 2.6* 2.4* 2.6* 2.2*    Liver Function Tests: Recent Labs  Lab 11/22/24 1420 11/22/24 1821 11/25/24 0339 11/26/24 0337 11/27/24 0559  AST 30  --   --   --   --   ALT 31  --   --   --   --  ALKPHOS 71  --   --   --   --   BILITOT 0.3  --   --   --   --   PROT 4.3*  --   --   --   --   ALBUMIN  2.1*   < > 2.4* 2.6* 2.2*   < > = values in this interval not displayed.   No results for input(s): LIPASE, AMYLASE in the last 168 hours. No results for input(s): AMMONIA in the last 168 hours. CBC: Recent Labs    11/11/24 1643 11/12/24 0500 11/23/24 0233 11/23/24 2028 11/25/24 0339 11/26/24 0337 11/27/24 0559  HGB 7.7*   < > 7.8* 8.2* 7.6* 7.4* 7.0*  MCV 97.9   < > 98.4 97.3 98.3 98.3 98.6  VITAMINB12 1,349*  --   --   --   --   --   --   FOLATE 7.7  --   --   --   --   --   --   FERRITIN 938*  --   --   --   --   --   --   TIBC 121*  --   --   --   --   --   --   IRON 102  --   --   --   --   --   --   RETICCTPCT  2.8  --   --   --   --   --   --    < > = values in this interval not displayed.    Cardiac Enzymes: No results for input(s): CKTOTAL, CKMB, CKMBINDEX, TROPONINI in the last 168 hours. CBG: Recent Labs  Lab 11/26/24 1219 11/26/24 1619 11/26/24 2044 11/27/24 0745 11/27/24 1155  GLUCAP 166* 137* 126* 94 147*    Iron Studies: No results for input(s): IRON, TIBC, TRANSFERRIN, FERRITIN in the last 72 hours. Studies/Results: CT ABDOMEN PELVIS WO CONTRAST Result Date: 11/26/2024 EXAM: CT ABDOMEN AND PELVIS WITHOUT CONTRAST 11/25/2024 06:44:50 PM TECHNIQUE: CT of the abdomen and pelvis was performed without the administration of intravenous contrast. Multiplanar reformatted images are provided for review. Automated exposure control, iterative reconstruction, and/or weight-based adjustment of the mA/kV was utilized to reduce the radiation dose to as low as reasonably achievable. COMPARISON: CT of the abdomen and pelvis dated 11/11/2024. CLINICAL HISTORY: bladder thickening, obstruction. Bladder thickening, obstruction. FINDINGS: LOWER CHEST: Mild bilateral dependent atelectasis and mild bilateral pleural effusions. Extensive calcific coronary artery disease again demonstrated. LIVER: The liver is unremarkable. GALLBLADDER AND BILE DUCTS: The gallbladder is contracted. No biliary ductal dilatation. SPLEEN: No acute abnormality. PANCREAS: No acute abnormality. ADRENAL GLANDS: No acute abnormality. KIDNEYS, URETERS AND BLADDER: Bilateral nephrostomy tubes have been placed, resulting in decompression of the previously noted bilateral hydroureteronephrosis. The ureters are no longer abnormally dilated. There is mild diffuse thickening of the wall of the urinary bladder, but it is considerably improved relative to the prior exam. There is a circumscribed fluid collection again seen abutting the dome of the urinary bladder, measuring approximately 3.84 x 2.5 cm. A foley catheter has been removed  in the interim. No stones in the kidneys or ureters. No perinephric or periureteral stranding. GI AND BOWEL: Stomach demonstrates no acute abnormality. There are few sigmoid diverticula. There is no evidence of diverticulitis. There is no bowel obstruction. PERITONEUM AND RETROPERITONEUM: No ascites. No free air. VASCULATURE: Aorta is normal in caliber. LYMPH NODES: No lymphadenopathy. REPRODUCTIVE ORGANS: The prostate gland is enlarged. BONES  AND SOFT TISSUES: No acute osseous abnormality. There is subcutaneous soft tissue stranding laterally involving the abdomen and pelvis bilaterally. IMPRESSION: 1. Bilateral nephrostomy tubes in place, resulting in decompression of the previously noted bilateral hydroureteronephrosis. The ureters are no longer abnormally dilated. 2. Mild diffuse thickening of the wall of the urinary bladder, considerably improved relative to the prior exam. 3. Circumscribed fluid collection abutting the dome of the urinary bladder, measuring approximately 3.84 x 2.5 cm. 4. Mild bilateral pleural effusions and mild dependent atelectasis. 5. Enlarged prostate gland. 6. Extensive calcific coronary artery disease. 7. Subcutaneous soft tissue stranding laterally involving the abdomen and pelvis bilaterally. Electronically signed by: Evalene Coho MD 11/26/2024 07:17 AM EST RP Workstation: [id number]   DG Chest Port 1 View Result Date: 11/26/2024 EXAM: 1 VIEW XRAY OF THE CHEST 11/26/2024 06:28:43 AM COMPARISON: 11/12/2024 CLINICAL HISTORY: Shortness of breath. FINDINGS: LINES, TUBES AND DEVICES: Left internal jugular central venous catheter removed. LUNGS AND PLEURA: There is mild hazy opacification of the lung bases bilaterally, worse on the left. The left hemidiaphragm is partially obscured. There are low lung volumes. No pleural effusion. No pneumothorax. HEART AND MEDIASTINUM: The cardiac and mediastinal silhouettes are unremarkable. There is calcification within the aortic arch. A coronary  artery stent is again demonstrated. BONES AND SOFT TISSUES: No acute osseous abnormality. IMPRESSION: 1. Interval removal of the left internal jugular central venous catheter. 2. Mild hazy opacification of the lung bases bilaterally, worse on the left, with partially obscured left hemidiaphragm and low lung volumes, favored to reflect atelectasis. Electronically signed by: Evalene Coho MD 11/26/2024 07:11 AM EST RP Workstation: [id number]    Medications: Infusions:   Scheduled Medications:  Chlorhexidine  Gluconate Cloth  6 each Topical Daily   furosemide   40 mg Intravenous Once   Gerhardt's butt cream  1 Application Topical TID   insulin  aspart  0-9 Units Subcutaneous TID WC   midodrine   15 mg Oral TID WC   mirtazapine   15 mg Oral QHS   multivitamin  1 tablet Oral QHS   pantoprazole   40 mg Oral BID   saccharomyces boulardii  250 mg Oral BID   simethicone   80 mg Oral QID   sodium bicarbonate   1,300 mg Oral BID   sodium chloride  flush  5 mL Intracatheter Q12H   sodium zirconium cyclosilicate   10 g Oral BID    have reviewed scheduled and prn medications.  Physical Exam: General:NAD, comfortable Heart:RRR, s1s2 nl Lungs:clear b/l, no crackle Abdomen:soft, Non-tender, non-distended GU: B/l nephrostomy bag with clear urine.  Extremities:No edema:   Neurology: Alert, awake, oriented  Timothea Bodenheimer Amelie Romney 11/27/2024,1:09 PM  LOS: 16 days

## 2024-11-27 NOTE — Progress Notes (Signed)
 Orthopedic Tech Progress Note Patient Details:  [name] [birth year] [id number]  Ortho Devices Type of Ortho Device: Radio broadcast assistant Ortho Device/Splint Location: BLE Ortho Device/Splint Interventions: Ordered, Application   Post Interventions Patient Tolerated: Well Instructions Provided: Care of device  [name] 11/27/2024, 9:24 AM

## 2024-11-27 NOTE — Progress Notes (Signed)
 " Daily Progress Note   Date: 11/27/2024   Patient Name: [name]  DOB: 1941-08-18  MRN: [id number]  Age / Sex: 84 y.o., male  Attending Physician: Dennise Lavada POUR, MD Primary Care Physician: Faythe Purchase, MD Admit Date: 11/11/2024 Length of Stay: 16 days  Reason for Follow-up: Establishing goals of care  Past Medical History:  Diagnosis Date   CHF (congestive heart failure) (HCC)    past echo in 2010 showed an EF of 50 to 55% with mild AS and mild mitral insufficiency   Coronary artery disease 2008   Prior anterior MI (with syncope as the initial presentation) with single vessel LAD occlusive artherosclerotic CAD -- post stent of the mid LAD   CVA (cerebral vascular accident) (HCC) 2008   at the time of his MI   Diabetes mellitus    Edema    Heart murmur    Hyperlipidemia    Hypertension    Long term current use of anticoagulant    previously on coumadin therapy.    Mitral insufficiency    Myocardial infarction, old 2008   Obesity     Assessment & Plan:   HPI/Patient Profile:   Patient is a 84 y.o. male with history of CAD s/p PCI 2008, HFmrEF, HTN, HLD-presented with generalized weakness/fatigue-loss of appetite-found to be hypotensive with AKI-thought to have obstructive uropathy. Initially admitted to the ICU for pressors-stabilized and subsequently transferred to [facility].   Palliative medicine consulted for goals of care conversation.   Received message about patient wanting to complete MOST form. After thorough discussion of options patient would like more time to consider.   SUMMARY OF RECOMMENDATIONS  Full code, full scope Continue current measures, goal of going to short term rehab Patient will notify team when ready to complete MOST form  Symptom Management:  Per primary team  Code Status: Full Code  Prognosis: Unable to determine  Discharge Planning: To Be Determined  Subjective:   Subjective: Chart Reviewed. Updates received. Patient Assessed.  Created space and opportunity for patient  and family to explore thoughts and feelings regarding current medical situation.  Today's Discussion:  Met with the patient and family about completing MOST form. Reviewed CPR/DNR and medical interventions sections. Patient initially would like an attempt at CPR and limited additional interventions. Reviewed that most times when a patient's heart stops and stops breathing, most time it will require both CPR and intubation. The success of CPR is greatly reduced when mechanical ventilation is not attempted alongside CPR. Reviewed the patient's medical conditions including his heart failure, chronic kidney disease, and CAD makes the chances of successful CPR very unlikely as well and that the medical recommendation for him is to be a DNR while pursuing limited additional interventions.   Reviewed antibiotics, IV fluids, and feeding tube sections with patient. Made the recommendation of determining use or limitation of antibiotics when infection occurs and a defined trial period for IVF and feeding tube. Reviewed scenario if patient had a stroke that resulted in loss of neurological function and swallowing function, it may not be recommended to pursue antibiotics, IVF, and a feeding tube if the chances of meaningful neurological recovery were poor. Also reviewed a scenario where if the patient had oral cancer that prevented the patient from eating adequately, then at that time it might make sense to pursue even a permanent feeding tube as long as the patient is still able to achieve an acceptable quality of life. Reviewed that in all these options, the  patient should always consider if these aggressive interventions will allow the patient to achieve an acceptable quality of life. Also reviewed that most times continued aggressive medical interventions will usually keep patients in the hospital and whether it would align with the goals of the patient and family. Patient  shared that he would not want to pursue aggressive interventions if it would only prolong suffering or does not allow him to achieve an acceptable quality of life. Patient expressed that he knows that he may not be able to go home to live with his son anymore even after short term rehab. He is already considering that he might have to go live at a long term care facility.   Patient would like more time to consider his options. Children are very supportive of whatever decision he will make. He wants to make sure his children know his wishes so that they can honor it.   Review of Systems  Respiratory:  Positive for shortness of breath.   All other systems reviewed and are negative.   Objective:   Primary Diagnoses: Present on Admission:  Renal failure  Kidney failure, acute   Vital Signs:  BP 127/77   Pulse 89   Temp 98.7 F (37.1 C) (Oral)   Resp 20   Ht 5' 5 (1.651 m)   Wt 90.3 kg   SpO2 93%   BMI 33.13 kg/m   Physical Exam HENT:     Head: Normocephalic and atraumatic.     Nose: Nose normal.     Mouth/Throat:     Mouth: Mucous membranes are dry.  Eyes:     Extraocular Movements: Extraocular movements intact.  Cardiovascular:     Rate and Rhythm: Normal rate.     Pulses: Normal pulses.  Pulmonary:     Comments: Tachypneic in the 20s, dyspnea with conversation.  Skin:    General: Skin is warm and dry.  Neurological:     General: No focal deficit present.     Mental Status: He is alert.    Palliative Assessment/Data: 50%   Existing Vynca/ACP Documentation: None  Thank you for allowing us  to participate in the care of [name] PMT will continue to support holistically.  I personally spent a total of 50 minutes in the care of the patient today including preparing to see the patient, performing a medically appropriate exam/evaluation, counseling and educating, and documenting clinical information in the EHR.  Fairy FORBES Shan DEVONNA  Palliative Medicine  Team  Team Phone # [phone] (Nights/Weekends) 11/27/2024 4:03 PM  "

## 2024-11-27 NOTE — TOC Progression Note (Signed)
 Transition of Care [facility]) - Progression Note    Patient Details  Name: [name] MRN: [id number] Date of Birth: 1941/02/20  [facility]) CM/SW Contact  Sharyne Drum, Student-Social Work Phone Number: 11/27/2024, 11:23 AM  Clinical Narrative:     MSW Student spoke with pt, son, and dtr at bedside about moving forward with [facility] for SNF. [facility] still did not have available beds. CSW will start ins auth for [facility] when medically ready to dc. Pt son and dtr interested in [facility] for transport to facility.   Expected Discharge Plan: Skilled Nursing Facility Barriers to Discharge: Continued Medical Work up, English As A Second Language Teacher               Expected Discharge Plan and Services In-house Referral: Clinical Social Work     Living arrangements for the past 2 months: Single Family Home                                       Social Drivers of Health (SDOH) Interventions SDOH Screenings   Food Insecurity: No Food Insecurity (11/11/2024)  Housing: Low Risk (11/11/2024)  Transportation Needs: No Transportation Needs (11/11/2024)  Utilities: Not At Risk (11/11/2024)  Social Connections: Moderately Integrated (11/11/2024)  Tobacco Use: Medium Risk (11/11/2024)    Readmission Risk Interventions     No data to display

## 2024-11-27 NOTE — Progress Notes (Signed)
 PT Cancellation Note  Patient Details Name: [name] MRN: [id number] DOB: Jan 26, 1941   Cancelled Treatment:    Reason Eval/Treat Not Completed: (P) Other (comment) Care provider sitting beside bed speaking with patient and family for extended time. PT will follow back for treatment another day.   [place] Fle[place] Lapidus PT, DPT Acute Rehabilitation Services Please use secure chat or  Call Office [phone]    Almarie KATHEE Fleeta [facility] 11/27/2024, 3:47 PM

## 2024-11-27 NOTE — Progress Notes (Signed)
 [facility] Progress Note Patient: [name] FMW:[id number] DOB: 1941-06-13  DOA: 11/11/2024 DOS: the patient was seen and examined on 11/27/2024  Brief Summary: Patient is a 84 y.o.  male with history of CAD s/p PCI 2008, HFmrEF, HTN, HLD-presented with generalized weakness/fatigue-loss of appetite-found to be hypotensive with AKI-thought to have obstructive uropathy.  Initially admitted to the ICU for pressors-stabilized and subsequently transferred to [facility].   Significant events: 1/14>> admit to ICU 1/16>> received 1 unit PRBC. 1/19>> transferred to [facility]. 1/21>> bilateral PCN performed. 1/22>> 1 unit PRBC given. 1/24>> 1 more PRBC given 1/25>> US  renal mild bilateral hydronephrosis mild pleural effusion.  Significant studies: 1/14>> CXR: No PNA 1/14>> CT head: No acute intracranial process 1/14>> CT abdomen/pelvis: Severe bilateral hydronephrosis 1/14>> renal ultrasound: Severe bilateral hydronephrosis. 1/15>> echo: EF 55-60% 1/17>> renal ultrasound: Severe bilateral hydronephrosis 1/19>> x-ray KUB: Dilated loops of small bowel-at least consistent with a partial SBO. Pete CT scan 11/26/2024.  Shows bilateral nephrostomy tubes, decompressed ureters, nonspecific thickening in the urinary bladder   Significant microbiology data: 1/14>> COVID/influenza/RSV PCR: Negative 1/14>> blood culture: No growth 1/15>> urine culture: No growth   Consults: PCCM Nephrology Neurology IR   Assessment and plan:  AKI Secondary to obstructive uropathy Presents with decreased urine output.  Found to have AKI with hyperkalemia upon admission.  Concern for infection.  Admitted to the ICU for vasopressor. Seen by urology and IR, underwent bilateral nephrostomy tube placement. Also seen by nephrology Repeat CT scan noted on 11/26/2024, urology following.  Defer management of urological issues to urology. Renal function proved but still creatinine quite high with hyperkalemia, requested  nephrology to reevaluate on 11/27/2024, will give him a trial of packed RBC transfusion as well. Per urology family has decided to go to SNF with bilateral nephrostomy tubes with outpatient follow-up with Dr. Watt  hyperkalemia Lokelma .   Metabolic acidosis Due to AKI on oral bicarb   Multifactorial shock Secondary to septic shock due to UTI and hypovolemic shock from poor oral intake BP has stabilized with supportive care Briefly required pressors on initial presentation Blood pressure remains soft, IV fluids on 11/25/2024 Continue midodrine  and monitor.  Symptom-free appears nontoxic, random TSH and cortisol stable, 1 unit packed RBC on 11/27/2024   Complicated UTI Treated with Rocephin -although urine cultures negative-this was done after patient was given antibiotics   Ileus resolved. Patient with significant distention of his bowels. No definitive obstruction seen. Actually has a bowel movement and passing gas. Repeat x-ray on 1/23 shows improvement in ileus.   GERD Dyspeptic symptoms are much better Continue PPI As needed simethicone .   Normocytic anemia Acute blood loss anemia Likely secondary to critical illness-has some hematuria after PCN placement. Has received total 3 units of packed RBC transfusion last on 11/21/2024 Holding aspirin  and heparin  for DVT prophylaxis.  Continue twice daily PPI, no signs of active bleeding Follow CBC, type screen done will follow closely will give him 1 unit packed RBC on 11/27/2024   History of CAD s/p PCI 2008 No anginal symptoms   Chronic HFmrEF Euvolemic Diuretics on hold   HTN Blood pressure low currently on IV fluids and midodrine     Anorexia/failure to thrive syndrome Poor oral intake for the past several months Patient not keen on getting NG tube placed Thankfully slowly improving-continue to encourage oral intake-continue supplements Remains on Remeron  Palliative care following.   Moderate Malnutrition Class I  obesity. Body mass index is 30.96 kg/m.  Etiology: acute illness Signs/Symptoms: mild fat  depletion, mild muscle depletion Interventions: Refer to RD note for recommendations Placing the patient at high risk for outcome.  Stage II coccyx ulcer, upper back pressure ulcers. Present on admission. Monitor.  Hematuria. Bilateral PCN had bloody discharge.  Now resolved. IR consulted to reevaluate the PCN.  Recommended continue irrigation. Monitor H&H.  Hypoalbuminemia with third spacing. Albumin  level is 2. Has lower extremity swelling.  Bilateral pleural effusion. Suspect this is third spacing related to hypoalbuminemia. Will eventually need to resume diuresis.  Type of diabetes mellitus, well-controlled without long-term insulin  use with CKD. Currently on sliding scale insulin .  Lab Results  Component Value Date   HGBA1C 6.4 (H) 11/12/2024    CBG (last 3)  Recent Labs    11/26/24 1619 11/26/24 2044 11/27/24 0745  GLUCAP 137* 126* 94    Goals of care conversation. 1/25 extensive discussion with patient at bedside.  Daughter and son at bedside as well. Patient feels that the way he is feeling right now he would like to go through an attempt of resuscitation understanding that there is a very high risk of his course after resuscitation may not be without any significant complications. Patient does not want to be on lifelong hemodialysis.  Patient also knows that he would likely be not a candidate for lifelong hemodialysis. Patient would like to attempt short-term ICU stay should he need 1.    DVT Prophylaxis: Place and maintain sequential compression device Start: 11/21/24 1110 SCDs Start: 11/11/24 1516   Data review I have Reviewed nursing notes, Vitals, and Lab results. Since last encounter, pertinent lab results CBC and BMP   . I have ordered test including CBC and BMP  . I have discussed pt's care plan and test results with urology  .    Physical exam. Vitals:    11/27/24 0200 11/27/24 0400 11/27/24 0500 11/27/24 0746  BP:  (!) 111/53  (!) 104/56  Pulse: 78 81  76  Resp: 17 16  14   Temp:  (!) 96.8 F (36 C)  98.5 F (36.9 C)  TempSrc:  Axillary  Axillary  SpO2: 95% 94%  96%  Weight:   90.3 kg   Height:       Awake Alert, No new F.N deficits, Normal affect [place].AT,PERRAL Supple Neck, No JVD,   Symmetrical Chest wall movement, Good air movement bilaterally, CTAB RRR,No Gallops, Rubs or new Murmurs,  +ve B.Sounds, Abd Soft, No tenderness, bilateral nephrostomy tubes in place No Cyanosis, Clubbing or edema    Subjective: Patient in bed, appears comfortable, denies any headache, no fever, no chest pain or pressure, no shortness of breath , no abdominal pain. No focal weakness.   Family Communication: daughter at bedside 11/25/24.  11/26/2024  Disposition Plan: Status is: Inpatient Remains inpatient appropriate because: Monitor for improvement in renal function.   Planned Discharge Destination: TBD Diet: Diet Order             Diet regular Room service appropriate? Yes; Fluid consistency: Thin  Diet effective now                    Data Review:   Patient Lines/Drains/Airways Status     Active Line/Drains/Airways     Name Placement date Placement time Site Days   Peripheral IV 11/20/24 22 G 1 Anterior;Left Forearm 11/20/24  1209  Forearm  7   Nephrostomy Left 10.2 Fr. 11/18/24  1006  Left  9   Nephrostomy Right 10.2 Fr. 11/18/24  1011  Right  9   Wound 11/11/24 1850 Pressure Injury Coccyx Mid Stage 2 -  Partial thickness loss of dermis presenting as a shallow open injury with a red, pink wound bed without slough. 11/11/24  1850  Coccyx  16   Wound 11/11/24 1850 Pressure Injury Back Mid;Upper Deep Tissue Pressure Injury - Purple or maroon localized area of discolored intact skin or blood-filled blister due to damage of underlying soft tissue from pressure and/or shear. 11/11/24  1850  Back  16   Wound 11/11/24 1850 Pressure  Injury Head Posterior;Medial Deep Tissue Pressure Injury - Purple or maroon localized area of discolored intact skin or blood-filled blister due to damage of underlying soft tissue from pressure and/or shear. 11/11/24  1850  Head  16             Inpatient Medications  Scheduled Meds:  sodium chloride    Intravenous Once   Chlorhexidine  Gluconate Cloth  6 each Topical Daily   furosemide   40 mg Intravenous Once   Gerhardt's butt cream  1 Application Topical TID   insulin  aspart  0-9 Units Subcutaneous TID WC   midodrine   15 mg Oral TID WC   mirtazapine   15 mg Oral QHS   multivitamin  1 tablet Oral QHS   pantoprazole   40 mg Oral BID   saccharomyces boulardii  250 mg Oral BID   simethicone   80 mg Oral QID   sodium bicarbonate   1,300 mg Oral BID   sodium chloride  flush  5 mL Intracatheter Q12H   sodium zirconium cyclosilicate   10 g Oral BID   Continuous Infusions:   PRN Meds:.acetaminophen , ondansetron  (ZOFRAN ) IV, polyethylene glycol  DVT Prophylaxis  Place and maintain sequential compression device Start: 11/21/24 1110 SCDs Start: 11/11/24 1516   Recent Labs  Lab 11/23/24 0233 11/23/24 2028 11/25/24 0339 11/26/24 0337 11/27/24 0559  WBC 7.7 8.9 6.4 5.2 4.2  HGB 7.8* 8.2* 7.6* 7.4* 7.0*  HCT 24.4* 24.8* 23.8* 22.9* 21.7*  PLT 176 210 220 226 230  MCV 98.4 97.3 98.3 98.3 98.6  MCH 31.5 32.2 31.4 31.8 31.8  MCHC 32.0 33.1 31.9 32.3 32.3  RDW 18.5* 18.0* 17.5* 17.2* 17.0*  LYMPHSABS  --   --   --  0.6* 0.6*  MONOABS  --   --   --  0.7 0.5  EOSABS  --   --   --  0.3 0.4  BASOSABS  --   --   --  0.0 0.0    Recent Labs  Lab 11/22/24 1420 11/22/24 1821 11/23/24 0233 11/24/24 0347 11/25/24 0339 11/26/24 0337 11/27/24 0559  NA  --    < > 135 136 136 136 134*  K  --    < > 4.8 4.5 5.2* 5.4* 5.6*  CL  --    < > 106 106 104 106 103  CO2  --    < > 19* 21* 22 22 19*  ANIONGAP  --    < > 10 9 9 9 12   GLUCOSE  --    < > 122* 101* 121* 113* 93  BUN  --    < > 83*  85* 91* 92* 95*  CREATININE  --    < > 5.20* 5.01* 4.72* 4.70* 4.55*  AST 30  --   --   --   --   --   --   ALT 31  --   --   --   --   --   --  ALKPHOS 71  --   --   --   --   --   --   BILITOT 0.3  --   --   --   --   --   --   ALBUMIN  2.1*   < > 2.6* 2.6* 2.4* 2.6* 2.2*  LATICACIDVEN 2.2*  --   --   --   --   --   --   TSH 4.520*  --   --   --   --   --   --   MG  --   --  1.8 1.7 1.6* 2.0 1.9  PHOS  --    < > 2.3* 2.5 2.3* 2.8 3.5  CALCIUM   --    < > 7.3* 7.6* 7.4* 7.8* 7.6*   < > = values in this interval not displayed.      Recent Labs  Lab 11/22/24 1420 11/22/24 1821 11/23/24 0233 11/24/24 0347 11/25/24 0339 11/26/24 0337 11/27/24 0559  LATICACIDVEN 2.2*  --   --   --   --   --   --   TSH 4.520*  --   --   --   --   --   --   MG  --   --  1.8 1.7 1.6* 2.0 1.9  CALCIUM   --    < > 7.3* 7.6* 7.4* 7.8* 7.6*   < > = values in this interval not displayed.    --------------------------------------------------------------------------------------------------------------- Lab Results  Component Value Date   CHOL 101 03/07/2017   HDL 46 03/07/2017   LDLCALC 43 03/07/2017   TRIG 59 03/07/2017   CHOLHDL 2.2 03/07/2017    Lab Results  Component Value Date   HGBA1C 6.4 (H) 11/12/2024   No results for input(s): TSH, T4TOTAL, FREET4, T3FREE, THYROIDAB in the last 72 hours.  No results for input(s): VITAMINB12, FOLATE, FERRITIN, TIBC, IRON, RETICCTPCT in the last 72 hours. ------------------------------------------------------------------------------------------------------------------ Cardiac Enzymes No results for input(s): CKMB, TROPONINI, MYOGLOBIN in the last 168 hours.  Invalid input(s): CK  Micro Results No results found for this or any previous visit (from the past 240 hours).  Radiology Reports  CT ABDOMEN PELVIS WO CONTRAST Result Date: 11/26/2024 EXAM: CT ABDOMEN AND PELVIS WITHOUT CONTRAST 11/25/2024 06:44:50 PM TECHNIQUE:  CT of the abdomen and pelvis was performed without the administration of intravenous contrast. Multiplanar reformatted images are provided for review. Automated exposure control, iterative reconstruction, and/or weight-based adjustment of the mA/kV was utilized to reduce the radiation dose to as low as reasonably achievable. COMPARISON: CT of the abdomen and pelvis dated 11/11/2024. CLINICAL HISTORY: bladder thickening, obstruction. Bladder thickening, obstruction. FINDINGS: LOWER CHEST: Mild bilateral dependent atelectasis and mild bilateral pleural effusions. Extensive calcific coronary artery disease again demonstrated. LIVER: The liver is unremarkable. GALLBLADDER AND BILE DUCTS: The gallbladder is contracted. No biliary ductal dilatation. SPLEEN: No acute abnormality. PANCREAS: No acute abnormality. ADRENAL GLANDS: No acute abnormality. KIDNEYS, URETERS AND BLADDER: Bilateral nephrostomy tubes have been placed, resulting in decompression of the previously noted bilateral hydroureteronephrosis. The ureters are no longer abnormally dilated. There is mild diffuse thickening of the wall of the urinary bladder, but it is considerably improved relative to the prior exam. There is a circumscribed fluid collection again seen abutting the dome of the urinary bladder, measuring approximately 3.84 x 2.5 cm. A foley catheter has been removed in the interim. No stones in the kidneys or ureters. No perinephric or periureteral stranding. GI AND BOWEL: Stomach demonstrates no acute  abnormality. There are few sigmoid diverticula. There is no evidence of diverticulitis. There is no bowel obstruction. PERITONEUM AND RETROPERITONEUM: No ascites. No free air. VASCULATURE: Aorta is normal in caliber. LYMPH NODES: No lymphadenopathy. REPRODUCTIVE ORGANS: The prostate gland is enlarged. BONES AND SOFT TISSUES: No acute osseous abnormality. There is subcutaneous soft tissue stranding laterally involving the abdomen and pelvis  bilaterally. IMPRESSION: 1. Bilateral nephrostomy tubes in place, resulting in decompression of the previously noted bilateral hydroureteronephrosis. The ureters are no longer abnormally dilated. 2. Mild diffuse thickening of the wall of the urinary bladder, considerably improved relative to the prior exam. 3. Circumscribed fluid collection abutting the dome of the urinary bladder, measuring approximately 3.84 x 2.5 cm. 4. Mild bilateral pleural effusions and mild dependent atelectasis. 5. Enlarged prostate gland. 6. Extensive calcific coronary artery disease. 7. Subcutaneous soft tissue stranding laterally involving the abdomen and pelvis bilaterally. Electronically signed by: Evalene Coho MD 11/26/2024 07:17 AM EST RP Workstation: [id number]   DG Chest Port 1 View Result Date: 11/26/2024 EXAM: 1 VIEW XRAY OF THE CHEST 11/26/2024 06:28:43 AM COMPARISON: 11/12/2024 CLINICAL HISTORY: Shortness of breath. FINDINGS: LINES, TUBES AND DEVICES: Left internal jugular central venous catheter removed. LUNGS AND PLEURA: There is mild hazy opacification of the lung bases bilaterally, worse on the left. The left hemidiaphragm is partially obscured. There are low lung volumes. No pleural effusion. No pneumothorax. HEART AND MEDIASTINUM: The cardiac and mediastinal silhouettes are unremarkable. There is calcification within the aortic arch. A coronary artery stent is again demonstrated. BONES AND SOFT TISSUES: No acute osseous abnormality. IMPRESSION: 1. Interval removal of the left internal jugular central venous catheter. 2. Mild hazy opacification of the lung bases bilaterally, worse on the left, with partially obscured left hemidiaphragm and low lung volumes, favored to reflect atelectasis. Electronically signed by: Evalene Coho MD 11/26/2024 07:11 AM EST RP Workstation: [id number]      Signature  -   Lavada Stank M.D on 11/27/2024 at 9:56 AM   -  To page go to [email or web address]

## 2024-11-28 DIAGNOSIS — Z7189 Other specified counseling: Secondary | ICD-10-CM | POA: Diagnosis not present

## 2024-11-28 DIAGNOSIS — Z515 Encounter for palliative care: Secondary | ICD-10-CM | POA: Diagnosis not present

## 2024-11-28 LAB — BASIC METABOLIC PANEL WITH GFR
Anion gap: 12 (ref 5–15)
BUN: 95 mg/dL — ABNORMAL HIGH (ref 8–23)
CO2: 21 mmol/L — ABNORMAL LOW (ref 22–32)
Calcium: 7.9 mg/dL — ABNORMAL LOW (ref 8.9–10.3)
Chloride: 101 mmol/L (ref 98–111)
Creatinine, Ser: 4.44 mg/dL — ABNORMAL HIGH (ref 0.61–1.24)
GFR, Estimated: 13 mL/min — ABNORMAL LOW
Glucose, Bld: 89 mg/dL (ref 70–99)
Potassium: 5.4 mmol/L — ABNORMAL HIGH (ref 3.5–5.1)
Sodium: 135 mmol/L (ref 135–145)

## 2024-11-28 LAB — CBC WITH DIFFERENTIAL/PLATELET
Abs Immature Granulocytes: 0.05 10*3/uL (ref 0.00–0.07)
Basophils Absolute: 0 10*3/uL (ref 0.0–0.1)
Basophils Relative: 1 %
Eosinophils Absolute: 0.5 10*3/uL (ref 0.0–0.5)
Eosinophils Relative: 10 %
HCT: 25.1 % — ABNORMAL LOW (ref 39.0–52.0)
Hemoglobin: 8.4 g/dL — ABNORMAL LOW (ref 13.0–17.0)
Immature Granulocytes: 1 %
Lymphocytes Relative: 14 %
Lymphs Abs: 0.6 10*3/uL — ABNORMAL LOW (ref 0.7–4.0)
MCH: 31.9 pg (ref 26.0–34.0)
MCHC: 33.5 g/dL (ref 30.0–36.0)
MCV: 95.4 fL (ref 80.0–100.0)
Monocytes Absolute: 0.6 10*3/uL (ref 0.1–1.0)
Monocytes Relative: 13 %
Neutro Abs: 2.8 10*3/uL (ref 1.7–7.7)
Neutrophils Relative %: 61 %
Platelets: 225 10*3/uL (ref 150–400)
RBC: 2.63 MIL/uL — ABNORMAL LOW (ref 4.22–5.81)
RDW: 17.7 % — ABNORMAL HIGH (ref 11.5–15.5)
WBC: 4.5 10*3/uL (ref 4.0–10.5)
nRBC: 0 % (ref 0.0–0.2)

## 2024-11-28 LAB — BPAM RBC
Blood Product Expiration Date: 202602112359
ISSUE DATE / TIME: 202601301052
Unit Type and Rh: 600

## 2024-11-28 LAB — TYPE AND SCREEN
ABO/RH(D): A NEG
Antibody Screen: NEGATIVE
Unit division: 0

## 2024-11-28 LAB — MAGNESIUM: Magnesium: 1.8 mg/dL (ref 1.7–2.4)

## 2024-11-28 LAB — GLUCOSE, CAPILLARY
Glucose-Capillary: 90 mg/dL (ref 70–99)
Glucose-Capillary: 136 mg/dL — ABNORMAL HIGH (ref 70–99)
Glucose-Capillary: 130 mg/dL — ABNORMAL HIGH (ref 70–99)
Glucose-Capillary: 127 mg/dL — ABNORMAL HIGH (ref 70–99)

## 2024-11-28 MED ORDER — FUROSEMIDE 10 MG/ML IJ SOLN
60.0000 mg | Freq: Once | INTRAMUSCULAR | Status: AC
  Administered 2024-11-28: 60 mg via INTRAVENOUS
  Filled 2024-11-28: qty 6

## 2024-11-28 MED ORDER — SODIUM ZIRCONIUM CYCLOSILICATE 10 G PO PACK
10.0000 g | PACK | Freq: Two times a day (BID) | ORAL | Status: DC
  Administered 2024-11-28 – 2024-11-30 (×5): 10 g via ORAL
  Filled 2024-11-28 (×5): qty 1

## 2024-11-28 NOTE — Progress Notes (Signed)
 Physical Therapy Treatment Patient Details Name: [name] MRN: [id number] DOB: 1941-04-22 Today's Date: 11/28/2024   History of Present Illness 84 y.o. male admitted 11/11/24 with weakness, fatigue, decreased appetite, decreased urine output. Workup for severe AKI, septic shock secondary to UTI, severe bilateral hydronephrosis. Transfer out of ICU 1/18. Imaging 1/19 consistent with partial SBO. PMH includes HTN, HLD, DM, NSTEMI, ischemic cardiomyopathy, HF, CVA, heart murmur.    PT Comments  Pt reporting increased B LE edema and heaviness with mobility. Pt demonstrates increased difficulty with initial STS, requiring modA to complete stance. Pt improved to CGA/minA with step by step cues and repositioning of feet prior to initiating stance. STS x5 completed throughout duration of session and pt demonstrates improved technique with each attempt. Pt tolerating two short bouts of ambulation with seated rest break taken between bouts. Pt requires cues for walker management due to standing too far forward and increasing risk of tipping. Pt would benefit from continued PT services focused on transfers, balance, and progressing gait to improve safety and tolerance to functional mobility.     If plan is discharge home, recommend the following: A little help with walking and/or transfers;A lot of help with bathing/dressing/bathroom;Assistance with cooking/housework;Direct supervision/assist for medications management;Direct supervision/assist for financial management;Assist for transportation;Help with stairs or ramp for entrance   Can travel by private vehicle     No  Equipment Recommendations  Rolling walker (2 wheels)    Recommendations for Other Services       Precautions / Restrictions Precautions Precautions: Fall Recall of Precautions/Restrictions: Intact Precaution/Restrictions Comments: watch BP; B nephrostomy tubes Restrictions Weight Bearing Restrictions Per Provider Order: No      Mobility  Bed Mobility Overal bed mobility: Needs Assistance Bed Mobility: Supine to Sit     Supine to sit: Min assist     General bed mobility comments: Difficulty sequencing UE placement for bed mobility, improves with cues. Light assist to reposition hips EOB but pt able to reposition self with increased time.    Transfers Overall transfer level: Needs assistance Equipment used: Rolling walker (2 wheels) Transfers: Sit to/from Stand Sit to Stand: Mod assist, Min assist           General transfer comment: Initial STS modA due to feet placement being too far anterior. Improves to minA for second attempt and improved positioning. Improves to CGA after education with step by step cues to improve sequencing and initiation.    Ambulation/Gait Ambulation/Gait assistance: Contact guard assist, +2 safety/equipment (Daughter assisting with chair follow) Gait Distance (Feet): 20 Feet (+35') Assistive device: Rolling walker (2 wheels) Gait Pattern/deviations: Step-to pattern, Decreased stride length, Trunk flexed, Decreased stance time - right   Gait velocity interpretation: <1.31 ft/sec, indicative of household ambulator   General Gait Details: Pt reports B LE feels heavy. Two seated rest breaks taken. Provides self cues to avoid looking down when ambulating. Cues needed for walker management due to  standing too far forward in walker.   Stairs             Wheelchair Mobility     Tilt Bed    Modified Rankin (Stroke Patients Only)       Balance Overall balance assessment: Needs assistance Sitting-balance support: No upper extremity supported, Feet supported Sitting balance-Leahy Scale: Good Sitting balance - Comments: No sitting balance concerns.   Standing balance support: Bilateral upper extremity supported, Reliant on assistive device for balance, During functional activity Standing balance-Leahy Scale: Poor Standing balance comment: UE reliance  on RW                             Communication Communication Communication: Impaired Factors Affecting Communication: Hearing impaired  Cognition Arousal: Alert Behavior During Therapy: WFL for tasks assessed/performed   PT - Cognitive impairments: Problem solving                       PT - Cognition Comments: Repetition of cues occasionally neeed, improving providing self cues during mobility, showing carryover of education provided. Following commands: Intact Following commands impaired: Only follows one step commands consistently, Follows multi-step commands with increased time    Cueing Cueing Techniques: Verbal cues, Visual cues  Exercises Other Exercises Other Exercises: STS x3    General Comments General comments (skin integrity, edema, etc.): VSS throughout. Drains intact. No new skin abnormalities noted.      Pertinent Vitals/Pain Pain Assessment Pain Assessment: No/denies pain    Home Living                          Prior Function            PT Goals (current goals can now be found in the care plan section) Acute Rehab PT Goals Patient Stated Goal: return home; children interested in SNF rehab PT Goal Formulation: With patient Time For Goal Achievement: 11/30/24 Progress towards PT goals: Progressing toward goals    Frequency    Min 3X/week      PT Plan      Co-evaluation              AM-PAC PT 6 Clicks Mobility   Outcome Measure  Help needed turning from your back to your side while in a flat bed without using bedrails?: A Little Help needed moving from lying on your back to sitting on the side of a flat bed without using bedrails?: A Little Help needed moving to and from a bed to a chair (including a wheelchair)?: A Little Help needed standing up from a chair using your arms (e.g., wheelchair or bedside chair)?: A Little Help needed to walk in hospital room?: A Little Help needed climbing 3-5 steps with a railing?  : Total 6 Click Score: 16    End of Session Equipment Utilized During Treatment: Gait belt Activity Tolerance: Patient tolerated treatment well Patient left: in chair;with call bell/phone within reach;with family/visitor present Nurse Communication: Mobility status PT Visit Diagnosis: Unsteadiness on feet (R26.81);Muscle weakness (generalized) (M62.81)     Time: 8862-8784 PT Time Calculation (min) (ACUTE ONLY): 38 min  Charges:    $Gait Training: 23-37 mins $Therapeutic Activity: 8-22 mins PT General Charges $$ ACUTE PT VISIT: 1 Visit                     [name], PT, DPT  Acute Rehabilitation Services         Office: [phone]      [name] 11/28/2024, 12:59 PM

## 2024-11-28 NOTE — Plan of Care (Signed)

## 2024-11-28 NOTE — TOC Progression Note (Addendum)
 Transition of Care [facility]) - Progression Note    Patient Details  Name: CHEZ BULNES MRN: [id number] Date of Birth: 03/13/1941  Transition of Care [facility]) CM/SW Contact  Luise JAYSON Pan, [place] Phone Number: 11/28/2024, 8:44 AM  Clinical Narrative:   Per MD, patient likely to be medically stable for dc tomorrow. CSW informed Darrien at [facility] and rehab.   10:42 AM Per Darrien, due to inclement weather patient will need to wait until Mon to dc if medically stable.  CSW will continue to follow.    Expected Discharge Plan: Skilled Nursing Facility Barriers to Discharge: Continued Medical Work up, English As A Second Language Teacher               Expected Discharge Plan and Services In-house Referral: Clinical Social Work     Living arrangements for the past 2 months: Single Family Home                                       Social Drivers of Health (SDOH) Interventions SDOH Screenings   Food Insecurity: No Food Insecurity (11/11/2024)  Housing: Low Risk (11/11/2024)  Transportation Needs: No Transportation Needs (11/11/2024)  Utilities: Not At Risk (11/11/2024)  Social Connections: Moderately Integrated (11/11/2024)  Tobacco Use: Medium Risk (11/11/2024)    Readmission Risk Interventions     No data to display

## 2024-11-28 NOTE — Progress Notes (Signed)
 [facility] Progress Note Patient: [name] FMW:[id number] DOB: 03/17/1941  DOA: 11/11/2024 DOS: the patient was seen and examined on 11/28/2024  Brief Summary: Patient is a 84 y.o.  male with history of CAD s/p PCI 2008, HFmrEF, HTN, HLD-presented with generalized weakness/fatigue-loss of appetite-found to be hypotensive with AKI-thought to have obstructive uropathy.  Initially admitted to the ICU for pressors-stabilized and subsequently transferred to [facility].   Significant events: 1/14>> admit to ICU 1/16>> received 1 unit PRBC. 1/19>> transferred to [facility]. 1/21>> bilateral PCN performed. 1/22>> 1 unit PRBC given. 1/24>> 1 more PRBC given 1/25>> US  renal mild bilateral hydronephrosis mild pleural effusion.  Significant studies: 1/14>> CXR: No PNA 1/14>> CT head: No acute intracranial process 1/14>> CT abdomen/pelvis: Severe bilateral hydronephrosis 1/14>> renal ultrasound: Severe bilateral hydronephrosis. 1/15>> echo: EF 55-60% 1/17>> renal ultrasound: Severe bilateral hydronephrosis 1/19>> x-ray KUB: Dilated loops of small bowel-at least consistent with a partial SBO. Pete CT scan 11/26/2024.  Shows bilateral nephrostomy tubes, decompressed ureters, nonspecific thickening in the urinary bladder   Significant microbiology data: 1/14>> COVID/influenza/RSV PCR: Negative 1/14>> blood culture: No growth 1/15>> urine culture: No growth   Consults: PCCM Nephrology Neurology IR   Assessment and plan:  AKI Secondary to obstructive uropathy Presents with decreased urine output.  Found to have AKI with hyperkalemia upon admission.  Concern for infection.  Admitted to the ICU for vasopressor. Seen by urology and IR, underwent bilateral nephrostomy tube placement. Also seen by nephrology Repeat CT scan noted on 11/26/2024, urology following.  Defer management of urological issues to urology. Renal function proved but still creatinine quite high with hyperkalemia, requested  nephrology to reevaluate on 11/27/2024, will give him a trial of packed RBC transfusion as well.  Family leaning more towards non-HD treatment, still not finally decided per daughter on 11/28/2024 Per urology family has decided to go to SNF with bilateral nephrostomy tubes with outpatient follow-up with Dr. Watt  hyperkalemia Lokelma .   Metabolic acidosis Due to AKI on oral bicarb   Multifactorial shock Secondary to septic shock due to UTI and hypovolemic shock from poor oral intake BP has stabilized with supportive care Briefly required pressors on initial presentation Blood pressure remains soft, IV fluids on 11/25/2024 Continue midodrine  and monitor.  Symptom-free appears nontoxic, random TSH and cortisol stable, 1 unit packed RBC on 11/27/2024   Complicated UTI Treated with Rocephin -although urine cultures negative-this was done after patient was given antibiotics   Ileus resolved. Patient with significant distention of his bowels. No definitive obstruction seen. Actually has a bowel movement and passing gas. Repeat x-ray on 1/23 shows improvement in ileus.   GERD Dyspeptic symptoms are much better Continue PPI As needed simethicone .   Normocytic anemia Acute blood loss anemia Likely secondary to critical illness-has some hematuria after PCN placement. Has received total 3 units of packed RBC transfusion last on 11/21/2024 Holding aspirin  and heparin  for DVT prophylaxis.  Continue twice daily PPI, no signs of active bleeding Follow CBC, type screen done will follow closely will give him 1 unit packed RBC on 11/27/2024   History of CAD s/p PCI 2008 No anginal symptoms   Chronic HFmrEF Euvolemic Diuretics on hold   HTN Blood pressure low currently on IV fluids and midodrine     Anorexia/failure to thrive syndrome Poor oral intake for the past several months Patient not keen on getting NG tube placed Thankfully slowly improving-continue to encourage oral intake-continue  supplements Remains on Remeron  Palliative care following.   Moderate Malnutrition Class  I obesity. Body mass index is 30.96 kg/m.  Etiology: acute illness Signs/Symptoms: mild fat depletion, mild muscle depletion Interventions: Refer to RD note for recommendations Placing the patient at high risk for outcome.  Stage II coccyx ulcer, upper back pressure ulcers. Present on admission. Monitor.  Hematuria. Bilateral PCN had bloody discharge.  Now resolved. IR consulted to reevaluate the PCN.  Recommended continue irrigation. Monitor H&H.  Hypoalbuminemia with third spacing. Albumin  level is 2. Has lower extremity swelling.  Bilateral pleural effusion. Suspect this is third spacing related to hypoalbuminemia. Will eventually need to resume diuresis.  Type of diabetes mellitus, well-controlled without long-term insulin  use with CKD. Currently on sliding scale insulin .  Lab Results  Component Value Date   HGBA1C 6.4 (H) 11/12/2024    CBG (last 3)  Recent Labs    11/27/24 1611 11/27/24 2124 11/28/24 0829  GLUCAP 104* 107* 90    Goals of care conversation. 1/25 extensive discussion with patient at bedside.  Daughter and son at bedside as well. Patient feels that the way he is feeling right now he would like to go through an attempt of resuscitation understanding that there is a very high risk of his course after resuscitation may not be without any significant complications. Patient does not want to be on lifelong hemodialysis.  Patient also knows that he would likely be not a candidate for lifelong hemodialysis. Patient would like to attempt short-term ICU stay should he need 1.    DVT Prophylaxis: Place and maintain sequential compression device Start: 11/21/24 1110 SCDs Start: 11/11/24 1516   Data review I have Reviewed nursing notes, Vitals, and Lab results. Since last encounter, pertinent lab results CBC and BMP   . I have ordered test including CBC and BMP   . I have discussed pt's care plan and test results with urology  .    Physical exam. Vitals:   11/28/24 0451 11/28/24 0630 11/28/24 0641 11/28/24 0800  BP:    (!) 111/51  Pulse:    74  Resp:    15  Temp:    99.1 F (37.3 C)  TempSrc:    Oral  SpO2:  (!) 89% (!) 87% 95%  Weight: 92 kg     Height:       Awake Alert, No new F.N deficits, Normal affect [place].AT,PERRAL Supple Neck, No JVD,   Symmetrical Chest wall movement, Good air movement bilaterally, CTAB RRR,No Gallops, Rubs or new Murmurs,  +ve B.Sounds, Abd Soft, No tenderness, bilateral nephrostomy tubes in place No Cyanosis, Clubbing or edema    Subjective: Patient in bed, appears comfortable, denies any headache, no fever, no chest pain or pressure, no shortness of breath , no abdominal pain. No focal weakness.   Family Communication: daughter at bedside 11/25/24.  11/26/2024  Disposition Plan: Status is: Inpatient Remains inpatient appropriate because: Monitor for improvement in renal function.   Planned Discharge Destination: TBD Diet: Diet Order             Diet regular Room service appropriate? Yes; Fluid consistency: Thin  Diet effective now                    Data Review:   Patient Lines/Drains/Airways Status     Active Line/Drains/Airways     Name Placement date Placement time Site Days   Peripheral IV 11/20/24 22 G 1 Anterior;Left Forearm 11/20/24  1209  Forearm  8   Peripheral IV 11/27/24 22 G 1.75 Anterior;Right Forearm 11/27/24  1423  Forearm  1   Nephrostomy Left 10.2 Fr. 11/18/24  1006  Left  10   Nephrostomy Right 10.2 Fr. 11/18/24  1011  Right  10   Wound 11/11/24 1850 Pressure Injury Coccyx Mid Stage 2 -  Partial thickness loss of dermis presenting as a shallow open injury with a red, pink wound bed without slough. 11/11/24  1850  Coccyx  17   Wound 11/11/24 1850 Pressure Injury Back Mid;Upper Deep Tissue Pressure Injury - Purple or maroon localized area of discolored intact skin or  blood-filled blister due to damage of underlying soft tissue from pressure and/or shear. 11/11/24  1850  Back  17   Wound 11/11/24 1850 Pressure Injury Head Posterior;Medial Deep Tissue Pressure Injury - Purple or maroon localized area of discolored intact skin or blood-filled blister due to damage of underlying soft tissue from pressure and/or shear. 11/11/24  1850  Head  17             Inpatient Medications  Scheduled Meds:  Chlorhexidine  Gluconate Cloth  6 each Topical Daily   Gerhardt's butt cream  1 Application Topical TID   insulin  aspart  0-9 Units Subcutaneous TID WC   midodrine   15 mg Oral TID WC   mirtazapine   15 mg Oral QHS   multivitamin  1 tablet Oral QHS   pantoprazole   40 mg Oral BID   saccharomyces boulardii  250 mg Oral BID   simethicone   80 mg Oral QID   sodium bicarbonate   1,300 mg Oral BID   sodium chloride  flush  5 mL Intracatheter Q12H   sodium zirconium cyclosilicate   10 g Oral BID   Continuous Infusions:   PRN Meds:.acetaminophen , ondansetron  (ZOFRAN ) IV, polyethylene glycol  DVT Prophylaxis  Place and maintain sequential compression device Start: 11/21/24 1110 SCDs Start: 11/11/24 1516   Recent Labs  Lab 11/23/24 2028 11/25/24 0339 11/26/24 0337 11/27/24 0559 11/28/24 0516  WBC 8.9 6.4 5.2 4.2 4.5  HGB 8.2* 7.6* 7.4* 7.0* 8.4*  HCT 24.8* 23.8* 22.9* 21.7* 25.1*  PLT 210 220 226 230 225  MCV 97.3 98.3 98.3 98.6 95.4  MCH 32.2 31.4 31.8 31.8 31.9  MCHC 33.1 31.9 32.3 32.3 33.5  RDW 18.0* 17.5* 17.2* 17.0* 17.7*  LYMPHSABS  --   --  0.6* 0.6* 0.6*  MONOABS  --   --  0.7 0.5 0.6  EOSABS  --   --  0.3 0.4 0.5  BASOSABS  --   --  0.0 0.0 0.0    Recent Labs  Lab 11/22/24 1420 11/22/24 1821 11/23/24 0233 11/24/24 0347 11/25/24 0339 11/26/24 0337 11/27/24 0559 11/28/24 0516  NA  --    < > 135 136 136 136 134* 135  K  --    < > 4.8 4.5 5.2* 5.4* 5.6* 5.4*  CL  --    < > 106 106 104 106 103 101  CO2  --    < > 19* 21* 22 22 19* 21*   ANIONGAP  --    < > 10 9 9 9 12 12   GLUCOSE  --    < > 122* 101* 121* 113* 93 89  BUN  --    < > 83* 85* 91* 92* 95* 95*  CREATININE  --    < > 5.20* 5.01* 4.72* 4.70* 4.55* 4.44*  AST 30  --   --   --   --   --   --   --   ALT  31  --   --   --   --   --   --   --   ALKPHOS 71  --   --   --   --   --   --   --   BILITOT 0.3  --   --   --   --   --   --   --   ALBUMIN  2.1*   < > 2.6* 2.6* 2.4* 2.6* 2.2*  --   LATICACIDVEN 2.2*  --   --   --   --   --   --   --   TSH 4.520*  --   --   --   --   --  4.420  --   MG  --   --  1.8 1.7 1.6* 2.0 1.9 1.8  PHOS  --    < > 2.3* 2.5 2.3* 2.8 3.5  --   CALCIUM   --    < > 7.3* 7.6* 7.4* 7.8* 7.6* 7.9*   < > = values in this interval not displayed.      Recent Labs  Lab 11/22/24 1420 11/22/24 1821 11/24/24 0347 11/25/24 0339 11/26/24 0337 11/27/24 0559 11/28/24 0516  LATICACIDVEN 2.2*  --   --   --   --   --   --   TSH 4.520*  --   --   --   --  4.420  --   MG  --    < > 1.7 1.6* 2.0 1.9 1.8  CALCIUM   --    < > 7.6* 7.4* 7.8* 7.6* 7.9*   < > = values in this interval not displayed.    --------------------------------------------------------------------------------------------------------------- Lab Results  Component Value Date   CHOL 101 03/07/2017   HDL 46 03/07/2017   LDLCALC 43 03/07/2017   TRIG 59 03/07/2017   CHOLHDL 2.2 03/07/2017    Lab Results  Component Value Date   HGBA1C 6.4 (H) 11/12/2024   Recent Labs    11/27/24 0559  TSH 4.420  FREET4 1.03    No results for input(s): VITAMINB12, FOLATE, FERRITIN, TIBC, IRON, RETICCTPCT in the last 72 hours. ------------------------------------------------------------------------------------------------------------------ Cardiac Enzymes No results for input(s): CKMB, TROPONINI, MYOGLOBIN in the last 168 hours.  Invalid input(s): CK  Micro Results No results found for this or any previous visit (from the past 240 hours).  Radiology Reports  No  results found.     Signature  -   Lavada Stank M.D on 11/28/2024 at 9:45 AM   -  To page go to [email or web address]

## 2024-11-28 NOTE — Progress Notes (Addendum)
 [facility] NEPHROLOGY PROGRESS NOTE  Assessment/ Plan: Pt is a 84 y.o. yo male with past medical history significant for HTN, HLD, CHF with preserved EF, CAD status post stent, seen by our team for AKI thought to be due to obstructive uropathy, signed off on 1/24, called us  back by primary team because of intermittent hyperkalemia and persistent AKI.  # Acute kidney injury due to obstructive uropathy: Patient was admitted to ICU for shock requiring pressor in the beginning, may have some component of ATN as well.  Seen by urology and IR, status post bilateral nephrostomy tube placement.  Patient is nonoliguric.  The peak creatinine level was 14 on admission and now it is holding around 4-5.  No overt uremic symptoms except generalized weakness.  Continue Lokelma  for the treatment of hyperkalemia. -Patient does not want to be on dialysis, verified with his daughter and son who were presented at bedside.  Fortunately, no acute indication for dialysis at this time. -Continue strict ins and outs and daily.  # Hyperkalemia due to low GFR, requirement of blood products etc.. Continue Lokelma  and changed to renal (low potassium) diet.  Discussed with nurse.  # Anemia due to acute critical illness, no sign of bleeding: Receiving blood transfusion, no ESA.  # Shock: Due to UTI and hypovolemia.  Blood pressure improved. On midodrine  now.  Continue to monitor.  # Metabolic acidosis: Continue sodium bicarbonate , follow serum CO2 level.  Discussed with the patient's son and daughter who are present at the bedside.  Subjective: Seen and examined.  He feels good this morning.  Surrounded by his son and daughter.  Urine output is documented around 750 cc.  Denies nausea, vomiting, chest pain or shortness of breath.  Objective Vital signs in last 24 hours: Vitals:   11/28/24 0630 11/28/24 0641 11/28/24 0800 11/28/24 1116  BP:   (!) 111/51 (!) 106/51  Pulse:   74 80  Resp:   15 (!) 22   Temp:   99.1 F (37.3 C) 98.9 F (37.2 C)  TempSrc:   Oral Oral  SpO2: (!) 89% (!) 87% 95% 95%  Weight:      Height:       Weight change: 1.7 kg  Intake/Output Summary (Last 24 hours) at 11/28/2024 1119 Last data filed at 11/28/2024 0357 Gross per 24 hour  Intake 670 ml  Output 750 ml  Net -80 ml       Labs: RENAL PANEL Recent Labs  Lab 11/23/24 0233 11/24/24 0347 11/25/24 0339 11/26/24 0337 11/27/24 0559 11/28/24 0516  NA 135 136 136 136 134* 135  K 4.8 4.5 5.2* 5.4* 5.6* 5.4*  CL 106 106 104 106 103 101  CO2 19* 21* 22 22 19* 21*  GLUCOSE 122* 101* 121* 113* 93 89  BUN 83* 85* 91* 92* 95* 95*  CREATININE 5.20* 5.01* 4.72* 4.70* 4.55* 4.44*  CALCIUM  7.3* 7.6* 7.4* 7.8* 7.6* 7.9*  MG 1.8 1.7 1.6* 2.0 1.9 1.8  PHOS 2.3* 2.5 2.3* 2.8 3.5  --   ALBUMIN  2.6* 2.6* 2.4* 2.6* 2.2*  --     Liver Function Tests: Recent Labs  Lab 11/22/24 1420 11/22/24 1821 11/25/24 0339 11/26/24 0337 11/27/24 0559  AST 30  --   --   --   --   ALT 31  --   --   --   --   ALKPHOS 71  --   --   --   --   BILITOT 0.3  --   --   --   --  PROT 4.3*  --   --   --   --   ALBUMIN  2.1*   < > 2.4* 2.6* 2.2*   < > = values in this interval not displayed.   No results for input(s): LIPASE, AMYLASE in the last 168 hours. No results for input(s): AMMONIA in the last 168 hours. CBC: Recent Labs    11/11/24 1643 11/12/24 0500 11/23/24 2028 11/25/24 0339 11/26/24 0337 11/27/24 0559 11/28/24 0516  HGB 7.7*   < > 8.2* 7.6* 7.4* 7.0* 8.4*  MCV 97.9   < > 97.3 98.3 98.3 98.6 95.4  VITAMINB12 1,349*  --   --   --   --   --   --   FOLATE 7.7  --   --   --   --   --   --   FERRITIN 938*  --   --   --   --   --   --   TIBC 121*  --   --   --   --   --   --   IRON 102  --   --   --   --   --   --   RETICCTPCT 2.8  --   --   --   --   --   --    < > = values in this interval not displayed.    Cardiac Enzymes: No results for input(s): CKTOTAL, CKMB, CKMBINDEX, TROPONINI in  the last 168 hours. CBG: Recent Labs  Lab 11/27/24 1155 11/27/24 1611 11/27/24 2124 11/28/24 0829 11/28/24 1115  GLUCAP 147* 104* 107* 90 136*    Iron Studies: No results for input(s): IRON, TIBC, TRANSFERRIN, FERRITIN in the last 72 hours. Studies/Results: No results found.   Medications: Infusions:   Scheduled Medications:  Chlorhexidine  Gluconate Cloth  6 each Topical Daily   Gerhardt's butt cream  1 Application Topical TID   insulin  aspart  0-9 Units Subcutaneous TID WC   midodrine   15 mg Oral TID WC   mirtazapine   15 mg Oral QHS   multivitamin  1 tablet Oral QHS   pantoprazole   40 mg Oral BID   saccharomyces boulardii  250 mg Oral BID   simethicone   80 mg Oral QID   sodium bicarbonate   1,300 mg Oral BID   sodium chloride  flush  5 mL Intracatheter Q12H   sodium zirconium cyclosilicate   10 g Oral BID    have reviewed scheduled and prn medications.  Physical Exam: General:NAD, comfortable Heart:RRR, s1s2 nl Lungs:clear b/l, no crackle Abdomen:soft, Non-tender, non-distended GU: B/l nephrostomy bag with clear urine.  Extremities:No edema:   Neurology: Alert, awake, oriented  Rekita Miotke Amelie Romney 11/28/2024,11:19 AM  LOS: 17 days

## 2024-11-29 DIAGNOSIS — N170 Acute kidney failure with tubular necrosis: Secondary | ICD-10-CM | POA: Diagnosis not present

## 2024-11-29 DIAGNOSIS — Z515 Encounter for palliative care: Secondary | ICD-10-CM | POA: Diagnosis not present

## 2024-11-29 DIAGNOSIS — Z7189 Other specified counseling: Secondary | ICD-10-CM | POA: Diagnosis not present

## 2024-11-29 LAB — GLUCOSE, CAPILLARY
Glucose-Capillary: 92 mg/dL (ref 70–99)
Glucose-Capillary: 122 mg/dL — ABNORMAL HIGH (ref 70–99)
Glucose-Capillary: 104 mg/dL — ABNORMAL HIGH (ref 70–99)
Glucose-Capillary: 120 mg/dL — ABNORMAL HIGH (ref 70–99)

## 2024-11-29 LAB — BASIC METABOLIC PANEL WITH GFR
Anion gap: 15 (ref 5–15)
BUN: 95 mg/dL — ABNORMAL HIGH (ref 8–23)
CO2: 20 mmol/L — ABNORMAL LOW (ref 22–32)
Calcium: 8.2 mg/dL — ABNORMAL LOW (ref 8.9–10.3)
Chloride: 100 mmol/L (ref 98–111)
Creatinine, Ser: 4.63 mg/dL — ABNORMAL HIGH (ref 0.61–1.24)
GFR, Estimated: 12 mL/min — ABNORMAL LOW
Glucose, Bld: 120 mg/dL — ABNORMAL HIGH (ref 70–99)
Potassium: 4.7 mmol/L (ref 3.5–5.1)
Sodium: 134 mmol/L — ABNORMAL LOW (ref 135–145)

## 2024-11-29 LAB — CBC WITH DIFFERENTIAL/PLATELET
Abs Immature Granulocytes: 0.05 10*3/uL (ref 0.00–0.07)
Basophils Absolute: 0 10*3/uL (ref 0.0–0.1)
Basophils Relative: 1 %
Eosinophils Absolute: 0.6 10*3/uL — ABNORMAL HIGH (ref 0.0–0.5)
Eosinophils Relative: 14 %
HCT: 28.5 % — ABNORMAL LOW (ref 39.0–52.0)
Hemoglobin: 9.5 g/dL — ABNORMAL LOW (ref 13.0–17.0)
Immature Granulocytes: 1 %
Lymphocytes Relative: 12 %
Lymphs Abs: 0.5 10*3/uL — ABNORMAL LOW (ref 0.7–4.0)
MCH: 31.9 pg (ref 26.0–34.0)
MCHC: 33.3 g/dL (ref 30.0–36.0)
MCV: 95.6 fL (ref 80.0–100.0)
Monocytes Absolute: 0.5 10*3/uL (ref 0.1–1.0)
Monocytes Relative: 11 %
Neutro Abs: 2.8 10*3/uL (ref 1.7–7.7)
Neutrophils Relative %: 61 %
Platelets: 248 10*3/uL (ref 150–400)
RBC: 2.98 MIL/uL — ABNORMAL LOW (ref 4.22–5.81)
RDW: 16.6 % — ABNORMAL HIGH (ref 11.5–15.5)
WBC: 4.6 10*3/uL (ref 4.0–10.5)
nRBC: 0 % (ref 0.0–0.2)

## 2024-11-29 LAB — PHOSPHORUS: Phosphorus: 5.1 mg/dL — ABNORMAL HIGH (ref 2.5–4.6)

## 2024-11-29 LAB — MAGNESIUM: Magnesium: 1.7 mg/dL (ref 1.7–2.4)

## 2024-11-29 NOTE — Plan of Care (Signed)

## 2024-11-29 NOTE — Progress Notes (Signed)
 [facility] Progress Note Patient: [name] FMW:[id number] DOB: [birth year]  DOA: 11/11/2024 DOS: the patient was seen and examined on 11/29/2024  Brief Summary: Patient is a 84 y.o.  male with history of CAD s/p PCI 2008, HFmrEF, HTN, HLD-presented with generalized weakness/fatigue-loss of appetite-found to be hypotensive with AKI-thought to have obstructive uropathy.  Initially admitted to the ICU for pressors-stabilized and subsequently transferred to [facility].   Significant events: 1/14>> admit to ICU 1/16>> received 1 unit PRBC. 1/19>> transferred to [facility]. 1/21>> bilateral PCN performed. 1/22>> 1 unit PRBC given. 1/24>> 1 more PRBC given 1/25>> US  renal mild bilateral hydronephrosis mild pleural effusion.  Significant studies: 1/14>> CXR: No PNA 1/14>> CT head: No acute intracranial process 1/14>> CT abdomen/pelvis: Severe bilateral hydronephrosis 1/14>> renal ultrasound: Severe bilateral hydronephrosis. 1/15>> echo: EF 55-60% 1/17>> renal ultrasound: Severe bilateral hydronephrosis 1/19>> x-ray KUB: Dilated loops of small bowel-at least consistent with a partial SBO. Pete CT scan 11/26/2024.  Shows bilateral nephrostomy tubes, decompressed ureters, nonspecific thickening in the urinary bladder   Significant microbiology data: 1/14>> COVID/influenza/RSV PCR: Negative 1/14>> blood culture: No growth 1/15>> urine culture: No growth   Consults: PCCM Nephrology Neurology IR   Assessment and plan:  AKI Secondary to obstructive uropathy Presents with decreased urine output.  Found to have AKI with hyperkalemia upon admission.  Concern for infection.  Admitted to the ICU for vasopressor. Seen by urology and IR, underwent bilateral nephrostomy tube placement. Also seen by nephrology Repeat CT scan noted on 11/26/2024, urology following.  Defer management of urological issues to urology. Renal function proved but still creatinine quite high with hyperkalemia, requested  nephrology to reevaluate on 11/27/2024, will give him a trial of packed RBC transfusion as well.  Family leaning more towards non-HD treatment, still not finally decided per daughter on 11/28/2024 Per urology family has decided to go to SNF with bilateral nephrostomy tubes with outpatient follow-up with Dr. Watt  hyperkalemia Lokelma .   Metabolic acidosis Due to AKI on oral bicarb   Multifactorial shock Secondary to septic shock due to UTI and hypovolemic shock from poor oral intake BP has stabilized with supportive care Briefly required pressors on initial presentation Blood pressure remains soft, IV fluids on 11/25/2024 Continue midodrine  and monitor.  Symptom-free appears nontoxic, random TSH and cortisol stable, 1 unit packed RBC on 11/27/2024   Complicated UTI Treated with Rocephin -although urine cultures negative-this was done after patient was given antibiotics   Ileus resolved. Patient with significant distention of his bowels. No definitive obstruction seen. Actually has a bowel movement and passing gas. Repeat x-ray on 1/23 shows improvement in ileus.   GERD Dyspeptic symptoms are much better Continue PPI As needed simethicone .   Normocytic anemia Acute blood loss anemia Likely secondary to critical illness-has some hematuria after PCN placement. Has received total 3 units of packed RBC transfusion last on 11/21/2024 Holding aspirin  and heparin  for DVT prophylaxis.  Continue twice daily PPI, no signs of active bleeding Follow CBC, type screen done will follow closely will give him 1 unit packed RBC on 11/27/2024   History of CAD s/p PCI 2008 No anginal symptoms   Chronic HFmrEF Euvolemic Diuretics on hold   HTN Blood pressure low currently on IV fluids and midodrine     Anorexia/failure to thrive syndrome Poor oral intake for the past several months Patient not keen on getting NG tube placed Thankfully slowly improving-continue to encourage oral intake-continue  supplements Remains on Remeron  Palliative care following.   Moderate Malnutrition Class  I obesity. Body mass index is 30.96 kg/m.  Etiology: acute illness Signs/Symptoms: mild fat depletion, mild muscle depletion Interventions: Refer to RD note for recommendations Placing the patient at high risk for outcome.  Stage II coccyx ulcer, upper back pressure ulcers. Present on admission. Monitor.  Hematuria. Bilateral PCN had bloody discharge.  Now resolved. IR consulted to reevaluate the PCN.  Recommended continue irrigation. Monitor H&H.  Hypoalbuminemia with third spacing. Albumin  level is 2. Has lower extremity swelling.  Bilateral pleural effusion. Suspect this is third spacing related to hypoalbuminemia. Will eventually need to resume diuresis.  Type of diabetes mellitus, well-controlled without long-term insulin  use with CKD. Currently on sliding scale insulin .  Lab Results  Component Value Date   HGBA1C 6.4 (H) 11/12/2024    CBG (last 3)  Recent Labs    11/28/24 1115 11/28/24 1716 11/28/24 2141  GLUCAP 136* 130* 127*    Goals of care conversation. 1/25 extensive discussion with patient at bedside.  Daughter and son at bedside as well. Patient feels that the way he is feeling right now he would like to go through an attempt of resuscitation understanding that there is a very high risk of his course after resuscitation may not be without any significant complications. Patient does not want to be on lifelong hemodialysis.  Patient also knows that he would likely be not a candidate for lifelong hemodialysis. Patient would like to attempt short-term ICU stay should he need 1.    DVT Prophylaxis: Place and maintain sequential compression device Start: 11/21/24 1110 SCDs Start: 11/11/24 1516   Data review I have Reviewed nursing notes, Vitals, and Lab results. Since last encounter, pertinent lab results CBC and BMP   . I have ordered test including CBC and BMP   . I have discussed pt's care plan and test results with urology  .    Physical exam. Vitals:   11/28/24 2000 11/29/24 0001 11/29/24 0431 11/29/24 0446  BP: 128/65 (!) 118/55  (!) 111/55  Pulse: 93 81  80  Resp: (!) 22 16  18   Temp: 99.1 F (37.3 C) 99.1 F (37.3 C)  98.6 F (37 C)  TempSrc: Oral Oral  Oral  SpO2: 96% 95%  96%  Weight:   94.4 kg   Height:       Awake Alert, No new F.N deficits, Normal affect [place].AT,PERRAL Supple Neck, No JVD,   Symmetrical Chest wall movement, Good air movement bilaterally, CTAB RRR,No Gallops, Rubs or new Murmurs,  +ve B.Sounds, Abd Soft, No tenderness, bilateral nephrostomy tubes in place No Cyanosis, Clubbing or edema    Subjective: Patient in bed, appears comfortable, denies any headache, no fever, no chest pain or pressure, no shortness of breath , no abdominal pain. No focal weakness.   Family Communication: daughter at bedside 11/25/24.  11/26/2024, 11/27/2024, 11/28/2024, 11/29/2024  Disposition Plan: Status is: Inpatient Remains inpatient appropriate because: Monitor for improvement in renal function.   Planned Discharge Destination: TBD Diet: Diet Order             Diet renal with fluid restriction Fluid restriction: 1200 mL Fluid; Room service appropriate? No; Fluid consistency: Thin  Diet effective now                    Data Review:   Patient Lines/Drains/Airways Status     Active Line/Drains/Airways     Name Placement date Placement time Site Days   Peripheral IV 11/20/24 22 G 1 Anterior;Left Forearm 11/20/24  1209  Forearm  9   Peripheral IV 11/27/24 22 G 1.75 Anterior;Right Forearm 11/27/24  1423  Forearm  2   Nephrostomy Left 10.2 Fr. 11/18/24  1006  Left  11   Nephrostomy Right 10.2 Fr. 11/18/24  1011  Right  11   Wound 11/11/24 1850 Pressure Injury Coccyx Mid Stage 2 -  Partial thickness loss of dermis presenting as a shallow open injury with a red, pink wound bed without slough. 11/11/24  1850  Coccyx  18    Wound 11/11/24 1850 Pressure Injury Back Mid;Upper Deep Tissue Pressure Injury - Purple or maroon localized area of discolored intact skin or blood-filled blister due to damage of underlying soft tissue from pressure and/or shear. 11/11/24  1850  Back  18   Wound 11/11/24 1850 Pressure Injury Head Posterior;Medial Deep Tissue Pressure Injury - Purple or maroon localized area of discolored intact skin or blood-filled blister due to damage of underlying soft tissue from pressure and/or shear. 11/11/24  1850  Head  18             Inpatient Medications  Scheduled Meds:  Chlorhexidine  Gluconate Cloth  6 each Topical Daily   Gerhardt's butt cream  1 Application Topical TID   insulin  aspart  0-9 Units Subcutaneous TID WC   midodrine   15 mg Oral TID WC   mirtazapine   15 mg Oral QHS   multivitamin  1 tablet Oral QHS   pantoprazole   40 mg Oral BID   saccharomyces boulardii  250 mg Oral BID   simethicone   80 mg Oral QID   sodium bicarbonate   1,300 mg Oral BID   sodium chloride  flush  5 mL Intracatheter Q12H   sodium zirconium cyclosilicate   10 g Oral BID   Continuous Infusions:   PRN Meds:.acetaminophen , ondansetron  (ZOFRAN ) IV, polyethylene glycol  DVT Prophylaxis  Place and maintain sequential compression device Start: 11/21/24 1110 SCDs Start: 11/11/24 1516   Recent Labs  Lab 11/23/24 2028 11/25/24 0339 11/26/24 0337 11/27/24 0559 11/28/24 0516  WBC 8.9 6.4 5.2 4.2 4.5  HGB 8.2* 7.6* 7.4* 7.0* 8.4*  HCT 24.8* 23.8* 22.9* 21.7* 25.1*  PLT 210 220 226 230 225  MCV 97.3 98.3 98.3 98.6 95.4  MCH 32.2 31.4 31.8 31.8 31.9  MCHC 33.1 31.9 32.3 32.3 33.5  RDW 18.0* 17.5* 17.2* 17.0* 17.7*  LYMPHSABS  --   --  0.6* 0.6* 0.6*  MONOABS  --   --  0.7 0.5 0.6  EOSABS  --   --  0.3 0.4 0.5  BASOSABS  --   --  0.0 0.0 0.0    Recent Labs  Lab 11/22/24 1420 11/22/24 1821 11/23/24 0233 11/24/24 0347 11/25/24 0339 11/26/24 0337 11/27/24 0559 11/28/24 0516  NA  --    < > 135  136 136 136 134* 135  K  --    < > 4.8 4.5 5.2* 5.4* 5.6* 5.4*  CL  --    < > 106 106 104 106 103 101  CO2  --    < > 19* 21* 22 22 19* 21*  ANIONGAP  --    < > 10 9 9 9 12 12   GLUCOSE  --    < > 122* 101* 121* 113* 93 89  BUN  --    < > 83* 85* 91* 92* 95* 95*  CREATININE  --    < > 5.20* 5.01* 4.72* 4.70* 4.55* 4.44*  AST 30  --   --   --   --   --   --   --  ALT 31  --   --   --   --   --   --   --   ALKPHOS 71  --   --   --   --   --   --   --   BILITOT 0.3  --   --   --   --   --   --   --   ALBUMIN  2.1*   < > 2.6* 2.6* 2.4* 2.6* 2.2*  --   LATICACIDVEN 2.2*  --   --   --   --   --   --   --   TSH 4.520*  --   --   --   --   --  4.420  --   MG  --    < > 1.8 1.7 1.6* 2.0 1.9 1.8  PHOS  --    < > 2.3* 2.5 2.3* 2.8 3.5  --   CALCIUM   --    < > 7.3* 7.6* 7.4* 7.8* 7.6* 7.9*   < > = values in this interval not displayed.      Recent Labs  Lab 11/22/24 1420 11/22/24 1821 11/24/24 0347 11/25/24 0339 11/26/24 0337 11/27/24 0559 11/28/24 0516  LATICACIDVEN 2.2*  --   --   --   --   --   --   TSH 4.520*  --   --   --   --  4.420  --   MG  --    < > 1.7 1.6* 2.0 1.9 1.8  CALCIUM   --    < > 7.6* 7.4* 7.8* 7.6* 7.9*   < > = values in this interval not displayed.    --------------------------------------------------------------------------------------------------------------- Lab Results  Component Value Date   CHOL 101 03/07/2017   HDL 46 03/07/2017   LDLCALC 43 03/07/2017   TRIG 59 03/07/2017   CHOLHDL 2.2 03/07/2017    Lab Results  Component Value Date   HGBA1C 6.4 (H) 11/12/2024   Recent Labs    11/27/24 0559  TSH 4.420  FREET4 1.03    No results for input(s): VITAMINB12, FOLATE, FERRITIN, TIBC, IRON, RETICCTPCT in the last 72 hours. ------------------------------------------------------------------------------------------------------------------ Cardiac Enzymes No results for input(s): CKMB, TROPONINI, MYOGLOBIN in the last 168  hours.  Invalid input(s): CK  Micro Results No results found for this or any previous visit (from the past 240 hours).  Radiology Reports  No results found.     Signature  -   Lavada Stank M.D on 11/29/2024 at 8:36 AM   -  To page go to [email or web address]

## 2024-11-29 NOTE — Progress Notes (Signed)
 [name] NEPHROLOGY PROGRESS NOTE  Assessment/ Plan: Pt is a 84 y.o. yo male with past medical history significant for HTN, HLD, CHF with preserved EF, CAD status post stent, seen by our team for AKI thought to be due to obstructive uropathy, signed off on 1/24, called us  back by primary team because of intermittent hyperkalemia and persistent AKI.  # Acute kidney injury due to obstructive uropathy: Patient was admitted to ICU for shock requiring pressor in the beginning, may have some component of ATN as well.  Seen by urology and IR, status post bilateral nephrostomy tube placement.  Patient is nonoliguric.  The peak creatinine level was 14 on admission and now it is holding around 4-5.  No overt uremic symptoms except generalized weakness.  Continue Lokelma  for the treatment of hyperkalemia. -Patient does not want to be on dialysis, verified with his daughter and son who were presented at bedside.  Fortunately, no acute indication for dialysis at this time. -Continue strict ins and outs and daily.  # Hyperkalemia due to low GFR, requirement of blood products etc.. Continue Lokelma  and changed to renal (low potassium) diet.  Discussed with nurse.  # Anemia due to acute critical illness, no sign of bleeding: Receiving blood transfusion, no ESA.  # Shock: Due to UTI and hypovolemia.  Blood pressure improved. On midodrine  now.  Continue to monitor.  # Metabolic acidosis: Continue sodium bicarbonate , follow serum CO2 level.  Discussed with the patient's son and daughter who are present at the bedside.  Subjective: Seen and examined.  He feels good this morning.  Surrounded by his son and daughter.  Urine output is documented around 2.2 L.  Denies nausea, vomiting, chest pain or shortness of breath.  Objective Vital signs in last 24 hours: Vitals:   11/28/24 2000 11/29/24 0001 11/29/24 0431 11/29/24 0446  BP: 128/65 (!) 118/55  (!) 111/55  Pulse: 93 81  80  Resp: (!) 22 16   18   Temp: 99.1 F (37.3 C) 99.1 F (37.3 C)  98.6 F (37 C)  TempSrc: Oral Oral  Oral  SpO2: 96% 95%  96%  Weight:   94.4 kg   Height:       Weight change: 2.4 kg  Intake/Output Summary (Last 24 hours) at 11/29/2024 1155 Last data filed at 11/29/2024 0934 Gross per 24 hour  Intake 20 ml  Output 1625 ml  Net -1605 ml       Labs: RENAL PANEL Recent Labs  Lab 11/23/24 0233 11/24/24 0347 11/25/24 0339 11/26/24 0337 11/27/24 0559 11/28/24 0516 11/29/24 0915 11/29/24 0916  NA 135 136 136 136 134* 135  --  134*  K 4.8 4.5 5.2* 5.4* 5.6* 5.4*  --  4.7  CL 106 106 104 106 103 101  --  100  CO2 19* 21* 22 22 19* 21*  --  20*  GLUCOSE 122* 101* 121* 113* 93 89  --  120*  BUN 83* 85* 91* 92* 95* 95*  --  95*  CREATININE 5.20* 5.01* 4.72* 4.70* 4.55* 4.44*  --  4.63*  CALCIUM  7.3* 7.6* 7.4* 7.8* 7.6* 7.9*  --  8.2*  MG 1.8 1.7 1.6* 2.0 1.9 1.8 1.7  --   PHOS 2.3* 2.5 2.3* 2.8 3.5  --  5.1*  --   ALBUMIN  2.6* 2.6* 2.4* 2.6* 2.2*  --   --   --     Liver Function Tests: Recent Labs  Lab 11/22/24 1420 11/22/24 1821 11/25/24 0339 11/26/24 9662  11/27/24 0559  AST 30  --   --   --   --   ALT 31  --   --   --   --   ALKPHOS 71  --   --   --   --   BILITOT 0.3  --   --   --   --   PROT 4.3*  --   --   --   --   ALBUMIN  2.1*   < > 2.4* 2.6* 2.2*   < > = values in this interval not displayed.   No results for input(s): LIPASE, AMYLASE in the last 168 hours. No results for input(s): AMMONIA in the last 168 hours. CBC: Recent Labs    11/11/24 1643 11/12/24 0500 11/25/24 0339 11/26/24 0337 11/27/24 0559 11/28/24 0516 11/29/24 0915  HGB 7.7*   < > 7.6* 7.4* 7.0* 8.4* 9.5*  MCV 97.9   < > 98.3 98.3 98.6 95.4 95.6  VITAMINB12 1,349*  --   --   --   --   --   --   FOLATE 7.7  --   --   --   --   --   --   FERRITIN 938*  --   --   --   --   --   --   TIBC 121*  --   --   --   --   --   --   IRON 102  --   --   --   --   --   --   RETICCTPCT 2.8  --   --   --   --    --   --    < > = values in this interval not displayed.    Cardiac Enzymes: No results for input(s): CKTOTAL, CKMB, CKMBINDEX, TROPONINI in the last 168 hours. CBG: Recent Labs  Lab 11/28/24 0829 11/28/24 1115 11/28/24 1716 11/28/24 2141 11/29/24 0853  GLUCAP 90 136* 130* 127* 92    Iron Studies: No results for input(s): IRON, TIBC, TRANSFERRIN, FERRITIN in the last 72 hours. Studies/Results: No results found.   Medications: Infusions:   Scheduled Medications:  Chlorhexidine  Gluconate Cloth  6 each Topical Daily   Gerhardt's butt cream  1 Application Topical TID   insulin  aspart  0-9 Units Subcutaneous TID WC   midodrine   15 mg Oral TID WC   mirtazapine   15 mg Oral QHS   multivitamin  1 tablet Oral QHS   pantoprazole   40 mg Oral BID   saccharomyces boulardii  250 mg Oral BID   simethicone   80 mg Oral QID   sodium bicarbonate   1,300 mg Oral BID   sodium chloride  flush  5 mL Intracatheter Q12H   sodium zirconium cyclosilicate   10 g Oral BID    have reviewed scheduled and prn medications.  Physical Exam: General:NAD, comfortable Heart:RRR, s1s2 nl Lungs:clear b/l, no crackle Abdomen:soft, Non-tender, non-distended GU: B/l nephrostomy bag with clear urine.  Extremities:No edema:   Neurology: Alert, awake, oriented  [name] [name] 11/29/2024,11:55 AM  LOS: 18 days

## 2024-11-30 DIAGNOSIS — Z7189 Other specified counseling: Secondary | ICD-10-CM | POA: Diagnosis not present

## 2024-11-30 DIAGNOSIS — Z515 Encounter for palliative care: Secondary | ICD-10-CM | POA: Diagnosis not present

## 2024-11-30 LAB — RENAL FUNCTION PANEL
Albumin: 2.6 g/dL — ABNORMAL LOW (ref 3.5–5.0)
Anion gap: 13 (ref 5–15)
BUN: 93 mg/dL — ABNORMAL HIGH (ref 8–23)
CO2: 23 mmol/L (ref 22–32)
Calcium: 8.2 mg/dL — ABNORMAL LOW (ref 8.9–10.3)
Chloride: 100 mmol/L (ref 98–111)
Creatinine, Ser: 4.71 mg/dL — ABNORMAL HIGH (ref 0.61–1.24)
GFR, Estimated: 12 mL/min — ABNORMAL LOW
Glucose, Bld: 107 mg/dL — ABNORMAL HIGH (ref 70–99)
Phosphorus: 5.6 mg/dL — ABNORMAL HIGH (ref 2.5–4.6)
Potassium: 4.5 mmol/L (ref 3.5–5.1)
Sodium: 136 mmol/L (ref 135–145)

## 2024-11-30 LAB — GLUCOSE, CAPILLARY
Glucose-Capillary: 128 mg/dL — ABNORMAL HIGH (ref 70–99)
Glucose-Capillary: 143 mg/dL — ABNORMAL HIGH (ref 70–99)
Glucose-Capillary: 146 mg/dL — ABNORMAL HIGH (ref 70–99)
Glucose-Capillary: 167 mg/dL — ABNORMAL HIGH (ref 70–99)

## 2024-11-30 MED ORDER — SODIUM ZIRCONIUM CYCLOSILICATE 10 G PO PACK
10.0000 g | PACK | Freq: Every day | ORAL | Status: DC
  Administered 2024-12-01: 10 g via ORAL
  Filled 2024-11-30: qty 1

## 2024-11-30 MED ORDER — MIDODRINE HCL 5 MG PO TABS
10.0000 mg | ORAL_TABLET | Freq: Three times a day (TID) | ORAL | Status: DC
  Administered 2024-11-30 (×3): 10 mg via ORAL
  Filled 2024-11-30 (×4): qty 2

## 2024-11-30 MED ORDER — FUROSEMIDE 10 MG/ML IJ SOLN
40.0000 mg | Freq: Once | INTRAMUSCULAR | Status: AC
  Administered 2024-11-30: 40 mg via INTRAVENOUS
  Filled 2024-11-30: qty 4

## 2024-11-30 MED ORDER — TORSEMIDE 20 MG PO TABS
40.0000 mg | ORAL_TABLET | Freq: Two times a day (BID) | ORAL | Status: DC
  Administered 2024-11-30 – 2024-12-02 (×4): 40 mg via ORAL
  Filled 2024-11-30 (×5): qty 2

## 2024-11-30 NOTE — TOC Progression Note (Signed)
 Transition of Care [facility]) - Progression Note    Patient Details  Name: [name] MRN: [id number] Date of Birth: [birth year]  Transition of Care [facility]) CM/SW Contact  Inocente GORMAN Kindle, LCSW Phone Number: 11/30/2024, 11:41 AM  Clinical Narrative:    CSW provided update to [facility].    Expected Discharge Plan: Skilled Nursing Facility Barriers to Discharge: Continued Medical Work up, English As A [employer] Language Teacher               Expected Discharge Plan and Services In-house Referral: Clinical Social Work     Living arrangements for the past 2 months: Single Family Home                                       Social Drivers of Health (SDOH) Interventions SDOH Screenings   Food Insecurity: No Food Insecurity (11/11/2024)  Housing: Low Risk (11/11/2024)  Transportation Needs: No Transportation Needs (11/11/2024)  Utilities: Not At Risk (11/11/2024)  Social Connections: Moderately Integrated (11/11/2024)  Tobacco Use: Medium Risk (11/11/2024)    Readmission Risk Interventions     No data to display

## 2024-11-30 NOTE — Plan of Care (Signed)

## 2024-11-30 NOTE — Plan of Care (Signed)
 Patient is progressing towards goals of care.     Problem: Education: Goal: Knowledge of General Education information will improve Description: Including pain rating scale, medication(s)/side effects and non-pharmacologic comfort measures Outcome: Progressing   Problem: Health Behavior/Discharge Planning: Goal: Ability to manage health-related needs will improve Outcome: Progressing   Problem: Clinical Measurements: Goal: Ability to maintain clinical measurements within normal limits will improve Outcome: Progressing Goal: Will remain free from infection Outcome: Progressing Goal: Diagnostic test results will improve Outcome: Progressing Goal: Respiratory complications will improve Outcome: Progressing Goal: Cardiovascular complication will be avoided Outcome: Progressing   Problem: Activity: Goal: Risk for activity intolerance will decrease Outcome: Progressing   Problem: Nutrition: Goal: Adequate nutrition will be maintained Outcome: Progressing   Problem: Coping: Goal: Level of anxiety will decrease Outcome: Progressing   Problem: Elimination: Goal: Will not experience complications related to bowel motility Outcome: Progressing Goal: Will not experience complications related to urinary retention Outcome: Progressing   Problem: Pain Managment: Goal: General experience of comfort will improve and/or be controlled Outcome: Progressing   Problem: Safety: Goal: Ability to remain free from injury will improve Outcome: Progressing   Problem: Skin Integrity: Goal: Risk for impaired skin integrity will decrease Outcome: Progressing   Problem: Education: Goal: Ability to describe self-care measures that may prevent or decrease complications (Diabetes Survival Skills Education) will improve Outcome: Progressing Goal: Individualized Educational Video(s) Outcome: Progressing   Problem: Coping: Goal: Ability to adjust to condition or change in health will  improve Outcome: Progressing   Problem: Fluid Volume: Goal: Ability to maintain a balanced intake and output will improve Outcome: Progressing   Problem: Health Behavior/Discharge Planning: Goal: Ability to identify and utilize available resources and services will improve Outcome: Progressing Goal: Ability to manage health-related needs will improve Outcome: Progressing   Problem: Metabolic: Goal: Ability to maintain appropriate glucose levels will improve Outcome: Progressing   Problem: Nutritional: Goal: Maintenance of adequate nutrition will improve Outcome: Progressing Goal: Progress toward achieving an optimal weight will improve Outcome: Progressing   Problem: Skin Integrity: Goal: Risk for impaired skin integrity will decrease Outcome: Progressing   Problem: Tissue Perfusion: Goal: Adequacy of tissue perfusion will improve Outcome: Progressing

## 2024-12-01 DIAGNOSIS — N170 Acute kidney failure with tubular necrosis: Secondary | ICD-10-CM | POA: Diagnosis not present

## 2024-12-01 LAB — BASIC METABOLIC PANEL WITH GFR
Anion gap: 13 (ref 5–15)
BUN: 93 mg/dL — ABNORMAL HIGH (ref 8–23)
CO2: 25 mmol/L (ref 22–32)
Calcium: 8 mg/dL — ABNORMAL LOW (ref 8.9–10.3)
Chloride: 99 mmol/L (ref 98–111)
Creatinine, Ser: 4.67 mg/dL — ABNORMAL HIGH (ref 0.61–1.24)
GFR, Estimated: 12 mL/min — ABNORMAL LOW
Glucose, Bld: 107 mg/dL — ABNORMAL HIGH (ref 70–99)
Potassium: 4.1 mmol/L (ref 3.5–5.1)
Sodium: 137 mmol/L (ref 135–145)

## 2024-12-01 LAB — GLUCOSE, CAPILLARY
Glucose-Capillary: 115 mg/dL — ABNORMAL HIGH (ref 70–99)
Glucose-Capillary: 134 mg/dL — ABNORMAL HIGH (ref 70–99)
Glucose-Capillary: 170 mg/dL — ABNORMAL HIGH (ref 70–99)
Glucose-Capillary: 138 mg/dL — ABNORMAL HIGH (ref 70–99)

## 2024-12-01 MED ORDER — MIDODRINE HCL 5 MG PO TABS: 5.0000 mg | ORAL_TABLET | Freq: Three times a day (TID) | ORAL | Status: DC

## 2024-12-01 MED ORDER — SODIUM BICARBONATE 650 MG PO TABS: 650.0000 mg | ORAL_TABLET | Freq: Two times a day (BID) | ORAL | Status: DC

## 2024-12-01 NOTE — Progress Notes (Signed)
 OT Cancellation Note  Patient Details Name: [name] MRN: [id number] DOB: August 29, 1941   Cancelled Treatment:    Reason Eval/Treat Not Completed: Other (comment). Pt requesting OT return at later time to engage in therapy session. OT to follow up as appropriate and schedule allows.   [name], OTR/LSABRA  [facility] Acute Rehabilitation  Office: [phone]   [name] [name] 12/01/2024, 8:11 AM

## 2024-12-01 NOTE — Progress Notes (Signed)
 [facility] NEPHROLOGY PROGRESS NOTE  Assessment/ Plan: Pt is a 84 y.o. yo male with past medical history significant for HTN, HLD, CHF with preserved EF, CAD status post stent, seen by our team for AKI thought to be due to obstructive uropathy, signed off on 1/24, called us  back by primary team because of intermittent hyperkalemia and persistent AKI.  # Acute kidney injury due to obstructive uropathy: Unclear baseline.  Patient was admitted to ICU for shock requiring pressor in the beginning, may have some component of ATN as well.  Seen by urology and IR, status post bilateral nephrostomy tube placement.  Patient is nonoliguric.  The peak creatinine level was 14 on admission and now it is holding around 4-5.  No overt uremic symptoms. - Patient has expressed in the past that he does not want dialysis.  When asked if he would want dialysis if it was decision between life and death he is not sure.  Fortunately do not need to answer this question right now but should continue to discuss this with his family. - Only time will tell if creatinine continues to improve.  Continue to monitor daily - Will need outpatient follow-up with us  at the time of discharge  # Hyperkalemia: Likely associated with obstruction causing some degree of type IV RTA also AKI.  Improved.  Stop Lokelma .  Stop bicarbonate  # Volume overload: Significant edema on exam.  Did not have edema before he came to the hospital.  Likely associated with AKI.  Good response to torsemide .  Continue 40 mg twice daily  # Anemia due to acute critical illness, no sign of bleeding: Infuse as needed  # Shock: Due to UTI and hypovolemia.  Blood pressure improved. On midodrine  now.  Can stop  # Metabolic acidosis: Resolved.  Stop oral bicarbonate  Discussed with the patient's son and daughter who are present at the bedside.  Subjective: Urine output over the past 24 hours.  Feeling more short of breath  today.  Objective Vital signs in last 24 hours: Vitals:   12/01/24 0400 12/01/24 0600 12/01/24 0605 12/01/24 0742  BP: 131/63  128/63 128/67  Pulse: 90 86 87 90  Resp: (!) 25 19 20  (!) 23  Temp:    98.6 F (37 C)  TempSrc:    Oral  SpO2:  92% 91% 92%  Weight:      Height:       Weight change:   Intake/Output Summary (Last 24 hours) at 12/01/2024 1052 Last data filed at 12/01/2024 0841 Gross per 24 hour  Intake 60 ml  Output 2600 ml  Net -2540 ml       Labs: RENAL PANEL Recent Labs  Lab 11/25/24 0339 11/26/24 0337 11/27/24 0559 11/28/24 0516 11/29/24 0915 11/29/24 0916 11/30/24 0756 12/01/24 0704  NA 136 136 134* 135  --  134* 136 137  K 5.2* 5.4* 5.6* 5.4*  --  4.7 4.5 4.1  CL 104 106 103 101  --  100 100 99  CO2 22 22 19* 21*  --  20* 23 25  GLUCOSE 121* 113* 93 89  --  120* 107* 107*  BUN 91* 92* 95* 95*  --  95* 93* 93*  CREATININE 4.72* 4.70* 4.55* 4.44*  --  4.63* 4.71* 4.67*  CALCIUM  7.4* 7.8* 7.6* 7.9*  --  8.2* 8.2* 8.0*  MG 1.6* 2.0 1.9 1.8 1.7  --   --   --   PHOS 2.3* 2.8 3.5  --  5.1*  --  5.6*  --   ALBUMIN  2.4* 2.6* 2.2*  --   --   --  2.6*  --     Liver Function Tests: Recent Labs  Lab 11/26/24 0337 11/27/24 0559 11/30/24 0756  ALBUMIN  2.6* 2.2* 2.6*   No results for input(s): LIPASE, AMYLASE in the last 168 hours. No results for input(s): AMMONIA in the last 168 hours. CBC: Recent Labs    11/11/24 1643 11/12/24 0500 11/25/24 0339 11/26/24 0337 11/27/24 0559 11/28/24 0516 11/29/24 0915  HGB 7.7*   < > 7.6* 7.4* 7.0* 8.4* 9.5*  MCV 97.9   < > 98.3 98.3 98.6 95.4 95.6  VITAMINB12 1,349*  --   --   --   --   --   --   FOLATE 7.7  --   --   --   --   --   --   FERRITIN 938*  --   --   --   --   --   --   TIBC 121*  --   --   --   --   --   --   IRON 102  --   --   --   --   --   --   RETICCTPCT 2.8  --   --   --   --   --   --    < > = values in this interval not displayed.    Cardiac Enzymes: No results for input(s):  CKTOTAL, CKMB, CKMBINDEX, TROPONINI in the last 168 hours. CBG: Recent Labs  Lab 11/30/24 0817 11/30/24 1155 11/30/24 1555 11/30/24 2002 12/01/24 0744  GLUCAP 128* 143* 146* 167* 115*    Iron Studies: No results for input(s): IRON, TIBC, TRANSFERRIN, FERRITIN in the last 72 hours. Studies/Results: No results found.   Medications: Infusions:   Scheduled Medications:  Chlorhexidine  Gluconate Cloth  6 each Topical Daily   Gerhardt's butt cream  1 Application Topical TID   midodrine   5 mg Oral TID WC   mirtazapine   15 mg Oral QHS   multivitamin  1 tablet Oral QHS   pantoprazole   40 mg Oral BID   saccharomyces boulardii  250 mg Oral BID   simethicone   80 mg Oral QID   sodium bicarbonate   650 mg Oral BID   sodium chloride  flush  5 mL Intracatheter Q12H   torsemide   40 mg Oral BID    have reviewed scheduled and prn medications.  Physical Exam: General:NAD, comfortable Heart: Normal rate, no rub Lungs: Chest rise with no increased work of breathing Abdomen:soft, Non-tender, non-distended GU: B/l nephrostomy bag with clear urine.  Extremities: 2+ pitting edema in the bilateral arms, 1+ pitting edema in the lower extremities Neurology: Alert, awake, oriented  [name] 12/01/2024,10:52 AM  LOS: 20 days

## 2024-12-01 NOTE — Progress Notes (Signed)
 Occupational Therapy Treatment Patient Details Name: [name] MRN: [id number] DOB: 1941/06/29 Today's Date: 12/01/2024   History of present illness 84 y.o. male admitted 11/11/24 with weakness, fatigue, decreased appetite, decreased urine output. Workup for severe AKI, septic shock secondary to UTI, severe bilateral hydronephrosis. Transfer out of ICU 1/18. Imaging 1/19 consistent with partial SBO. PMH includes HTN, HLD, DM, NSTEMI, ischemic cardiomyopathy, HF, CVA, heart murmur.   OT comments  Pt is progressing towards OT functional goals, seen for goal update this session. Current goals updated to reflect Pt progress and priorities. Pt required up to Min A for functional transfers OOB this session. Continues to require increased assistance for LB ADL tasks. OT to continue to follow Pt acutely to facilitate progress towards goals. Continue per updated POC.       If plan is discharge home, recommend the following:  A little help with walking and/or transfers;A little help with bathing/dressing/bathroom;Assistance with cooking/housework;Assist for transportation;Help with stairs or ramp for entrance   Equipment Recommendations  None recommended by OT    Recommendations for Other Services      Precautions / Restrictions Precautions Precautions: Fall Recall of Precautions/Restrictions: Intact Precaution/Restrictions Comments: watch BP; B nephrostomy tubes Restrictions Weight Bearing Restrictions Per Provider Order: No       Mobility Bed Mobility Overal bed mobility: Needs Assistance Bed Mobility: Supine to Sit, Sit to Supine     Supine to sit: Min assist, HOB elevated, Used rails Sit to supine: Min assist   General bed mobility comments: Min A to come to EOB and scoot forward. Pt required Min A to return to supine for management of BLE. Cues for sequencing bed mobility    Transfers Overall transfer level: Needs assistance Equipment used: Rolling walker (2  wheels) Transfers: Sit to/from Stand Sit to Stand: Min assist, From elevated surface           General transfer comment: STS x2 from EOB with Min A and cues for forward rocking motion to initiate movement. Pt required cues to fully extend trunk. Increased anxiety related to transfers OOB. Able to take small steps to the R with CGA and verbal cues for sequencing. Did not progress mobility further as chaplain arrived with notaries.     Balance Overall balance assessment: Needs assistance Sitting-balance support: No upper extremity supported, Feet supported Sitting balance-Leahy Scale: Good Sitting balance - Comments: EOB   Standing balance support: Bilateral upper extremity supported, During functional activity, Reliant on assistive device for balance Standing balance-Leahy Scale: Poor Standing balance comment: Dependent on RW and external support                           ADL either performed or assessed with clinical judgement   ADL Overall ADL's : Needs assistance/impaired Eating/Feeding: Set up;Sitting                   Lower Body Dressing: Maximal assistance;Sitting/lateral leans Lower Body Dressing Details (indicate cue type and reason): socks; needing increased time overall and assist with R sock                    Extremity/Trunk Assessment Upper Extremity Assessment Upper Extremity Assessment: Overall WFL for tasks assessed            Vision       Perception     Praxis     Communication Communication Communication: Impaired Factors Affecting Communication: Hearing impaired  Cognition Arousal: Alert Behavior During Therapy: WFL for tasks assessed/performed Cognition: No apparent impairments                               Following commands: Intact Following commands impaired: Only follows one step commands consistently, Follows multi-step commands with increased time      Cueing   Cueing Techniques: Verbal cues,  Visual cues, Tactile cues  Exercises      Shoulder Instructions       General Comments Increased HR to 120's with mobility.    Pertinent Vitals/ Pain       Pain Assessment Pain Assessment: Faces Faces Pain Scale: Hurts a little bit Pain Location: Bilateral feet Pain Descriptors / Indicators: Discomfort, Guarding Pain Intervention(s): Limited activity within patient's tolerance, Monitored during session, Repositioned  Home Living                                          Prior Functioning/Environment              Frequency  Min 1X/week        Progress Toward Goals  OT Goals(current goals can now be found in the care plan section)  Progress towards OT goals: Goals updated  Acute Rehab OT Goals Patient Stated Goal: to get better OT Goal Formulation: With patient Time For Goal Achievement: 12/15/24 Potential to Achieve Goals: Good ADL Goals Pt Will Perform Grooming: with contact guard assist;standing Pt Will Perform Lower Body Dressing: with min assist;sit to/from stand Pt Will Transfer to Toilet: with supervision;stand pivot transfer;bedside commode Additional ADL Goal #1: Pt will engage in bed mobility with supervision as a precursor to ADL tasks OOB. Additional ADL Goal #2: Pt will tolerate 5 minute ADL task OOB to address decreased activity tolerance.  Plan      Co-evaluation                 AM-PAC OT 6 Clicks Daily Activity     Outcome Measure   Help from another person eating meals?: None Help from another person taking care of personal grooming?: None Help from another person toileting, which includes using toliet, bedpan, or urinal?: A Lot Help from another person bathing (including washing, rinsing, drying)?: A Little Help from another person to put on and taking off regular upper body clothing?: A Little Help from another person to put on and taking off regular lower body clothing?: A Lot 6 Click Score: 18    End of  Session Equipment Utilized During Treatment: Gait belt;Rolling walker (2 wheels)  OT Visit Diagnosis: Unsteadiness on feet (R26.81);Muscle weakness (generalized) (M62.81);Other symptoms and signs involving cognitive function   Activity Tolerance Patient tolerated treatment well   Patient Left in bed;with call bell/phone within reach;with bed alarm set;with family/visitor present   Nurse Communication          Time: 8679-8661 OT Time Calculation (min): 18 min  Charges: OT General Charges $OT Visit: 1 Visit OT Treatments $Therapeutic Activity: 8-22 mins  Maurilio CROME, OTR/L.  [facility]  Office: [phone]   Maurilio PARAS Allianna Beaubien 12/01/2024, 3:26 PM

## 2024-12-02 LAB — CBC WITH DIFFERENTIAL/PLATELET
Abs Immature Granulocytes: 0.08 10*3/uL — ABNORMAL HIGH (ref 0.00–0.07)
Basophils Absolute: 0 10*3/uL (ref 0.0–0.1)
Basophils Relative: 0 %
Eosinophils Absolute: 0.9 10*3/uL — ABNORMAL HIGH (ref 0.0–0.5)
Eosinophils Relative: 14 %
HCT: 25.1 % — ABNORMAL LOW (ref 39.0–52.0)
Hemoglobin: 8.3 g/dL — ABNORMAL LOW (ref 13.0–17.0)
Immature Granulocytes: 1 %
Lymphocytes Relative: 11 %
Lymphs Abs: 0.7 10*3/uL (ref 0.7–4.0)
MCH: 31.7 pg (ref 26.0–34.0)
MCHC: 33.1 g/dL (ref 30.0–36.0)
MCV: 95.8 fL (ref 80.0–100.0)
Monocytes Absolute: 0.6 10*3/uL (ref 0.1–1.0)
Monocytes Relative: 9 %
Neutro Abs: 4 10*3/uL (ref 1.7–7.7)
Neutrophils Relative %: 65 %
Platelets: 261 10*3/uL (ref 150–400)
RBC: 2.62 MIL/uL — ABNORMAL LOW (ref 4.22–5.81)
RDW: 15.7 % — ABNORMAL HIGH (ref 11.5–15.5)
WBC: 6.3 10*3/uL (ref 4.0–10.5)
nRBC: 0 % (ref 0.0–0.2)

## 2024-12-02 LAB — BASIC METABOLIC PANEL WITH GFR
Anion gap: 12 (ref 5–15)
BUN: 93 mg/dL — ABNORMAL HIGH (ref 8–23)
CO2: 27 mmol/L (ref 22–32)
Calcium: 7.9 mg/dL — ABNORMAL LOW (ref 8.9–10.3)
Chloride: 98 mmol/L (ref 98–111)
Creatinine, Ser: 4.51 mg/dL — ABNORMAL HIGH (ref 0.61–1.24)
GFR, Estimated: 12 mL/min — ABNORMAL LOW
Glucose, Bld: 102 mg/dL — ABNORMAL HIGH (ref 70–99)
Potassium: 4.1 mmol/L (ref 3.5–5.1)
Sodium: 137 mmol/L (ref 135–145)

## 2024-12-02 LAB — PHOSPHORUS: Phosphorus: 6.2 mg/dL — ABNORMAL HIGH (ref 2.5–4.6)

## 2024-12-02 LAB — MAGNESIUM: Magnesium: 1.6 mg/dL — ABNORMAL LOW (ref 1.7–2.4)

## 2024-12-02 LAB — GLUCOSE, CAPILLARY: Glucose-Capillary: 104 mg/dL — ABNORMAL HIGH (ref 70–99)

## 2024-12-02 MED ORDER — INSULIN ASPART 100 UNIT/ML FLEXPEN: PEN_INJECTOR | SUBCUTANEOUS | Status: AC

## 2024-12-02 MED ORDER — MIRTAZAPINE 15 MG PO TBDP: 15.0000 mg | ORAL_TABLET | Freq: Every day | ORAL | Status: AC

## 2024-12-02 MED ORDER — POLYETHYLENE GLYCOL 3350 17 G PO PACK: 17.0000 g | PACK | Freq: Every day | ORAL | Status: AC | PRN

## 2024-12-02 MED ORDER — TORSEMIDE 40 MG PO TABS: 40.0000 mg | ORAL_TABLET | Freq: Two times a day (BID) | ORAL | Status: AC

## 2024-12-02 MED ORDER — PANTOPRAZOLE SODIUM 40 MG PO TBEC: 40.0000 mg | DELAYED_RELEASE_TABLET | Freq: Two times a day (BID) | ORAL | Status: AC

## 2024-12-02 MED ORDER — MAGNESIUM SULFATE 2 GM/50ML IV SOLN
2.0000 g | Freq: Once | INTRAVENOUS | Status: AC
  Administered 2024-12-02: 2 g via INTRAVENOUS
  Filled 2024-12-02: qty 50

## 2024-12-02 NOTE — Discharge Summary (Signed)
 "                                                                                                                                                                               Discharge summary note.  [name] FMW:[id number] DOB: 09/09/1941 DOA: 11/11/2024  PCP: Faythe Purchase, MD  Admit date: 11/11/2024  Discharge date: 12/02/2024  Admitted From: Home   Disposition:  SNF   Recommendations for Outpatient Follow-up:   Follow up with PCP in 1-2 weeks  PCP Please obtain BMP/CBC, 2 view CXR in 1week,  (see Discharge instructions)   PCP Please follow up on the following pending results:    Home Health: None   Equipment/Devices: None  Consultations: IR, nephrology, urology Discharge Condition: Stable    CODE STATUS: Full    Diet Recommendation: Renal diet with 1.2 L fluid restriction per day.  Check CBGs q. ACHS    Chief Complaint  Patient presents with   Weakness   no appetite     Brief history of present illness from the day of admission and additional interim summary     84 y.o.  male with history of CAD s/p PCI 2008, HFmrEF, HTN, HLD-presented with generalized weakness/fatigue-loss of appetite-found to be hypotensive with AKI-thought to have obstructive uropathy.  Initially admitted to the ICU for pressors-stabilized and subsequently transferred to [facility].   Significant events: 1/14>> admit to ICU 1/16>> received 1 unit PRBC. 1/19>> transferred to [facility]. 1/21>> bilateral PCN performed. 1/22>> 1 unit PRBC given. 1/24>> 1 more PRBC given 1/25>> US  renal mild bilateral hydronephrosis mild pleural effusion.   Significant studies: 1/14>> CXR: No PNA 1/14>> CT head: No acute intracranial process 1/14>> CT abdomen/pelvis: Severe bilateral hydronephrosis 1/14>> renal ultrasound: Severe bilateral hydronephrosis. 1/15>> echo: EF 55-60% 1/17>> renal ultrasound: Severe bilateral hydronephrosis 1/19>> x-ray KUB: Dilated loops of small bowel-at least consistent with a partial  SBO. Pete CT scan 11/26/2024.  Shows bilateral nephrostomy tubes, decompressed ureters, nonspecific thickening in the urinary bladder   Significant microbiology data: 1/14>> COVID/influenza/RSV PCR: Negative 1/14>> blood culture: No growth 1/15>> urine culture: No growth   Consults: PCCM Nephrology Neurology IR                                                                 Hospital Course   AKI Secondary to obstructive uropathy Presents with decreased urine output.  Found to have AKI with hyperkalemia upon admission.  Concern for infection.  Admitted to  the ICU for vasopressor. Seen by urology and IR, underwent bilateral nephrostomy tube placement. Also seen by nephrology Repeat CT scan noted on 11/26/2024, urology following.  Defer management of urological issues to urology. Renal function proved but still creatinine quite appreciate nephrology follow-up.  Per nephrology patient stable on oral diuretic regimen with outpatient follow-up with nephrology, family leaning more towards medical treatment and not on HD approach in the future.  Also seen by palliative care.  Will require outpatient palliative care follow-up at SNF along with ongoing nephrology follow-up. Per urology family has decided to go to SNF with bilateral nephrostomy tubes with outpatient follow-up with Dr. Watt  Will be discharged with bilateral nephrostomy tubes, oral diuretic with close outpatient follow-up with urology, IR and nephrology.  Monitor BMP closely at SNF.     Metabolic acidosis Due to AKI on oral bicarb   Multifactorial shock Secondary to septic shock due to UTI and hypovolemic shock from poor oral intake Resolved   Complicated UTI Completed treatment   Ileus resolved. Resolved no active issues having bowel movements   GERD Continue PPI   Normocytic anemia Acute blood loss anemia Likely secondary to critical illness-has some hematuria after PCN placement. Has received total 3 units of  packed RBC transfusion last on 11/21/2024 This has resolved, on PPI, resume aspirin  with caution and monitor CBC.   History of CAD s/p PCI 2008 No anginal symptoms   Chronic HFmrEF Does have some third spacing diuretics per renal   HTN Blood pressure stable, was briefly on midodrine  which has been discontinued since yesterday.  Monitor off of medications.    Anorexia/failure to thrive syndrome PT OT, SNF, continue outpatient palliative care follow-up and monitor   Moderate Malnutrition Class I obesity. Body mass index is 30.96 kg/m.  Needs dietary/nutritionist input at SNF as well.   Stage II coccyx ulcer, upper back pressure ulcers. Present on admission. Monitor.   Hypomagnesemia.  Replaced.    Hematuria. Bilateral PCN had bloody discharge.  Now resolved.    Hypoalbuminemia with third spacing. Improving with proper nutrition and diuretics   Type of diabetes mellitus, well-controlled without long-term insulin  use with CKD. Currently on sliding scale insulin .  Lab Results  Component Value Date   HGBA1C 6.4 (H) 11/12/2024   CBG (last 3)  Recent Labs    12/01/24 1625 12/01/24 2050 12/02/24 0810  GLUCAP 170* 138* 104*    Discharge diagnosis     Principal Problem:   Renal failure Active Problems:   Kidney failure, acute   Hypotension   Metabolic acidosis   Sepsis (HCC)   Urinary retention   Urinary tract infection with hematuria   Pressure injury of skin   Malnutrition of moderate degree    Discharge instructions    Discharge Instructions     Discharge instructions   Complete by: As directed    Follow with Primary MD Faythe Purchase, MD in 7 days   Get CBC, CMP, Magnesium , 2 view Chest X ray -  checked next visit with your primary MD or SNF MD   Activity: As tolerated with Full fall precautions use walker/cane & assistance as needed  Disposition SNF  Diet: Renal diet with 1200 cc fluid restriction  Special Instructions: If you have smoked or  chewed Tobacco  in the last 2 yrs please stop smoking, stop any regular Alcohol  and or any Recreational drug use.  On your next visit with your primary care physician please Get Medicines reviewed and adjusted.  Please request your Prim.MD to go over all Hospital Tests and Procedure/Radiological results at the follow up, please get all Hospital records sent to your Prim MD by signing hospital release before you go home.  If you experience worsening of your admission symptoms, develop shortness of breath, life threatening emergency, suicidal or homicidal thoughts you must seek medical attention immediately by calling 911 or calling your MD immediately  if symptoms less severe.  You Must read complete instructions/literature along with all the possible adverse reactions/side effects for all the Medicines you take and that have been prescribed to you. Take any new Medicines after you have completely understood and accpet all the possible adverse reactions/side effects.   Do not drive when taking Pain medications.  Do not take more than prescribed Pain, Sleep and Anxiety Medications  Wear Seat belts while driving.   Increase activity slowly   Complete by: As directed    No wound care   Complete by: As directed        Discharge Medications   Allergies as of 12/02/2024   No Known Allergies      Medication List     STOP taking these medications    carvedilol  12.5 MG tablet Commonly known as: COREG    Farxiga 5 MG Tabs tablet Generic drug: dapagliflozin propanediol   furosemide  20 MG tablet Commonly known as: LASIX    losartan  100 MG tablet Commonly known as: COZAAR    spironolactone  25 MG tablet Commonly known as: ALDACTONE        TAKE these medications    aspirin  81 MG tablet Take 81 mg by mouth at bedtime.   atorvastatin  80 MG tablet Commonly known as: LIPITOR Take 1 tablet (80 mg total) by mouth daily. What changed: when to take this   CENTRUM SILVER PO Take 1  tablet by mouth daily.   insulin  aspart 100 UNIT/ML FlexPen Commonly known as: NOVOLOG  Before each meal 3 times a day, 140-199 - 2 units, 200-250 - 4 units, 251-299 - 6 units,  300-349 - 8 units,  350 or above 10 units.   mirtazapine  15 MG disintegrating tablet Commonly known as: REMERON  SOL-TAB Take 1 tablet (15 mg total) by mouth at bedtime.   nitroGLYCERIN  0.4 MG SL tablet Commonly known as: NITROSTAT  Place 1 tablet (0.4 mg total) under the tongue every 5 (five) minutes as needed for chest pain (x 3 doses).   OCUVITE LUTEIN 25 PO Take 1 capsule by mouth at bedtime.   pantoprazole  40 MG tablet Commonly known as: PROTONIX  Take 1 tablet (40 mg total) by mouth 2 (two) times daily.   polyethylene glycol 17 g packet Commonly known as: MIRALAX  / GLYCOLAX  Take 17 g by mouth daily as needed for mild constipation.   Torsemide  40 MG Tabs Take 40 mg by mouth 2 (two) times daily.         Contact information for follow-up providers     Faythe Purchase, MD. Schedule an appointment as soon as possible for a visit in 1 week(s).   Specialty: Endocrinology Contact information: [address] [place] [address] [phone]         Watt Rush, MD. Schedule an appointment as soon as possible for a visit in 1 week(s).   Specialty: Urology Contact information: [address] [name] [address] [phone]         Philip Cornet, MD. Schedule an appointment as soon as possible for a visit in 1 week(s).   Specialties: Interventional Radiology,  Radiology Contact information: [address] [name] [address] [phone]         Macel Jayson PARAS, MD. Schedule an appointment as soon as possible for a visit in 1 week(s).   Specialty: Nephrology Contact information: [address] [name] [address] [phone]              Contact information for after-discharge care     Destination     [facility]    Service: Skilled Nursing Contact information: [address] [place]  [address] [phone]                     Major procedures and Radiology Reports - PLEASE review detailed and final reports thoroughly  -       CT ABDOMEN PELVIS WO CONTRAST Result Date: 11/26/2024 EXAM: CT ABDOMEN AND PELVIS WITHOUT CONTRAST 11/25/2024 06:44:50 PM TECHNIQUE: CT of the abdomen and pelvis was performed without the administration of intravenous contrast. Multiplanar reformatted images are provided for review. Automated exposure control, iterative reconstruction, and/or weight-based adjustment of the mA/kV was utilized to reduce the radiation dose to as low as reasonably achievable. COMPARISON: CT of the abdomen and pelvis dated 11/11/2024. CLINICAL HISTORY: bladder thickening, obstruction. Bladder thickening, obstruction. FINDINGS: LOWER CHEST: Mild bilateral dependent atelectasis and mild bilateral pleural effusions. Extensive calcific coronary artery disease again demonstrated. LIVER: The liver is unremarkable. GALLBLADDER AND BILE DUCTS: The gallbladder is contracted. No biliary ductal dilatation. SPLEEN: No acute abnormality. PANCREAS: No acute abnormality. ADRENAL GLANDS: No acute abnormality. KIDNEYS, URETERS AND BLADDER: Bilateral nephrostomy tubes have been placed, resulting in decompression of the previously noted bilateral hydroureteronephrosis. The ureters are no longer abnormally dilated. There is mild diffuse thickening of the wall of the urinary bladder, but it is considerably improved relative to the prior exam. There is a circumscribed fluid collection again seen abutting the dome of the urinary bladder, measuring approximately 3.84 x 2.5 cm. A foley catheter has been removed in the interim. No stones in the kidneys or ureters. No perinephric or periureteral stranding. GI AND BOWEL: Stomach demonstrates no acute abnormality. There are few sigmoid diverticula. There is  no evidence of diverticulitis. There is no bowel obstruction. PERITONEUM AND RETROPERITONEUM: No ascites. No free air. VASCULATURE: Aorta is normal in caliber. LYMPH NODES: No lymphadenopathy. REPRODUCTIVE ORGANS: The prostate gland is enlarged. BONES AND SOFT TISSUES: No acute osseous abnormality. There is subcutaneous soft tissue stranding laterally involving the abdomen and pelvis bilaterally. IMPRESSION: 1. Bilateral nephrostomy tubes in place, resulting in decompression of the previously noted bilateral hydroureteronephrosis. The ureters are no longer abnormally dilated. 2. Mild diffuse thickening of the wall of the urinary bladder, considerably improved relative to the prior exam. 3. Circumscribed fluid collection abutting the dome of the urinary bladder, measuring approximately 3.84 x 2.5 cm. 4. Mild bilateral pleural effusions and mild dependent atelectasis. 5. Enlarged prostate gland. 6. Extensive calcific coronary artery disease. 7. Subcutaneous soft tissue stranding laterally involving the abdomen and pelvis bilaterally. Electronically signed by: Evalene Coho MD 11/26/2024 07:17 AM EST RP Workstation: [id number]   DG Chest Port 1 View Result Date: 11/26/2024 EXAM: 1 VIEW XRAY OF THE CHEST 11/26/2024 06:28:43 AM COMPARISON: 11/12/2024 CLINICAL HISTORY: Shortness of breath. FINDINGS: LINES, TUBES AND DEVICES: Left internal jugular central venous catheter removed. LUNGS AND PLEURA: There is mild hazy opacification of the lung bases bilaterally, worse on the left. The left hemidiaphragm is partially obscured. There are low  lung volumes. No pleural effusion. No pneumothorax. HEART AND MEDIASTINUM: The cardiac and mediastinal silhouettes are unremarkable. There is calcification within the aortic arch. A coronary artery stent is again demonstrated. BONES AND SOFT TISSUES: No acute osseous abnormality. IMPRESSION: 1. Interval removal of the left internal jugular central venous catheter. 2. Mild hazy  opacification of the lung bases bilaterally, worse on the left, with partially obscured left hemidiaphragm and low lung volumes, favored to reflect atelectasis. Electronically signed by: Evalene Coho MD 11/26/2024 07:11 AM EST RP Workstation: [id number]   VAS US  LOWER EXTREMITY VENOUS (DVT) Result Date: 11/24/2024  Lower Venous DVT Study Patient Name:  ABRIEL GEESEY [place]  Date of Exam:   11/23/2024 Medical Rec #: [id number]       Accession #:    [id number] Date of Birth: 1941-02-06        Patient Gender: M Patient Age:   28 years Exam Location:  [facility] Procedure:      VAS US  LOWER EXTREMITY VENOUS (DVT) Referring Phys: PRANAV PATEL --------------------------------------------------------------------------------  Indications: Edema.  Comparison Study: No prior study on file. Performing Technologist: Edilia Elden Appl  Examination Guidelines: A complete evaluation includes B-mode imaging, spectral Doppler, color Doppler, and power Doppler as needed of all accessible portions of each vessel. Bilateral testing is considered an integral part of a complete examination. Limited examinations for reoccurring indications may be performed as noted. The reflux portion of the exam is performed with the patient in reverse Trendelenburg.  +---------+---------------+---------+-----------+----------+--------------+ RIGHT    CompressibilityPhasicitySpontaneityPropertiesThrombus Aging +---------+---------------+---------+-----------+----------+--------------+ CFV      Full           Yes      Yes                                 +---------+---------------+---------+-----------+----------+--------------+ SFJ      Full           Yes      Yes                                 +---------+---------------+---------+-----------+----------+--------------+ FV Prox  Full                                                        +---------+---------------+---------+-----------+----------+--------------+ FV Mid    Full                                                        +---------+---------------+---------+-----------+----------+--------------+ FV DistalFull                                                        +---------+---------------+---------+-----------+----------+--------------+ PFV      Full                                                        +---------+---------------+---------+-----------+----------+--------------+  POP      Full           Yes      Yes                                 +---------+---------------+---------+-----------+----------+--------------+ Posterior tibial vein and peroneal veins were not visualized due to wrap around bandages.  +---------+---------------+---------+-----------+----------+--------------+ LEFT     CompressibilityPhasicitySpontaneityPropertiesThrombus Aging +---------+---------------+---------+-----------+----------+--------------+ CFV      Full           Yes      Yes                                 +---------+---------------+---------+-----------+----------+--------------+ SFJ      Full           Yes      Yes                                 +---------+---------------+---------+-----------+----------+--------------+ FV Prox  Full                                                        +---------+---------------+---------+-----------+----------+--------------+ FV Mid   Full                                                        +---------+---------------+---------+-----------+----------+--------------+ FV DistalFull                                                        +---------+---------------+---------+-----------+----------+--------------+ PFV      Full                                                        +---------+---------------+---------+-----------+----------+--------------+ POP      Full           Yes      Yes                                  +---------+---------------+---------+-----------+----------+--------------+ Posterior tibial vein and peroneal veins were not visualized due to wrap around bandages.    Summary: RIGHT: - There is no evidence of deep vein thrombosis in the lower extremity. However, portions of this examination were limited- see technologist comments above.  - No cystic structure found in the popliteal fossa.  LEFT: - There is no evidence of deep vein thrombosis in the lower extremity. However, portions of this examination were limited- see technologist comments above.  - No cystic structure found in the popliteal fossa.  *See table(s) above for measurements and observations. Electronically signed by Lonni Gaskins MD on 11/24/2024  at 10:16:08 AM.    Final    US  RENAL Result Date: 11/22/2024 EXAM: RETROPERITONEAL ULTRASOUND OF THE KIDNEYS 11/22/2024 04:59:00 PM TECHNIQUE: Real-time ultrasonography of the retroperitoneum, specifically the kidneys and urinary bladder, was performed. COMPARISON: US  Renal 11/14/2024. CLINICAL HISTORY: Acute kidney injury. ICD10: [id number] Acute kidney injury. FINDINGS: RIGHT KIDNEY: Right kidney measures 11.8 x 5.0 x 5.4 cm. Calculated volume 166 ml is normal. Mild hydronephrosis is noted. Nephrostomy catheter is noted in place. LEFT KIDNEY: Left kidney measures 11.8 x 4.9 x 4.9 cm. The calculated volume is 148 ml. Mild hydronephrosis is noted. The nephrostomy catheter is noted. BLADDER: The bladder is decompressed. Note is made of bilateral pleural effusions. IMPRESSION: 1. Bilateral mild hydronephrosis with nephrostomy catheters in place. 2. Bilateral pleural effusions. Electronically signed by: Oneil Devonshire MD 11/22/2024 06:20 PM EST RP Workstation: [id number] Portable 1V Result Date: 11/20/2024 EXAM: 1 VIEW XRAY OF THE ABDOMEN 11/20/2024 12:08:00 PM COMPARISON: 11/18/2024 CLINICAL HISTORY: Ileus. ICD10: [id number] Ileus (HCC). FINDINGS: LINES, TUBES AND DEVICES: Bilateral nephrostomy tubes in  place. BOWEL: Previously seen small-bowel dilation resolved. Mild gaseous distension of the transverse colon. SOFT TISSUES: No abnormal calcifications. BONES: Degenerative changes of the lumbar spine. No acute fracture. IMPRESSION: 1. Previously seen small-bowel dilation resolved. 2. Mild gaseous distension of the transverse colon. Electronically signed by: Elsie Gravely MD 11/20/2024 04:00 PM EST RP Workstation: [id number]   IR NEPHROSTOMY PLACEMENT BILATERAL Result Date: 11/18/2024 INDICATION: 84 year old with bilateral hydronephrosis, BPH/bladder outlet obstruction and acute kidney injury. EXAM: PLACEMENT OF BILATERAL NEPHROSTOMY TUBES WITH ULTRASOUND AND FLUOROSCOPIC GUIDANCE Physician: Juliene SAUNDERS. Philip, MD COMPARISON:  Renal ultrasound 11/14/2024 MEDICATIONS: Versed  0.5 mg ANESTHESIA/SEDATION: Patient's level of consciousness and vital signs were monitored continuously by radiology nurse throughout the procedure under the supervision of the provider performing the procedure. CONTRAST:  15 mL OMNIPAQUE  IOHEXOL  300 MG/ML SOLN - administered into the collecting system(s) FLUOROSCOPY: Radiation Exposure Index (as provided by the fluoroscopic device): 6 mGy Kerma COMPLICATIONS: None immediate. PROCEDURE: The procedure was explained to the patient. The risks and benefits of the procedure were discussed and the patient's questions were addressed. Informed consent was obtained from the patient. Patient was placed prone. Both flanks were prepped and draped in sterile fashion. Maximal barrier sterile technique was utilized including caps, mask, sterile gowns, sterile gloves, sterile drape, hand hygiene and skin antiseptic. Ultrasound identified the left kidney. Skin was anesthetized with 1% lidocaine . Small incision was made. Using ultrasound guidance, a 21 gauge needle was directed into a dilated calyx in the midpole region. Contrast injection confirmed placement in the collecting system. 0.018 wire was advanced in the  collecting system. Accustick dilator set was placed. Tract was dilated over a superstiff Amplatz wire and a 10 French multipurpose drain was reconstituted in the renal pelvis. Urine was aspirated. Contrast injection confirmed placement in the renal pelvis. Drain was sutured to the skin. Drain was attached to a gravity bag. Ultrasound identified the right kidney. Skin was anesthetized with 1% lidocaine . Small incision was made. Using ultrasound guidance, a 21 gauge needle was directed into a dilated calyx in the midpole region. Contrast injection confirmed placement in the collecting system. 0.018 wire was advanced in the collecting system. Accustick dilator set was placed. Tract was dilated over a superstiff Amplatz wire and a 10 French multipurpose drain was reconstituted in the renal pelvis. Urine was aspirated. Contrast injection confirmed placement in the renal pelvis. Drain was sutured to the skin. Drain was attached to a gravity  bag. Fluoroscopic and ultrasound images were taken and saved for documentation. FINDINGS: Ultrasound demonstrated moderate bilateral hydronephrosis. Left nephrostomy tube is reconstituted in left renal pelvis. Right nephrostomy tube is reconstituted in the right renal pelvis. IMPRESSION: Successful placement of bilateral percutaneous nephrostomy tubes using ultrasound and fluoroscopic guidance. Electronically Signed   By: Juliene Balder M.D.   On: 11/18/2024 16:49   DG Abd Portable 1V Result Date: 11/18/2024 CLINICAL DATA:  Small bowel obstruction. EXAM: PORTABLE ABDOMEN - 1 VIEW COMPARISON:  Radiograph 11/16/2024, CT 11/11/2024 FINDINGS: Bilateral nephrostomy tubes in place. Gaseous distension of large and small bowel. No significant formed colonic stool. No evidence of free air in the supine views. IMPRESSION: Gaseous distension of large and small bowel, favor ileus. Electronically Signed   By: Andrea Gasman M.D.   On: 11/18/2024 13:42   DG Abd 1 View Result Date:  11/16/2024 EXAM: 1 VIEW XRAY OF THE ABDOMEN 11/16/2024 12:32:00 AM COMPARISON: 11/11/2024 CT CLINICAL HISTORY: Distended abdomen. FINDINGS: BOWEL: Scattered large and small bowel gas is noted. Dilatation of the small bowel is noted consistent with at least a partial small bowel obstruction. No definitive free air is seen. SOFT TISSUES: No abnormal calcifications. BONES: Degenerative changes of the lumbar spine are noted. No acute fracture. IMPRESSION: 1. Dilatation of the small bowel consistent with at least a partial small bowel obstruction. These findings are new from the prior CT examination. 2. No definitive free air. Electronically signed by: Oneil Devonshire MD 11/16/2024 12:38 AM EST RP Workstation: [id number]   US  RENAL Result Date: 11/14/2024 EXAM: RETROPERITONEAL ULTRASOUND OF THE KIDNEYS 11/14/2024 08:34:00 AM TECHNIQUE: Real-time ultrasonography of the retroperitoneum, specifically the kidneys and urinary bladder, was performed. COMPARISON: CT and ultrasound dated 11/11/2024. CLINICAL HISTORY: Bilateral hydronephrosis. FINDINGS: RIGHT KIDNEY: Right kidney measures 13.2 x 5.8 x 5.5 cm. Unchanged bilateral lobulated kidneys with cortical thinning. Severe hydronephrosis, similar in appearance to prior study. No calculus. No mass. LEFT KIDNEY: Left kidney measures 11.5 x 5.5 x 5.6 cm. Unchanged bilateral lobulated kidneys with cortical thinning. Severe hydronephrosis, similar in appearance to prior study. No calculus. No mass. BLADDER: Limited evaluation of the bladder as the bladder is decompressed with a foley catheter in place. Partially imaged small right pleural effusion and trace perihepatic free fluid. IMPRESSION: 1. Severe bilateral hydronephrosis, similar to prior study. 2. Partially imaged right pleural effusion and trace perihepatic free fluid. Electronically signed by: Michaeline Blanch MD 11/14/2024 10:21 AM EST RP Workstation: [id number]   DG CHEST PORT 1 VIEW Result Date: 11/12/2024 CLINICAL DATA:   Central line placement. EXAM: PORTABLE CHEST 1 VIEW COMPARISON:  Earlier film, same date. FINDINGS: The right sided catheter has been removed. The left IJ catheter tip is in the region of the brachiocephalic SVC junction. No complicating features are identified. Stable heart and lungs. No acute findings. IMPRESSION: Left IJ catheter tip in the region of the brachiocephalic SVC junction. No complicating features. Electronically Signed   By: MYRTIS Stammer M.D.   On: 11/12/2024 18:29   DG CHEST PORT 1 VIEW Result Date: 11/12/2024 EXAM: 1 VIEW(S) XRAY OF THE CHEST 11/12/2024 04:30:00 PM COMPARISON: 11/11/2024 CLINICAL HISTORY: Encounter for central line placement [id number] FINDINGS: LINES, TUBES AND DEVICES: Right neck central line in place with tip within the region of the right axillary vein in the right axilla. LUNGS AND PLEURA: Mild left basilar atelectasis. No pleural effusion. No pneumothorax. HEART AND MEDIASTINUM: Aortic arch calcifications. Coronary stent noted. No acute abnormality of the cardiac and mediastinal silhouettes. BONES  AND SOFT TISSUES: No acute osseous abnormality. IMPRESSION: 1. Right IJ approach central line tip within the region of the right axillary vein. Repositioning recommended. No pneumothorax. These results will be called to the ordering clinician or representative by the radiologist assistant and communication documented in the pacs or clario dashboard. Electronically signed by: Rogelia Myers MD 11/12/2024 04:44 PM EST RP Workstation: [id number]   ECHOCARDIOGRAM COMPLETE Result Date: 11/12/2024    ECHOCARDIOGRAM REPORT   Patient Name:   SHAHEIM MAHAR [facility] Date of Exam: 11/12/2024 Medical Rec #:  [id number]      Height:       65.0 in Accession #:    [id number]     Weight:       166.9 lb Date of Birth:  1941-07-28       BSA:          1.832 m Patient Age:    84 years       BP:           87/64 mmHg Patient Gender: M              HR:           138 bpm. Exam Location:  Inpatient Procedure: 2D  Echo, Color Doppler and Cardiac Doppler (Both Spectral and Color            Flow Doppler were utilized during procedure). Indications:    Shock  History:        Patient has prior history of Echocardiogram examinations, most                 recent 07/13/2021. CHF, CAD and Previous Myocardial Infarction,                 Signs/Symptoms:Edema and Murmur; Risk Factors:Hypertension,                 Diabetes, Dyslipidemia and Former Smoker.  Sonographer:    Juliene Rucks Referring Phys: [id number] PEYTON CROZIER  Sonographer Comments: Image acquisition challenging due to respiratory motion. IMPRESSIONS  1. Apical akinesis with overall preserved LV function.  2. Left ventricular ejection fraction, by estimation, is 55 to 60%. The left ventricle has normal function. The left ventricle demonstrates regional wall motion abnormalities (see scoring diagram/findings for description). Left ventricular diastolic parameters are indeterminate.  3. Right ventricular systolic function is normal. The right ventricular size is normal.  4. The mitral valve is normal in structure. No evidence of mitral valve regurgitation. No evidence of mitral stenosis.  5. The aortic valve has an indeterminant number of cusps. Aortic valve regurgitation is not visualized. No aortic stenosis is present. FINDINGS  Left Ventricle: Left ventricular ejection fraction, by estimation, is 55 to 60%. The left ventricle has normal function. The left ventricle demonstrates regional wall motion abnormalities. The left ventricular internal cavity size was normal in size. There is no left ventricular hypertrophy. Left ventricular diastolic parameters are indeterminate. Right Ventricle: The right ventricular size is normal. Right ventricular systolic function is normal. Left Atrium: Left atrial size was normal in size. Right Atrium: Right atrial size was normal in size. Pericardium: There is no evidence of pericardial effusion. Mitral Valve: The mitral valve is normal in  structure. Mild mitral annular calcification. No evidence of mitral valve regurgitation. No evidence of mitral valve stenosis. Tricuspid Valve: The tricuspid valve is normal in structure. Tricuspid valve regurgitation is trivial. No evidence of tricuspid stenosis. Aortic Valve: The aortic valve has an indeterminant number of cusps. Aortic valve  regurgitation is not visualized. No aortic stenosis is present. Pulmonic Valve: The pulmonic valve was normal in structure. Pulmonic valve regurgitation is not visualized. No evidence of pulmonic stenosis. Aorta: The aortic root is normal in size and structure. Venous: The inferior vena cava was not well visualized. IAS/Shunts: The interatrial septum was not well visualized. Additional Comments: Apical akinesis with overall preserved LV function.  LEFT VENTRICLE PLAX 2D LVIDd:         4.70 cm   Diastology LVIDs:         3.70 cm   LV e' medial:    8.70 cm/s LV PW:         1.10 cm   LV E/e' medial:  10.1 LV IVS:        0.90 cm   LV e' lateral:   15.10 cm/s LVOT diam:     2.20 cm   LV E/e' lateral: 5.8 LV SV:         81 LV SV Index:   44 LVOT Area:     3.80 cm  RIGHT VENTRICLE RV Basal diam:  2.80 cm RV Mid diam:    2.50 cm TAPSE (M-mode): 1.4 cm LEFT ATRIUM             Index LA Vol (A2C):   57.9 ml 31.61 ml/m LA Vol (A4C):   51.6 ml 28.17 ml/m LA Biplane Vol: 54.8 ml 29.92 ml/m  AORTIC VALVE LVOT Vmax:   132.00 cm/s LVOT Vmean:  105.000 cm/s LVOT VTI:    0.213 m  AORTA Ao Root diam: 2.90 cm MITRAL VALVE MV Area (PHT): 3.89 cm    SHUNTS MV Decel Time: 195 msec    Systemic VTI:  0.21 m MV E velocity: 88.30 cm/s  Systemic Diam: 2.20 cm Redell Shallow MD Electronically signed by Redell Shallow MD Signature Date/Time: 11/12/2024/1:14:22 PM    Final    US  RENAL Result Date: 11/11/2024 CLINICAL DATA:  Renal failure EXAM: RENAL / URINARY TRACT ULTRASOUND COMPLETE COMPARISON:  11/11/2024 FINDINGS: Right Kidney: Renal measurements: 13.0 x 7.7 by 4.7 cm = volume: 248.2 mL. Moderate  renal cortical thinning. Normal echotexture. Severe hydronephrosis similar to recent CT. Left Kidney: Renal measurements: 9.3 x 5.0 x 4.1 cm = volume: 101.3 mL. Moderate renal cortical atrophy. Normal renal cortical echotexture. Severe hydronephrosis similar to recent CT. Bladder: The bladder is decompressed by Foley catheter, with limited visualization. Marked bladder wall thickening and trabeculation were seen on preceding CT. Other: None. IMPRESSION: 1. Moderate bilateral renal cortical thinning. 2. Severe bilateral hydronephrosis, likely due to chronic bladder outlet obstruction from an enlarged prostate. 3. Nonvisualization of the bladder due to decompression by Foley catheter. Please refer to recent CT exam for bladder findings. Electronically Signed   By: [name] Daring M.D.   On: 11/11/2024 16:18   CT Head Wo Contrast Result Date: 11/11/2024 CLINICAL DATA:  Head trauma EXAM: CT HEAD WITHOUT CONTRAST TECHNIQUE: Contiguous axial images were obtained from the base of the skull through the vertex without intravenous contrast. RADIATION DOSE REDUCTION: This exam was performed according to the departmental dose-optimization program which includes automated exposure control, adjustment of the mA and/or kV according to patient size and/or use of iterative reconstruction technique. COMPARISON:  04/11/2007 FINDINGS: Brain: Encephalomalacia related to prior right parietal infarct. No evidence of acute infarct or hemorrhage. Lateral ventricles and midline structures are unremarkable. No acute extra-axial fluid collections. No mass effect. Vascular: No hyperdense vessel or unexpected calcification. Skull: Normal. Negative for fracture or  focal lesion. Sinuses/Orbits: No acute finding. Other: None. IMPRESSION: 1. Chronic right parietal infarct. 2. No acute intracranial process. Electronically Signed   By: [name] Daring M.D.   On: 11/11/2024 14:58   CT ABDOMEN PELVIS WO CONTRAST Result Date: 11/11/2024 CLINICAL  DATA:  Fall, abdominal trauma, evaluate for retroperitoneal hematoma. EXAM: CT ABDOMEN AND PELVIS WITHOUT CONTRAST TECHNIQUE: Multidetector CT imaging of the abdomen and pelvis was performed following the standard protocol without IV contrast. RADIATION DOSE REDUCTION: This exam was performed according to the departmental dose-optimization program which includes automated exposure control, adjustment of the mA and/or kV according to patient size and/or use of iterative reconstruction technique. COMPARISON:  None Available. FINDINGS: Lower chest: Possible mild basilar interstitial lung abnormality. Atherosclerotic calcification of the aorta, aortic valve and coronary arteries. Heart is mildly enlarged. No pericardial or pleural effusion. Distal esophagus is grossly unremarkable. Hepatobiliary: Liver and gallbladder are unremarkable. No biliary ductal dilatation. Pancreas: Negative. Spleen: Negative. Adrenals/Urinary Tract: Adrenal glands are unremarkable. Severe bilateral hydronephrosis. Marked bladder wall thickening. Foley catheter and air in the bladder which is largely decompressed. Somewhat loculated perivesical fluid associated stranding. Stomach/Bowel: Stomach, small bowel, appendix and colon are unremarkable. Vascular/Lymphatic: Atherosclerotic calcification of the aorta. No pathologically enlarged lymph nodes. Reproductive: Enlarged prostate. Other: No free fluid. Musculoskeletal: Degenerative changes in the spine. No fracture. Mild levoconvex scoliosis. IMPRESSION: 1. Somewhat loculated appearing fluid around the bladder with inflammatory haziness and stranding, findings which may be due to trauma/contusion. Alternatively, given bladder wall thickening and an enlarged prostate, an element of outlet obstruction with associated cystitis could have this appearance. 2. Associated severe bilateral hydronephrosis. 3. Otherwise, no evidence of acute trauma. 4. Suspect mild basilar interstitial lung abnormality  (ILA). Electronically Signed   By: Newell Eke M.D.   On: 11/11/2024 14:53   DG Chest Portable 1 View Result Date: 11/11/2024 CLINICAL DATA:  Weakness. EXAM: PORTABLE CHEST 1 VIEW COMPARISON:  04/11/2007 FINDINGS: Lungs are adequately inflated and otherwise clear. Cardiomediastinal silhouette and remainder of the exam is unchanged. IMPRESSION: No active disease. Electronically Signed   By: Toribio Agreste M.D.   On: 11/11/2024 13:47    Micro Results    No results found for this or any previous visit (from the past 240 hours).  Today   Subjective    Milissa Silvius today has no headache,no chest abdominal pain,no new weakness tingling or numbness, feels much better    Objective   Blood pressure (!) 118/59, pulse 83, temperature 98 F (36.7 C), temperature source Oral, resp. rate 18, height 5' 5 (1.651 m), weight 94.4 kg, SpO2 100%.   Intake/Output Summary (Last 24 hours) at 12/02/2024 0911 Last data filed at 12/02/2024 0618 Gross per 24 hour  Intake --  Output 2000 ml  Net -2000 ml    Exam  Awake Alert, No new F.N deficits,    [place].AT,PERRAL Supple Neck,   Symmetrical Chest wall movement, Good air movement bilaterally, CTAB RRR,No Gallops,   +ve B.Sounds, Abd Soft, Non tender, bilateral nephrostomy tubes in place No Cyanosis, Clubbing or edema    Data Review   Recent Labs  Lab 11/26/24 0337 11/27/24 0559 11/28/24 0516 11/29/24 0915 12/02/24 0315  WBC 5.2 4.2 4.5 4.6 6.3  HGB 7.4* 7.0* 8.4* 9.5* 8.3*  HCT 22.9* 21.7* 25.1* 28.5* 25.1*  PLT 226 230 225 248 261  MCV 98.3 98.6 95.4 95.6 95.8  MCH 31.8 31.8 31.9 31.9 31.7  MCHC 32.3 32.3 33.5 33.3 33.1  RDW 17.2* 17.0* 17.7*  16.6* 15.7*  LYMPHSABS 0.6* 0.6* 0.6* 0.5* 0.7  MONOABS 0.7 0.5 0.6 0.5 0.6  EOSABS 0.3 0.4 0.5 0.6* 0.9*  BASOSABS 0.0 0.0 0.0 0.0 0.0    Recent Labs  Lab 11/26/24 0337 11/27/24 0559 11/28/24 0516 11/29/24 0915 11/29/24 0916 11/30/24 0756 12/01/24 0704 12/02/24 0315  NA 136 134* 135   --  134* 136 137 137  K 5.4* 5.6* 5.4*  --  4.7 4.5 4.1 4.1  CL 106 103 101  --  100 100 99 98  CO2 22 19* 21*  --  20* 23 25 27   ANIONGAP 9 12 12   --  15 13 13 12   GLUCOSE 113* 93 89  --  120* 107* 107* 102*  BUN 92* 95* 95*  --  95* 93* 93* 93*  CREATININE 4.70* 4.55* 4.44*  --  4.63* 4.71* 4.67* 4.51*  ALBUMIN  2.6* 2.2*  --   --   --  2.6*  --   --   TSH  --  4.420  --   --   --   --   --   --   MG 2.0 1.9 1.8 1.7  --   --   --  1.6*  PHOS 2.8 3.5  --  5.1*  --  5.6*  --  6.2*  CALCIUM  7.8* 7.6* 7.9*  --  8.2* 8.2* 8.0* 7.9*    Total Time in preparing paper work, data evaluation and todays exam - 35 minutes  Signature  -    Lavada Stank M.D on 12/02/2024 at 9:11 AM   -  To page go to [email or web address]      "

## 2024-12-02 NOTE — Progress Notes (Signed)
 [facility] NEPHROLOGY PROGRESS NOTE  Assessment/ Plan: Pt is a 84 y.o. yo male with past medical history significant for HTN, HLD, CHF with preserved EF, CAD status post stent, seen by our team for AKI thought to be due to obstructive uropathy, signed off on 1/24, called us  back by primary team because of intermittent hyperkalemia and persistent AKI.  # Acute kidney injury due to obstructive uropathy: Unclear baseline.  Patient was admitted to ICU for shock requiring pressor in the beginning, may have some component of ATN as well.  Seen by urology and IR, status post bilateral nephrostomy tube placement.  Patient is nonoliguric.  The peak creatinine level was 14 on admission and now it is holding around 4-5.  No overt uremic symptoms. - Patient has expressed in the past that he does not want dialysis.  When asked if he would want dialysis if it was decision between life and death he is not sure.  Fortunately do not need to answer this question right now but should continue to discuss this with his family. - Only time will tell if creatinine continues to improve.   - discharging to inpatient rehab today.  Recommend to get labs weekly for 4 weeks.  Wean off torsemide  as able - Family to call our office when they discharged to set up follow-up.  # Hyperkalemia: Now improved with treatment of AKI  # Volume overload: Significant edema on exam.  Did not have edema before he came to the hospital.  Likely associated with AKI.  Good response to torsemide .  Continue 40 mg twice daily.  May be able to wean off with time  # Anemia due to acute critical illness, no sign of bleeding: Transfuse as needed  # Shock: Due to UTI and hypovolemia.  Previously on midodrine  now stopped  # Metabolic acidosis: Resolved.  Off of bicarbonate  Discussed with the patient's son and daughter who are present at the bedside.  Subjective: Patient continues to feel fairly well with some shortness of breath but  overall improved edema.  Planning to discharge to CIR today.  Objective Vital signs in last 24 hours: Vitals:   12/02/24 0000 12/02/24 0400 12/02/24 0700 12/02/24 0800  BP: 128/63 (!) 118/59  112/62  Pulse: 86 83  79  Resp: 18 18  14   Temp: 98.7 F (37.1 C) 97.8 F (36.6 C)  98 F (36.7 C)  TempSrc: Oral Oral Oral Oral  SpO2: 100% 100%  93%  Weight:      Height:       Weight change:   Intake/Output Summary (Last 24 hours) at 12/02/2024 1207 Last data filed at 12/02/2024 0941 Gross per 24 hour  Intake 250 ml  Output 2550 ml  Net -2300 ml       Labs: RENAL PANEL Recent Labs  Lab 11/26/24 9662 11/27/24 0559 11/28/24 0516 11/29/24 0915 11/29/24 0916 11/30/24 0756 12/01/24 0704 12/02/24 0315  NA 136 134* 135  --  134* 136 137 137  K 5.4* 5.6* 5.4*  --  4.7 4.5 4.1 4.1  CL 106 103 101  --  100 100 99 98  CO2 22 19* 21*  --  20* 23 25 27   GLUCOSE 113* 93 89  --  120* 107* 107* 102*  BUN 92* 95* 95*  --  95* 93* 93* 93*  CREATININE 4.70* 4.55* 4.44*  --  4.63* 4.71* 4.67* 4.51*  CALCIUM  7.8* 7.6* 7.9*  --  8.2* 8.2* 8.0* 7.9*  MG  2.0 1.9 1.8 1.7  --   --   --  1.6*  PHOS 2.8 3.5  --  5.1*  --  5.6*  --  6.2*  ALBUMIN  2.6* 2.2*  --   --   --  2.6*  --   --     Liver Function Tests: Recent Labs  Lab 11/26/24 0337 11/27/24 0559 11/30/24 0756  ALBUMIN  2.6* 2.2* 2.6*   No results for input(s): LIPASE, AMYLASE in the last 168 hours. No results for input(s): AMMONIA in the last 168 hours. CBC: Recent Labs    11/11/24 1643 11/12/24 0500 11/26/24 0337 11/27/24 0559 11/28/24 0516 11/29/24 0915 12/02/24 0315  HGB 7.7*   < > 7.4* 7.0* 8.4* 9.5* 8.3*  MCV 97.9   < > 98.3 98.6 95.4 95.6 95.8  VITAMINB12 1,349*  --   --   --   --   --   --   FOLATE 7.7  --   --   --   --   --   --   FERRITIN 938*  --   --   --   --   --   --   TIBC 121*  --   --   --   --   --   --   IRON 102  --   --   --   --   --   --   RETICCTPCT 2.8  --   --   --   --   --   --    <  > = values in this interval not displayed.    Cardiac Enzymes: No results for input(s): CKTOTAL, CKMB, CKMBINDEX, TROPONINI in the last 168 hours. CBG: Recent Labs  Lab 12/01/24 0744 12/01/24 1228 12/01/24 1625 12/01/24 2050 12/02/24 0810  GLUCAP 115* 134* 170* 138* 104*    Iron Studies: No results for input(s): IRON, TIBC, TRANSFERRIN, FERRITIN in the last 72 hours. Studies/Results: No results found.   Medications: Infusions:   Scheduled Medications:  Chlorhexidine  Gluconate Cloth  6 each Topical Daily   Gerhardt's butt cream  1 Application Topical TID   mirtazapine   15 mg Oral QHS   multivitamin  1 tablet Oral QHS   pantoprazole   40 mg Oral BID   saccharomyces boulardii  250 mg Oral BID   simethicone   80 mg Oral QID   sodium chloride  flush  5 mL Intracatheter Q12H   torsemide   40 mg Oral BID    have reviewed scheduled and prn medications.  Physical Exam: General:NAD, comfortable Heart: Normal rate, no rub Lungs: Chest rise with no increased work of breathing Abdomen:soft, Non-tender, non-distended GU: B/l nephrostomy bag with clear urine.  Extremities: 1+ pitting edema in all 4 extremities Neurology: Alert, awake, oriented  [facility] 12/02/2024,12:07 PM  LOS: 21 days

## 2024-12-02 NOTE — Plan of Care (Signed)

## 2024-12-02 NOTE — TOC Transition Note (Cosign Needed)
 Transition of Care [facility]) - Discharge Note   Patient Details  Name: [name] MRN: [id number] Date of Birth: [birth year]  Transition of [facility] CM/SW Contact:  Sharyne Drum, Student-Social Work Phone Number: 12/02/2024, 9:58 AM   Clinical Narrative:     Patient will DC to: [facility]  Anticipated DC date:  Family notified:12/02/2024 Transport by: [facility]   Per MD patient ready for DC to [facility] . RN to call report prior to discharge [phone]. RN, patient, patient's family, and facility notified of DC. Discharge Summary and FL2 sent to facility. DC packet on chart. Ambulance transport requested for patient.   CSW will sign off for now as social work intervention is no longer needed. Please consult us  again if new needs arise.   Final next level of care: Skilled Nursing Facility Barriers to Discharge: Barriers Resolved   Patient Goals and CMS Choice Patient states their goals for this hospitalization and ongoing recovery are:: SNF          Discharge Placement   Existing PASRR number confirmed : 12/02/24          Patient chooses bed at: [facility] Patient to be transferred to facility by: [facility] Name of family member notified: [name], [name], Emergency Contact  [phone] Patient and family notified of of transfer: 12/02/24  Discharge Plan and Services Additional resources added to the After Visit Summary for   In-house Referral: Clinical Social Work                                   Social Drivers of Health (SDOH) Interventions SDOH Screenings   Food Insecurity: No Food Insecurity (11/11/2024)  Housing: Low Risk (11/11/2024)  Transportation Needs: No Transportation Needs (11/11/2024)  Utilities: Not At Risk (11/11/2024)  Social Connections: Moderately Integrated (11/11/2024)  Tobacco Use: Medium Risk (11/11/2024)     Readmission Risk Interventions     No data to display

## 2024-12-04 LAB — ZINC: Zinc: 46 ug/dL (ref 44–115)
